# Patient Record
Sex: Female | Born: 1945
Health system: Southern US, Community
[De-identification: ages and names within clinical notes are randomized; demographics above are authoritative.]

## PROBLEM LIST (undated history)

## (undated) DIAGNOSIS — E785 Hyperlipidemia, unspecified: Secondary | ICD-10-CM

## (undated) DIAGNOSIS — K259 Gastric ulcer, unspecified as acute or chronic, without hemorrhage or perforation: Secondary | ICD-10-CM

## (undated) DIAGNOSIS — I10 Essential (primary) hypertension: Secondary | ICD-10-CM

## (undated) DIAGNOSIS — J329 Chronic sinusitis, unspecified: Secondary | ICD-10-CM

## (undated) DIAGNOSIS — B7300 Onchocerciasis with eye involvement, unspecified: Secondary | ICD-10-CM

## (undated) HISTORY — PX: BREAST LUMPECTOMY: SHX2

## (undated) HISTORY — PX: APPENDECTOMY: SHX54

## (undated) HISTORY — DX: Hyperlipidemia, unspecified: E78.5

## (undated) HISTORY — DX: Gastric ulcer, unspecified as acute or chronic, without hemorrhage or perforation: K25.9

---

## 2015-03-09 ENCOUNTER — Ambulatory Visit: Payer: Self-pay | Attending: Internal Medicine

## 2015-07-11 ENCOUNTER — Ambulatory Visit: Payer: Self-pay | Attending: Family Medicine

## 2015-08-30 ENCOUNTER — Encounter (HOSPITAL_COMMUNITY): Payer: Self-pay | Admitting: *Deleted

## 2015-08-30 ENCOUNTER — Emergency Department (INDEPENDENT_AMBULATORY_CARE_PROVIDER_SITE_OTHER)
Admission: EM | Admit: 2015-08-30 | Discharge: 2015-08-30 | Disposition: A | Discharge status: Home / Self Care | Payer: No Typology Code available for payment source | Source: Home / Self Care | Attending: Emergency Medicine | Admitting: Emergency Medicine

## 2015-08-30 DIAGNOSIS — I10 Essential (primary) hypertension: Secondary | ICD-10-CM

## 2015-08-30 HISTORY — DX: Onchocerciasis with eye involvement, unspecified: B73.00

## 2015-08-30 HISTORY — DX: Essential (primary) hypertension: I10

## 2015-08-30 HISTORY — DX: Chronic sinusitis, unspecified: J32.9

## 2015-08-30 LAB — POCT I-STAT, CHEM 8
BUN: 12 mg/dL (ref 6–20)
CHLORIDE: 105 mmol/L (ref 101–111)
CREATININE: 0.9 mg/dL (ref 0.44–1.00)
Calcium, Ion: 1.15 mmol/L (ref 1.13–1.30)
Glucose, Bld: 110 mg/dL — ABNORMAL HIGH (ref 65–99)
HEMATOCRIT: 47 % — AB (ref 36.0–46.0)
HEMOGLOBIN: 16 g/dL — AB (ref 12.0–15.0)
POTASSIUM: 3.5 mmol/L (ref 3.5–5.1)
Sodium: 143 mmol/L (ref 135–145)
TCO2: 27 mmol/L (ref 0–100)

## 2015-08-30 MED ORDER — CLONIDINE HCL 0.1 MG PO TABS
ORAL_TABLET | ORAL | Status: AC
  Filled 2015-08-30: qty 1

## 2015-08-30 MED ORDER — CLONIDINE HCL 0.1 MG PO TABS
0.1000 mg | ORAL_TABLET | Freq: Once | ORAL | Status: AC
  Administered 2015-08-30: 0.1 mg via ORAL

## 2015-08-30 MED ORDER — AMLODIPINE BESYLATE 10 MG PO TABS: 10.0000 mg | ORAL_TABLET | Freq: Every day | ORAL | Status: DC

## 2015-08-30 NOTE — Discharge Instructions (Signed)
I am adjusting your blood pressure medicines. Please continue to take the enalapril daily.  I think the clonidine as causing your palpitations, the jogging of your heart. Take your regular dose tonight. Starting tomorrow, take half a tablet twice a day for 1 week, then half a tablet once a day for a week, then stop.  Please start amlodipine 1 pill daily.  Continue to monitor your blood pressure at home. If it is greater than 180/110 most of the time, please come back. If your heart rate drops below 50, please come back.  If at any time you develop chest pain, the shaking of your heart gets worse, you develop a really bad headache, or you have trouble with your vision, please go to the emergency room.

## 2015-08-30 NOTE — ED Notes (Signed)
Pt reports she recently started taking clonidine about 4 months in addition to enalipril.  Since taking the clonidine she has felt "shaking" in her chest especially when lying on her left side,    She has a headache the last 2 days

## 2015-08-30 NOTE — ED Provider Notes (Signed)
CSN: [id number]     Arrival date & time 08/30/15  1317 History   First MD Initiated Contact with Patient 08/30/15 1436     Chief Complaint  Patient presents with  . Medication Refill   (Consider location/radiation/quality/duration/timing/severity/associated sxs/prior Treatment) HPI  She is a 70 year old woman here for medication issue. She states she has been on blood pressure medication for at least 30 years. Her current medications include enalapril 20 mg daily and clonidine. The clonidine was started several months ago and has gradually been increased in dosage. Currently she is taking it twice a day, the dose is unclear but I suspect she is on 0.1 mg twice a day. She states that when she started the clonidine she developed brief episodes of her heart "jogging." These lasted a few seconds to minutes and would resolve spontaneously. When the clonidine dosage increased to twice a day, the palpitations got worse. Today, she has not taken her clonidine as she has noticed her pulse rate has been dropping which makes her uncomfortable. She does record her blood pressure and pulse at home. Blood pressure readings are all greater than 160/80. Pulse rate ranges from 55-69. She states the jogging of her heart gets worse when she lies down, particularly on the left side.  She does report some headaches. No shortness of breath or chest pain.  Past Medical History  Diagnosis Date  . Hypertension   . River blindness    Past Surgical History  Procedure Laterality Date  . Breast lumpectomy    . Appendectomy     No family history on file. Social History  Substance Use Topics  . Smoking status: Never Smoker   . Smokeless tobacco: None  . Alcohol Use: No   OB History    No data available     Review of Systems As in history of present illness Allergies  Augmentin  Home Medications   Prior to Admission medications   Medication Sig Start Date End Date Taking? Authorizing Provider  aspirin 81 MG  tablet Take 81 mg by mouth daily.   Yes Historical Provider, MD  cloNIDine (CATAPRES) 0.1 MG tablet Take 0.1 mg by mouth 2 (two) times daily.   Yes Historical Provider, MD  enalapril (VASOTEC) 20 MG tablet Take 20 mg by mouth daily.   Yes Historical Provider, MD  amLODipine (NORVASC) 10 MG tablet Take 1 tablet (10 mg total) by mouth daily. 08/30/15   Melony Overly, MD   Meds Ordered and Administered this Visit   Medications  cloNIDine (CATAPRES) tablet 0.1 mg (not administered)    BP 245/107 mmHg  Pulse 110  Temp(Src) 99.4 F (37.4 C) (Oral)  Resp 24  SpO2 100% No data found.   Physical Exam  Constitutional: She is oriented to person, place, and time. She appears well-developed and well-nourished. No distress.  Cardiovascular: Regular rhythm.   Murmur (1/6 systolic murmur at left upper sternal border) heard. Tachycardic  Pulmonary/Chest: Effort normal and breath sounds normal. No respiratory distress. She has no wheezes. She has no rales.  Neurological: She is alert and oriented to person, place, and time.    ED Course  Procedures (including critical care time) ED ECG REPORT   Date: 08/30/2015  Rate: 100  Rhythm: normal sinus rhythm  QRS Axis: normal  Intervals: normal  ST/T Wave abnormalities: normal  Conduction Disutrbances:none  Narrative Interpretation: NSR, normal EKG  Old EKG Reviewed: none available  I have personally reviewed the EKG tracing and agree with the  computerized printout as noted.  Labs Review Labs Reviewed  POCT I-STAT, CHEM 8 - Abnormal; Notable for the following:    Glucose, Bld 110 (*)    Hemoglobin 16.0 (*)    HCT 47.0 (*)    All other components within normal limits    Imaging Review No results found.    MDM   1. Essential hypertension    Her blood pressure is quite elevated. I suspect this is rebound hypertension as she did not take her clonidine this morning. We will give her clonidine 0.1 mg here prior to discharge. I suspect  her palpitations are secondary to clonidine given that they started after starting the medication and worsened when the dose increased. I have provided her instructions on slowly tapering the clonidine. She will continue her enalapril. I have added amlodipine 10 mg daily. I have given her parameters that warrant return to the urgent care as well as reasons to go to the emergency room.  Melony Overly, MD 08/30/15 [phone]

## 2015-08-31 ENCOUNTER — Telehealth (HOSPITAL_COMMUNITY): Payer: Self-pay | Admitting: Emergency Medicine

## 2015-08-31 NOTE — ED Notes (Signed)
Returned pt's call... Pt reports she forgot to mention a problem to the doctor yest and wanted to know if we can call in a Rx to help her sleep.  Notified pt that Dr. Bridgett Larsson is not here... Adv pt to f/u w/her PCP for this as well and to return if not getting any better A&O x4... No acute distress.

## 2015-09-07 ENCOUNTER — Telehealth (HOSPITAL_COMMUNITY): Payer: Self-pay | Admitting: Emergency Medicine

## 2015-09-07 NOTE — ED Notes (Signed)
Pt reports she's still feeling dizzy and having palpitations.... Per Dr. Bridgett Larsson, pt is to come in or f/u w/PCP Adv pt to come... Reports she can't come in today due to lack of transportation and will be here tomorrow Adv pt to call EMS and go to ER if sx are getting worse Pt verb understanding.

## 2015-09-08 ENCOUNTER — Emergency Department (HOSPITAL_COMMUNITY)
Admission: EM | Admit: 2015-09-08 | Discharge: 2015-09-08 | Disposition: A | Discharge status: Home / Self Care | Payer: No Typology Code available for payment source | Source: Home / Self Care

## 2015-09-24 ENCOUNTER — Emergency Department (INDEPENDENT_AMBULATORY_CARE_PROVIDER_SITE_OTHER)
Admission: EM | Admit: 2015-09-24 | Discharge: 2015-09-24 | Disposition: A | Discharge status: Home / Self Care | Payer: No Typology Code available for payment source | Source: Home / Self Care | Attending: Family Medicine | Admitting: Family Medicine

## 2015-09-24 ENCOUNTER — Encounter (HOSPITAL_COMMUNITY): Payer: Self-pay | Admitting: Emergency Medicine

## 2015-09-24 DIAGNOSIS — Z8679 Personal history of other diseases of the circulatory system: Secondary | ICD-10-CM

## 2015-09-24 DIAGNOSIS — Z87898 Personal history of other specified conditions: Secondary | ICD-10-CM

## 2015-09-24 MED ORDER — TRAZODONE HCL 50 MG PO TABS: 50.0000 mg | ORAL_TABLET | Freq: Every day | ORAL | Status: DC

## 2015-09-24 NOTE — ED Provider Notes (Signed)
CSN: [id number]     Arrival date & time 09/24/15  1601 History   First MD Initiated Contact with Patient 09/24/15 1743     Chief Complaint  Patient presents with  . Palpitations   (Consider location/radiation/quality/duration/timing/severity/associated sxs/prior Treatment) HPI patient presents with complaint of palpitations due to her medications. Patient states that she saw one of the urgent care doctors here who adjusted her medication and she is very appreciative of that but she still has symptoms and would like to have something to help her sleep at night. Patient states that she is also having some psychological issues because she has lost her green card and she is afraid that she will be deported. States that she has had multiple losses and her family due to war in her country. And does not wish to return. She states that she is not happy with her primary care provider because she does not feel that she listens to her. She is requesting a sleeping pill at this time. She is also asking to see the urgent care physician Cared for her previously. Patient describes her palpitations as "her heart is jogging" does not give her chest pain or shortness of breath. She has had her medication adjusted and has been weaned off her clonidine which she finished February 16th by patient's report.  Past Medical History  Diagnosis Date  . Hypertension   . River blindness   . Sinusitis    Past Surgical History  Procedure Laterality Date  . Breast lumpectomy    . Appendectomy     History reviewed. No pertinent family history. Social History  Substance Use Topics  . Smoking status: Never Smoker   . Smokeless tobacco: None  . Alcohol Use: No   OB History    No data available     Review of Systems palpitations Allergies  Augmentin  Home Medications   Prior to Admission medications   Medication Sig Start Date End Date Taking? Authorizing Provider  amLODipine (NORVASC) 10 MG tablet Take 1 tablet  (10 mg total) by mouth daily. 08/30/15  Yes Melony Overly, MD  aspirin 81 MG tablet Take 81 mg by mouth daily.   Yes Historical Provider, MD  enalapril (VASOTEC) 20 MG tablet Take 20 mg by mouth daily.   Yes Historical Provider, MD  cloNIDine (CATAPRES) 0.1 MG tablet Take 0.1 mg by mouth 2 (two) times daily.    Historical Provider, MD  traZODone (DESYREL) 50 MG tablet Take 1 tablet (50 mg total) by mouth at bedtime. 09/24/15   Konrad Felix, PA   Meds Ordered and Administered this Visit  Medications - No data to display  BP 187/82 mmHg  Pulse 109  Temp(Src) 98.4 F (36.9 C) (Oral)  Resp 18  SpO2 98% No data found.   Physical Exam NURSES NOTES AND VITAL SIGNS REVIEWED. CONSTITUTIONAL: Well developed, well nourished, no acute distress HEENT: normocephalic, atraumatic, right and left TM's are normal EYES: Conjunctiva normal NECK:normal ROM, supple, no adenopathy PULMONARY:No respiratory distress, normal effort, Lungs: CTAb/l, no wheezes, or increased work of breathing CARDIOVASCULAR: RRR, no murmur ABDOMEN: soft, ND, NT, +'ve BS MUSCULOSKELETAL: Normal ROM of all extremities,  SKIN: warm and dry without rash PSYCHIATRIC: Mood and affect, behavior are normal  ED Course  Procedures (including critical care time)  Labs Review Labs Reviewed - No data to display  Imaging Review No results found.   Visual Acuity Review  Right Eye Distance:   Left Eye Distance:   Bilateral  Distance:    Right Eye Near:   Left Eye Near:    Bilateral Near:         MDM   1. History of palpitations    Prescription for trazodone is provided. Patient is advised she needs to discuss her blood pressure medicines with Dr. Bridgett Larsson. Messages left for Dr. Bridgett Larsson to maker her aware of this patient.    Konrad Felix, PA 09/24/15 2102

## 2015-09-24 NOTE — Discharge Instructions (Signed)
Dr. Bridgett Larsson will be on duty tomorrow. Since she is managing your blood pressure, I think it is best for her to change your blood pressure medicine.  I am happy to add a medication that may help you sleep, so please speak to the Dr. About this also.  Although your blood pressures are elevated they are not dangerously high at this time.   It is a good thing that you keep up with your blood pressure so well.   I  will leave Dr. Bridgett Larsson a message that you will call tomorrow.

## 2015-09-24 NOTE — ED Notes (Signed)
Pt is still experiencing palpitations along with loss of appetite, insomnia and weight loss.

## 2015-09-25 MED ORDER — LISINOPRIL-HYDROCHLOROTHIAZIDE 10-12.5 MG PO TABS: 1.0000 | ORAL_TABLET | Freq: Every day | ORAL | Status: DC

## 2015-09-25 NOTE — ED Provider Notes (Signed)
I have called and spoken with the patient regarding her blood pressure and palpitations.  I discussed her that the Urgent Care is not able to provide primary care services.  She has agreed to speak with Maggy for assistance in finding a new primary care doctor.  In the meantime, I will help with her blood pressure management. She does state that she took the trazodone that she was prescribed yesterday and had her first good night sleep in quite some time. She states since being off the clonidine some of her symptoms are better, but she continues to have palpitations, typically after taking her blood pressure medicines. She states her pulse has been in the 70s and 80s at home her blood pressure has been mostly in the 0000000 systolic.  I discussed with her that she will likely end up needing to see a cardiologist for the palpitations as her EKG here was normal. As an urgent care center we are not able to arrange that appointment, but we will provide assistance in finding a PCP. In the meantime, I have adjusted her medications. She will remain on amlodipine 10 mg daily. I have changed her enalapril to lisinopril-HCTZ 10-12.5 mg once a day.  I have requested that she follow up here in one week so we can recheck blood work and her blood pressure.  Melony Overly, MD 09/25/15 [phone]

## 2015-10-01 ENCOUNTER — Encounter (HOSPITAL_COMMUNITY): Payer: Self-pay | Admitting: *Deleted

## 2015-10-01 ENCOUNTER — Emergency Department (INDEPENDENT_AMBULATORY_CARE_PROVIDER_SITE_OTHER)
Admission: EM | Admit: 2015-10-01 | Discharge: 2015-10-01 | Disposition: A | Discharge status: Home / Self Care | Payer: No Typology Code available for payment source | Source: Home / Self Care | Attending: Family Medicine | Admitting: Family Medicine

## 2015-10-01 DIAGNOSIS — E119 Type 2 diabetes mellitus without complications: Secondary | ICD-10-CM

## 2015-10-01 DIAGNOSIS — E669 Obesity, unspecified: Secondary | ICD-10-CM

## 2015-10-01 DIAGNOSIS — I10 Essential (primary) hypertension: Secondary | ICD-10-CM

## 2015-10-01 DIAGNOSIS — E1169 Type 2 diabetes mellitus with other specified complication: Secondary | ICD-10-CM

## 2015-10-01 LAB — POCT I-STAT, CHEM 8
BUN: 13 mg/dL (ref 6–20)
CREATININE: 1 mg/dL (ref 0.44–1.00)
Calcium, Ion: 1.13 mmol/L (ref 1.13–1.30)
Chloride: 101 mmol/L (ref 101–111)
Glucose, Bld: 170 mg/dL — ABNORMAL HIGH (ref 65–99)
HEMATOCRIT: 42 % (ref 36.0–46.0)
Hemoglobin: 14.3 g/dL (ref 12.0–15.0)
POTASSIUM: 3.6 mmol/L (ref 3.5–5.1)
SODIUM: 140 mmol/L (ref 135–145)
TCO2: 29 mmol/L (ref 0–100)

## 2015-10-01 NOTE — Discharge Instructions (Signed)
Continue your medicine, your must get a doctor to manage your bp and your sugar problems,

## 2015-10-01 NOTE — ED Provider Notes (Signed)
CSN: [id number]     Arrival date & time 10/01/15  1304 History   First MD Initiated Contact with Patient 10/01/15 1334     Chief Complaint  Patient presents with  . Follow-up   (Consider location/radiation/quality/duration/timing/severity/associated sxs/prior Treatment) Patient is a 70 y.o. female presenting with hypertension.  Hypertension This is a recurrent problem. The current episode started more than 1 week ago. The problem has not changed since onset.Pertinent negatives include no chest pain. Associated symptoms comments: C/o wt loss, palpitations, side effects to meds. The symptoms are aggravated by stress.    Past Medical History  Diagnosis Date  . Hypertension   . River blindness   . Sinusitis   . Gastric ulcer    Past Surgical History  Procedure Laterality Date  . Breast lumpectomy    . Appendectomy     Family History  Problem Relation Age of Onset  . Heart disease Mother    Social History  Substance Use Topics  . Smoking status: Never Smoker   . Smokeless tobacco: None  . Alcohol Use: No   OB History    No data available     Review of Systems  Respiratory: Negative.   Cardiovascular: Positive for palpitations. Negative for chest pain and leg swelling.  Gastrointestinal: Negative.   Genitourinary: Negative.   All other systems reviewed and are negative.   Allergies  Shellfish allergy; Augmentin; and Lisinopril-hydrochlorothiazide  Home Medications   Prior to Admission medications   Medication Sig Start Date End Date Taking? Authorizing Provider  amLODipine (NORVASC) 5 MG tablet Take 1 tablet (5 mg total) by mouth daily. 10/02/15   Maren Reamer, MD  aspirin 81 MG tablet Take 81 mg by mouth daily. Reported on 10/02/2015    Historical Provider, MD  enalapril (VASOTEC) 5 MG tablet Take 1 tablet (5 mg total) by mouth daily. 10/10/15   Maren Reamer, MD  ivermectin (STROMECTOL) 3 MG TABS tablet Take 150 mcg/kg by mouth once. Reported on 10/10/2015     Historical Provider, MD  metoprolol tartrate (LOPRESSOR) 25 MG tablet Take 1 tablet (25 mg total) by mouth 2 (two) times daily. 10/02/15   Maren Reamer, MD  pantoprazole (PROTONIX) 40 MG tablet Take 1 tablet (40 mg total) by mouth daily. 10/02/15   Maren Reamer, MD  traZODone (DESYREL) 50 MG tablet Take 1 tablet (50 mg total) by mouth at bedtime. 10/10/15   Maren Reamer, MD   Meds Ordered and Administered this Visit  Medications - No data to display  BP 170/86 mmHg  Pulse 117  Temp(Src) 98.3 F (36.8 C) (Oral)  Resp 18  SpO2 99% No data found.   Physical Exam  Constitutional: She is oriented to person, place, and time. She appears well-developed and well-nourished. No distress.  HENT:  Mouth/Throat: Oropharynx is clear and moist.  Neck: Normal range of motion. Neck supple.  Cardiovascular: Normal rate, regular rhythm, normal heart sounds and intact distal pulses.   Pulmonary/Chest: Effort normal and breath sounds normal.  Lymphadenopathy:    She has no cervical adenopathy.  Neurological: She is alert and oriented to person, place, and time.  Skin: Skin is warm and dry.  Nursing note and vitals reviewed.   ED Course  Procedures (including critical care time)  Labs Review Labs Reviewed  POCT I-STAT, CHEM 8 - Abnormal; Notable for the following:    Glucose, Bld 170 (*)    All other components within normal limits   i-stat bs  170. Imaging Review No results found.   Visual Acuity Review  Right Eye Distance:   Left Eye Distance:   Bilateral Distance:    Right Eye Near:   Left Eye Near:    Bilateral Near:         MDM   1. Diabetes mellitus type 2 in obese ([place])   2. Essential hypertension        Billy Fischer, MD 10/12/15 2112

## 2015-10-01 NOTE — ED Notes (Addendum)
Pt  Reports    Was  Told  To       followup   For     Medication  Review  And  Possible  Blood  Work       Seen  ucc     1  Week  Ago      pt  Reports  She  Is     Only  Taking  Half  Of   Her    enalopril       Pt  Also  Reports  Reports    Sensations  Of  palpatations

## 2015-10-02 ENCOUNTER — Ambulatory Visit: Payer: No Typology Code available for payment source | Attending: Internal Medicine | Admitting: Internal Medicine

## 2015-10-02 ENCOUNTER — Encounter: Payer: Self-pay | Admitting: Internal Medicine

## 2015-10-02 ENCOUNTER — Other Ambulatory Visit: Payer: Self-pay

## 2015-10-02 VITALS — BP 182/91 | HR 110 | Temp 98.1°F | Resp 16 | Ht 64.0 in | Wt 171.0 lb

## 2015-10-02 DIAGNOSIS — Z888 Allergy status to other drugs, medicaments and biological substances status: Secondary | ICD-10-CM | POA: Insufficient documentation

## 2015-10-02 DIAGNOSIS — Z0000 Encounter for general adult medical examination without abnormal findings: Secondary | ICD-10-CM

## 2015-10-02 DIAGNOSIS — Z79899 Other long term (current) drug therapy: Secondary | ICD-10-CM | POA: Insufficient documentation

## 2015-10-02 DIAGNOSIS — R002 Palpitations: Secondary | ICD-10-CM

## 2015-10-02 DIAGNOSIS — K2100 Gastro-esophageal reflux disease with esophagitis, without bleeding: Secondary | ICD-10-CM

## 2015-10-02 DIAGNOSIS — Z131 Encounter for screening for diabetes mellitus: Secondary | ICD-10-CM

## 2015-10-02 DIAGNOSIS — H269 Unspecified cataract: Secondary | ICD-10-CM | POA: Insufficient documentation

## 2015-10-02 DIAGNOSIS — Z8719 Personal history of other diseases of the digestive system: Secondary | ICD-10-CM | POA: Insufficient documentation

## 2015-10-02 DIAGNOSIS — I10 Essential (primary) hypertension: Secondary | ICD-10-CM

## 2015-10-02 DIAGNOSIS — Z880 Allergy status to penicillin: Secondary | ICD-10-CM | POA: Insufficient documentation

## 2015-10-02 DIAGNOSIS — Z7982 Long term (current) use of aspirin: Secondary | ICD-10-CM | POA: Insufficient documentation

## 2015-10-02 DIAGNOSIS — R739 Hyperglycemia, unspecified: Secondary | ICD-10-CM

## 2015-10-02 DIAGNOSIS — G4700 Insomnia, unspecified: Secondary | ICD-10-CM | POA: Insufficient documentation

## 2015-10-02 LAB — POCT GLYCOSYLATED HEMOGLOBIN (HGB A1C): Hemoglobin A1C: 5.4

## 2015-10-02 LAB — BASIC METABOLIC PANEL
BUN: 12 mg/dL (ref 7–25)
CHLORIDE: 100 mmol/L (ref 98–110)
CO2: 23 mmol/L (ref 20–31)
Calcium: 9.2 mg/dL (ref 8.6–10.4)
Creat: 0.9 mg/dL (ref 0.60–0.93)
GLUCOSE: 92 mg/dL (ref 65–99)
POTASSIUM: 3.7 mmol/L (ref 3.5–5.3)
Sodium: 137 mmol/L (ref 135–146)

## 2015-10-02 LAB — LIPID PANEL
CHOLESTEROL: 279 mg/dL — AB (ref 125–200)
HDL: 83 mg/dL (ref >=46)
LDL Cholesterol: 184 mg/dL — ABNORMAL HIGH (ref <130)
TRIGLYCERIDES: 58 mg/dL (ref <150)
Total CHOL/HDL Ratio: 3.4 Ratio (ref <=5.0)
VLDL: 12 mg/dL (ref <30)

## 2015-10-02 LAB — TSH: TSH: 1.46 m[IU]/L

## 2015-10-02 MED ORDER — AMLODIPINE BESYLATE 5 MG PO TABS: 5.0000 mg | ORAL_TABLET | Freq: Every day | ORAL | Status: DC

## 2015-10-02 MED ORDER — METOPROLOL TARTRATE 25 MG PO TABS: 25.0000 mg | ORAL_TABLET | Freq: Two times a day (BID) | ORAL | Status: DC

## 2015-10-02 MED ORDER — PANTOPRAZOLE SODIUM 40 MG PO TBEC: 40.0000 mg | DELAYED_RELEASE_TABLET | Freq: Every day | ORAL | Status: DC

## 2015-10-02 MED FILL — ?AMLODIPINE BESYLATE 5 MG T: 5 | 30 days supply | Qty: 30 | Fill #0

## 2015-10-02 MED FILL — ?METOPROLOL 25 MG TABLET: 25 | 30 days supply | Qty: 60 | Fill #0

## 2015-10-02 MED FILL — ?PANTOPRAZOLE SOD DR 40MG: 40 MG | 30 days supply | Qty: 30 | Fill #0

## 2015-10-02 NOTE — Progress Notes (Signed)
Theresa [name], is a 70 y.o. female  [id number]  [id number]  DOB - [birth year]  CC:  Chief Complaint  Patient presents with  . Hypertension  . Diabetes  . Follow-up       HPI: Theresa [name] is a 70 y.o. female here today to establish medical care.  Saw urgent care yesterday for bp check as well, found to have elavated glucose 170 at time.  Pt has had long standing hx of htn x 40years.  Use to be on 5mg  enalapril in [place], came to [place] August.  Since than has had numerous medication adjustments for her bp.   Recently started on enalapril 10/12.5 but currently not taking due to ?side effects ?flushing/insomnia.  Also was started on clonidine in past, but recently titrated off on 2/16 due to concerns for s/e as well.  She was on norvasc 10 but weaned down to 5mg  recently 2nd due to concerns of possible s/e's as well.   She also stopped taking the asa 81 bc not sure what it was for.  Interested in coming to our clinic for pcp care.  granddgt in room w/ her.  Diet has been limiting salt for last 1 month as well, but they do use spices at home that also has unquantifiable amt of salt in it.  Patient has No headache, No chest pain, No abdominal pain - No Nausea, No new weakness tingling or numbness, No Cough - SOB.  Co of intermmittant palpitations, but no cp.  Was on bb (labetolol for abt 12 years years ago), but states was taking off b/c of low hr (50s).  Co of constant throat burning, non-radiating, stays in her throat, for last 4-5days, and sometimes ruq abd pain after taking her meds.     Allergies  Allergen Reactions  . Augmentin [Amoxicillin-Pot Clavulanate]   . Lisinopril-Hydrochlorothiazide    Past Medical History  Diagnosis Date  . Hypertension   . River blindness   . Sinusitis    Current Outpatient Prescriptions on File Prior to Visit  Medication Sig Dispense Refill  . aspirin 81 MG tablet Take 81 mg by mouth daily. Reported on 10/02/2015    . traZODone  (DESYREL) 50 MG tablet Take 1 tablet (50 mg total) by mouth at bedtime. (Patient not taking: Reported on 10/02/2015) 5 tablet 0  . [DISCONTINUED] enalapril (VASOTEC) 20 MG tablet Take 20 mg by mouth daily.     No current facility-administered medications on file prior to visit.   No family history on file. Social History   Social History  . Marital Status: Widowed    Spouse Name: N/A  . Number of Children: N/A  . Years of Education: N/A   Occupational History  . Not on file.   Social History Main Topics  . Smoking status: Never Smoker   . Smokeless tobacco: Not on file  . Alcohol Use: No  . Drug Use: No  . Sexual Activity: Yes    Birth Control/ Protection: Post-menopausal   Other Topics Concern  . Not on file   Social History Narrative    Review of Systems: Constitutional: Negative for fever, chills, diaphoresis, activity change, appetite change and fatigue. HENT: Negative for ear pain, nosebleeds, congestion, facial swelling, rhinorrhea, neck pain, neck stiffness and ear discharge.  + left ear pain at times, concern if may be causing neck pains. Eyes: Negative for pain, discharge, redness, itching and visual disturbance. Respiratory: Negative for cough, choking, chest tightness, shortness of breath, wheezing and  stridor.  Cardiovascular: Negative for chest pain and leg swelling.  +INTERMITTANT PALPITATIONS. Gastrointestinal: Negative for abdominal distention. Genitourinary: Negative for dysuria, urgency, frequency, hematuria, flank pain, decreased urine volume, difficulty urinating and dyspareunia.    Musculoskeletal: Negative for back pain, joint swelling, arthralgia and gait problem.  Sometimes gets left arm pain when sleeps on it wrong. Neurological: Negative for dizziness, tremors, seizures, syncope, facial asymmetry, speech difficulty, weakness, light-headedness, numbness and headaches.   +INSOMNIA BETTER SINCE DECREASE NORVASC. Hematological: Negative for adenopathy.  Does not bruise/bleed easily. Psychiatric/Behavioral: Negative for hallucinations, behavioral problems, confusion, dysphoric mood, decreased concentration and agitation.    Objective:   Filed Vitals:   10/02/15 1054  BP: 182/91  Pulse: 110  Temp: 98.1 F (36.7 C)  Resp: 16    Physical Exam: Constitutional: Patient appears well-developed and well-nourished. No distress. HENT: Normocephalic, atraumatic, External /internal right and left ear normal. cerumin noted in left ear.  tm clear bilaterally. Oropharynx is clear and moist.  No thrush noted. Eyes: Conjunctivae and EOM are normal. PERRL, no scleral icterus. Neck: Normal ROM. Neck supple. No JVD. No tracheal deviation. No thyromegaly. CVS: RRR, S1/S2 +, no murmurs, no gallops, no carotid bruit.  Pulmonary: Effort and breath sounds normal, no stridor, rhonchi, wheezes, rales.  Abdominal: Soft. BS +, no distension, tenderness, rebound or guarding.  Musculoskeletal: Normal range of motion. No edema and no tenderness.  Lymphadenopathy: No lymphadenopathy noted, cervical, inguinal or axillary Neuro: Alert. Normal reflexes, muscle tone coordination. No cranial nerve deficit. Skin: Skin is warm and dry. No rash noted. Not diaphoretic. No erythema. No pallor. Psychiatric: Normal mood and affect. Behavior, judgment, thought content normal.  Lab Results  Component Value Date   HGB 14.3 10/01/2015   HCT 42.0 10/01/2015   Lab Results  Component Value Date   CREATININE 1.00 10/01/2015   BUN 13 10/01/2015   NA 140 10/01/2015   K 3.6 10/01/2015   CL 101 10/01/2015    Lab Results  Component Value Date   HGBA1C 5.4 10/02/2015   Lipid Panel  No results found for: CHOL, TRIG, HDL, CHOLHDL, VLDL, LDLCALC    ekg 08/30/15 personally reviewed, nsr, 100bpm, possible lae.  No acute st changes.  Assessment and plan:   1. HTN (hypertension), accellerated -given her vague c/o of s/es, not certain which meds to attribute which s/e, also  doubt clonidine rebound since has been weaned down since 2/16... Also meds enalapril/hctz recently started she really never took b/c of s/e concerns. Will adjust meds as follows. - metoprolol tartrate (LOPRESSOR) 25 MG tablet; Take 1 tablet (25 mg total) by mouth 2 (two) times daily.  Dispense: 180 tablet; Refill: 3 - amLODipine (NORVASC) 5 MG tablet; Take 1 tablet (5 mg total) by mouth daily.  Dispense: 90 tablet; Refill: 3 - encouraged to resume asa81, risk/benefits disccussed. - fw w/ [facility] in 1 wk for bp titration.  2. Insomnia Resolved, not taking trazodone anymore.  Attributes it to maybe high dose norvasc 10, which she is now taking only 5mg .  3. Palpitations with tachycardia - ?anxieity vs associated w/ stressors of htn, etc. - ekg personally reviewed by me (done on 08/30/15), no co of cp/doe/le edema currently. Will start bb and follow.  4. Hyperglycemia 3/6 - resolved today -a1c 5.4  5. Gerd/esophagitis.  - trial ppi/protonix 40 qd.  - per pt, hx of ?stomach ulcers and takes meclizine?  6. 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  7. Screening for diabetes mellitus - POCT A1C 5.4, no dm.  - encouraged healthy diet/exercise, ppwk given for education.  8. Healthcare maintenance -will chk fasting lipid panel, cbc, tsh and bmp today - decline flu shot at this time  9. Social - appt w financial services for ppwk.  Fu w/ me in 1 month for annual physical.  The patient was given clear instructions to go to ER or return to medical center if symptoms don't improve, worsen or new problems develop. The patient verbalized understanding. The patient was told to call to get lab results if they haven't heard anything in the next week.    dw pt/granddaughter poc and amendable to above plan.   Maren Reamer, MD, [place], [place]   10/02/2015, 11:26 AM

## 2015-10-02 NOTE — Patient Instructions (Addendum)
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 Your arteries are the blood vessels that carry blood from your heart throughout your body. A blood pressure reading consists of a higher number over a lower number, such as 110/72. The higher number (systolic) is the pressure inside your arteries when your heart pumps. The lower number (diastolic) is the pressure inside your arteries when your heart relaxes. Ideally you want your blood pressure below 120/80. Hypertension forces your heart to work harder to pump blood. Your arteries may become narrow or stiff. Having untreated or uncontrolled hypertension can cause heart attack, stroke, kidney disease, and other problems. RISK FACTORS Some risk factors for high blood pressure are controllable. Others are not.  Risk factors you cannot control include:   Race. You may be at higher risk if you are African American.  Age. Risk increases with age.  Gender. Men are at higher risk than women before age 80 years. After age 19, women are at higher risk than men. Risk factors you can control include:  Not getting enough exercise or physical activity.  Being overweight.  Getting too much fat, sugar, calories, or salt in your diet.  Drinking too much alcohol. SIGNS AND SYMPTOMS Hypertension does not usually cause signs or symptoms. Extremely high blood pressure (hypertensive crisis) may cause headache, anxiety, shortness of breath, and nosebleed. DIAGNOSIS To check if you have hypertension, your health care provider will measure your blood pressure while you are seated, with your arm held at the level of your heart. It should be measured at least twice using the same arm. Certain conditions can cause a difference in blood pressure between your right and left arms. A blood  pressure reading that is higher than normal on one occasion does not mean that you need treatment. If it is not clear whether you have high blood pressure, you may be asked to return on a different day to have your blood pressure checked again. Or, you may be asked to monitor your blood pressure at home for 1 or more weeks. TREATMENT Treating high blood pressure includes making lifestyle changes and possibly taking medicine. Living a healthy lifestyle can help lower high blood pressure. You may need to change some of your habits. Lifestyle changes may include:  Following the DASH diet. This diet is high in fruits, vegetables, and whole grains. It is low in salt, red meat, and added sugars.  Keep your sodium intake below 2,300 mg per day.  Getting at least 30-45 minutes of aerobic exercise at least 4 times per week.  Losing weight if necessary.  Not smoking.  Limiting alcoholic beverages.  Learning ways to reduce stress. Your health care provider may prescribe medicine if lifestyle changes are not enough to get your blood pressure under control, and if one of the following is true:  You are 12-58 years of age and your systolic blood pressure is above 140.  You are 51 years of age or older, and your systolic blood pressure is above 150.  Your diastolic blood pressure is above 90.  You have diabetes, and your systolic blood pressure is over [id number] or your diastolic blood pressure is over 90.  You have kidney disease and your blood pressure is above 140/90.  You have heart disease and your blood pressure is  above 140/90. Your personal target blood pressure may vary depending on your medical conditions, your age, and other factors. HOME CARE INSTRUCTIONS  Have your blood pressure rechecked as directed by your health care provider.   Take medicines only as directed by your health care provider. Follow the directions carefully. Blood pressure medicines must be taken as prescribed. The  medicine does not work as well when you skip doses. Skipping doses also puts you at risk for problems.  Do not smoke.   Monitor your blood pressure at home as directed by your health care provider. SEEK MEDICAL CARE IF:   You think you are having a reaction to medicines taken.  You have recurrent headaches or feel dizzy.  You have swelling in your ankles.  You have trouble with your vision. SEEK IMMEDIATE MEDICAL CARE IF:  You develop a severe headache or confusion.  You have unusual weakness, numbness, or feel faint.  You have severe chest or abdominal pain.  You vomit repeatedly.  You have trouble breathing. MAKE SURE YOU:   Understand these instructions.  Will watch your condition.  Will get help right away if you are not doing well or get worse.   This information is not intended to replace advice given to you by your health care provider. Make sure you discuss any questions you have with your health care provider.   Document Released: 07/14/2005 Document Revised: 11/28/2014 Document Reviewed: 05/06/2013 Elsevier Interactive Patient Education 2016 [place] Many factors influence your heart health, including eating and exercise habits. Heart (coronary) risk increases with abnormal blood fat (lipid) levels. Heart-healthy meal planning includes limiting unhealthy fats, increasing healthy fats, and making other small dietary changes. This includes maintaining a healthy body weight to help keep lipid levels within a normal range. WHAT IS MY PLAN?  Your health care provider recommends that you:  Limit the amount of sodium in your diet to less than ______2000___ mg per day. WHAT TYPES OF FAT SHOULD I CHOOSE?  Choose healthy fats more often. Choose monounsaturated and polyunsaturated fats, such as olive oil and canola oil, flaxseeds, walnuts, almonds, and seeds.  Eat more omega-3 fats. Good choices include salmon, mackerel, sardines,  tuna, flaxseed oil, and ground flaxseeds. Aim to eat fish at least two times each week.  Limit saturated fats. Saturated fats are primarily found in animal products, such as meats, butter, and cream. Plant sources of saturated fats include palm oil, palm kernel oil, and coconut oil.  Avoid foods with partially hydrogenated oils in them. These contain trans fats. Examples of foods that contain trans fats are stick margarine, some tub margarines, cookies, crackers, and other baked goods. WHAT GENERAL GUIDELINES DO I NEED TO FOLLOW?  Check food labels carefully to identify foods with trans fats or high amounts of saturated fat.  Fill one half of your plate with vegetables and green salads. Eat 4-5 servings of vegetables per day. A serving of vegetables equals 1 cup of raw leafy vegetables,  cup of raw or cooked cut-up vegetables, or  cup of vegetable juice.  Fill one fourth of your plate with whole grains. Look for the word "whole" as the first word in the ingredient list.  Fill one fourth of your plate with lean protein foods.  Eat 4-5 servings of fruit per day. A serving of fruit equals one medium whole fruit,  cup of dried fruit,  cup of fresh, frozen, or canned fruit, or  cup of 100%  fruit juice.  Eat more foods that contain soluble fiber. Examples of foods that contain this type of fiber are apples, broccoli, carrots, beans, peas, and barley. Aim to get 20-30 g of fiber per day.  Eat more home-cooked food and less restaurant, buffet, and fast food.  Limit or avoid alcohol.  Limit foods that are high in starch and sugar.  Avoid fried foods.  Cook foods by using methods other than frying. Baking, boiling, grilling, and broiling are all great options. Other fat-reducing suggestions include:  Removing the skin from poultry.  Removing all visible fats from meats.  Skimming the fat off of stews, soups, and gravies before serving them.  Steaming vegetables in water or  broth.  Lose weight if you are overweight. Losing just 5-10% of your initial body weight can help your overall health and prevent diseases such as diabetes and heart disease.  Increase your consumption of nuts, legumes, and seeds to 4-5 servings per week. One serving of dried beans or legumes equals  cup after being cooked, one serving of nuts equals 1 ounces, and one serving of seeds equals  ounce or 1 tablespoon.  You may need to monitor your salt (sodium) intake, especially if you have high blood pressure. Talk with your health care provider or dietitian to get more information about reducing sodium. WHAT FOODS CAN I EAT? Grains Breads, including [place], white, pita, wheat, raisin, rye, oatmeal, and [place]. Tortillas that are neither fried nor made with lard or trans fat. Low-fat rolls, including hotdog and hamburger buns and English muffins. Biscuits. Muffins. Waffles. Pancakes. Light popcorn. Whole-grain cereals. Flatbread. Melba toast. Pretzels. Breadsticks. Rusks. Low-fat snacks and crackers, including oyster, saltine, matzo, graham, animal, and rye. Rice and pasta, including brown rice and those that are made with whole wheat. Vegetables All vegetables. Fruits All fruits, but limit coconut. Meats and Other Protein Sources Lean, well-trimmed beef, veal, pork, and lamb. Chicken and [place] without skin. All fish and shellfish. Wild duck, rabbit, pheasant, and venison. Egg whites or low-cholesterol egg substitutes. Dried beans, peas, lentils, and tofu.Seeds and most nuts. Dairy Low-fat or nonfat cheeses, including ricotta, string, and mozzarella. Skim or 1% milk that is liquid, powdered, or evaporated. Buttermilk that is made with low-fat milk. Nonfat or low-fat yogurt. Beverages Mineral water. Diet carbonated beverages. Sweets and Desserts Sherbets and fruit ices. Honey, jam, marmalade, jelly, and syrups. Meringues and gelatins. Pure sugar candy, such as hard candy, jelly beans,  gumdrops, mints, marshmallows, and small amounts of dark chocolate. [employer] Eat all sweets and desserts in moderation. Fats and Oils Nonhydrogenated (trans-free) margarines. Vegetable oils, including soybean, sesame, sunflower, olive, peanut, safflower, corn, canola, and cottonseed. Salad dressings or mayonnaise that are made with a vegetable oil. Limit added fats and oils that you use for cooking, baking, salads, and as spreads. Other Cocoa powder. Coffee and tea. All seasonings and condiments. The items listed above may not be a complete list of recommended foods or beverages. Contact your dietitian for more options. WHAT FOODS ARE NOT RECOMMENDED? Grains Breads that are made with saturated or trans fats, oils, or whole milk. Croissants. Butter rolls. Cheese breads. Sweet rolls. Donuts. Buttered popcorn. Chow mein noodles. High-fat crackers, such as cheese or butter crackers. Meats and Other Protein Sources Fatty meats, such as hotdogs, short ribs, sausage, spareribs, bacon, ribeye roast or steak, and mutton. High-fat deli meats, such as salami and bologna. Caviar. Domestic duck and goose. Organ meats, such as kidney, liver, sweetbreads, brains, gizzard,  chitterlings, and heart. Dairy Cream, sour cream, cream cheese, and creamed cottage cheese. Whole milk cheeses, including blue (bleu), Monterey Jack, [place], [place], American, [place], [place], [place], [place], and [place]. Whole or 2% milk that is liquid, evaporated, or condensed. Whole buttermilk. Cream sauce or high-fat cheese sauce. Yogurt that is made from whole milk. Beverages Regular sodas and drinks with added sugar. Sweets and Desserts Frosting. Pudding. Cookies. Cakes other than angel food cake. Candy that has milk chocolate or white chocolate, hydrogenated fat, butter, coconut, or unknown ingredients. Buttered syrups. Full-fat ice cream or ice cream drinks. Fats and Oils Gravy that has suet, meat fat, or shortening. Cocoa  butter, hydrogenated oils, palm oil, coconut oil, palm kernel oil. These can often be found in baked products, candy, fried foods, nondairy creamers, and whipped toppings. Solid fats and shortenings, including bacon fat, salt pork, lard, and butter. Nondairy cream substitutes, such as coffee creamers and sour cream substitutes. Salad dressings that are made of unknown oils, cheese, or sour cream. The items listed above may not be a complete list of foods and beverages to avoid. Contact your dietitian for more information.   This information is not intended to replace advice given to you by your health care provider. Make sure you discuss any questions you have with your health care provider.   Document Released: 04/22/2008 Document Revised: 08/04/2014 Document Reviewed: 01/05/2014 Elsevier Interactive Patient Education 2016 Elsevier Inc.  -Insomnia Insomnia is a sleep disorder that makes it difficult to fall asleep or to stay asleep. Insomnia can cause tiredness (fatigue), low energy, difficulty concentrating, mood swings, and poor performance at work or school.  There are three different ways to classify insomnia:  Difficulty falling asleep.  Difficulty staying asleep.  Waking up too early in the morning. Any type of insomnia can be long-term (chronic) or short-term (acute). Both are common. Short-term insomnia usually lasts for three months or less. Chronic insomnia occurs at least three times a week for longer than three months. CAUSES  Insomnia may be caused by another condition, situation, or substance, such as:  Anxiety.  Certain medicines.  Gastroesophageal reflux disease (GERD) or other gastrointestinal conditions.  Asthma or other breathing conditions.  Restless legs syndrome, sleep apnea, or other sleep disorders.  Chronic pain.  Menopause. This may include hot flashes.  Stroke.  Abuse of alcohol, tobacco, or illegal drugs.  Depression.  Caffeine.    Neurological disorders, such as Alzheimer disease.  An overactive thyroid (hyperthyroidism). The cause of insomnia may not be known. RISK FACTORS Risk factors for insomnia include:  Gender. Women are more commonly affected than men.  Age. Insomnia is more common as you get older.  Stress. This may involve your professional or personal life.  Income. Insomnia is more common in people with lower income.  Lack of exercise.   Irregular work schedule or night shifts.  Traveling between different time zones. SIGNS AND SYMPTOMS If you have insomnia, trouble falling asleep or trouble staying asleep is the main symptom. This may lead to other symptoms, such as:  Feeling fatigued.  Feeling nervous about going to sleep.  Not feeling rested in the morning.  Having trouble concentrating.  Feeling irritable, anxious, or depressed. TREATMENT  Treatment for insomnia depends on the cause. If your insomnia is caused by an underlying condition, treatment will focus on addressing the condition. Treatment may also include:   Medicines to help you sleep.  Counseling or therapy.  Lifestyle adjustments. HOME CARE INSTRUCTIONS   Take medicines  only as directed by your health care provider.  Keep regular sleeping and waking hours. Avoid naps.  Keep a sleep diary to help you and your health care provider figure out what could be causing your insomnia. Include:   When you sleep.  When you wake up during the night.  How well you sleep.   How rested you feel the next day.  Any side effects of medicines you are taking.  What you eat and drink.   Make your bedroom a comfortable place where it is easy to fall asleep:  Put up shades or special blackout curtains to block light from outside.  Use a white noise machine to block noise.  Keep the temperature cool.   Exercise regularly as directed by your health care provider. Avoid exercising right before bedtime.  Use  relaxation techniques to manage stress. Ask your health care provider to suggest some techniques that may work well for you. These may include:  Breathing exercises.  Routines to release muscle tension.  Visualizing peaceful scenes.  Cut back on alcohol, caffeinated beverages, and cigarettes, especially close to bedtime. These can disrupt your sleep.  Do not overeat or eat spicy foods right before bedtime. This can lead to digestive discomfort that can make it hard for you to sleep.  Limit screen use before bedtime. This includes:  Watching TV.  Using your smartphone, tablet, and computer.  Stick to a routine. This can help you fall asleep faster. Try to do a quiet activity, brush your teeth, and go to bed at the same time each night.  Get out of bed if you are still awake after 15 minutes of trying to sleep. Keep the lights down, but try reading or doing a quiet activity. When you feel sleepy, go back to bed.  Make sure that you drive carefully. Avoid driving if you feel very sleepy.  Keep all follow-up appointments as directed by your health care provider. This is important. SEEK MEDICAL CARE IF:   You are tired throughout the day or have trouble in your daily routine due to sleepiness.  You continue to have sleep problems or your sleep problems get worse. SEEK IMMEDIATE MEDICAL CARE IF:   You have serious thoughts about hurting yourself or someone else.   This information is not intended to replace advice given to you by your health care provider. Make sure you discuss any questions you have with your health care provider.   Document Released: 07/11/2000 Document Revised: 04/04/2015 Document Reviewed: 04/14/2014 Elsevier Interactive Patient Education [employer].

## 2015-10-02 NOTE — Progress Notes (Signed)
Patient's here for HTN and Diabetes screening.   Patient states she having pain in her neck which she rates pain at 3/10. Described as painful to the touch. She states pain in neck has been going on x4-5 days.  Patient states she's having heart palpatation, rated at 3-4/10 and she thinks it's due to BP meds. Patient has monitored her blood pressure and has adjusted her meds on her own from 10mg  of Amlodinpine, to 5mg .  Patients agress to A1c screening and declines flu shot.  Per Dr. Janne Napoleon patient EKG was perform at hospital  with normal findings, no concerns there. Dr. Janne Napoleon decline EKG for patient.  Patient wants to have her eyes checked as part of her Physical on next OV. Patient been Dx with cataracts in both eyes.

## 2015-10-03 ENCOUNTER — Telehealth: Payer: Self-pay

## 2015-10-03 LAB — CBC WITH DIFFERENTIAL/PLATELET
BASOS ABS: 0 10*3/uL (ref 0.0–0.1)
BASOS PCT: 1 % (ref 0–1)
EOS ABS: 0 10*3/uL (ref 0.0–0.7)
EOS PCT: 1 % (ref 0–5)
HCT: 39.9 % (ref 36.0–46.0)
Hemoglobin: 13.9 g/dL (ref 12.0–15.0)
Lymphocytes Relative: 29 % (ref 12–46)
Lymphs Abs: 1.2 10*3/uL (ref 0.7–4.0)
MCH: 28.8 pg (ref 26.0–34.0)
MCHC: 34.8 g/dL (ref 30.0–36.0)
MCV: 82.6 fL (ref 78.0–100.0)
MONO ABS: 0.3 10*3/uL (ref 0.1–1.0)
Monocytes Relative: 6 % (ref 3–12)
Neutro Abs: 2.7 10*3/uL (ref 1.7–7.7)
Neutrophils Relative %: 63 % (ref 43–77)
PLATELETS: 129 10*3/uL — AB (ref 150–400)
RBC: 4.83 MIL/uL (ref 3.87–5.11)
RDW: 13.7 % (ref 11.5–15.5)
WBC: 4.3 10*3/uL (ref 4.0–10.5)

## 2015-10-03 NOTE — Telephone Encounter (Signed)
CMA called patient, patient verified name and DOB. Patient was given lab results and verbalized that she understood with no further questions. Patient did state that she would start the new cholesterol meds on her next visit.   Message will be sent to Provider for concerning patient agreeing to wait to start her new cholestrol med.

## 2015-10-03 NOTE — Telephone Encounter (Signed)
-----   Message from Maren Reamer, MD sent at 10/03/2015  9:05 AM EST ----- Please call patient and tell her that all her labs were good, except for her her cholesterol which was bad. Her Cholesterol is 279 (should be <200), and LDL (bad cholesterol) is 184 (should be <130). Would recommend starting a cholesterol lowering medicine, example simvastatin.  Can talk more at next appt or I can prescribe for her now if she wants. Thyroid normal.  Kidneys good. No anemia.

## 2015-10-04 ENCOUNTER — Other Ambulatory Visit: Payer: Self-pay

## 2015-10-08 MED ORDER — TRAZODONE HCL 50 MG PO TABS: 50.0000 mg | ORAL_TABLET | Freq: Every day | ORAL | Status: DC

## 2015-10-09 ENCOUNTER — Telehealth: Payer: Self-pay

## 2015-10-09 ENCOUNTER — Other Ambulatory Visit: Payer: Self-pay

## 2015-10-09 NOTE — Telephone Encounter (Signed)
CMA called patient, patient verified name and DOB. [facility] sent over a Prior authorization for patient Trazodone. Patient states she wanted her Rx filled at [facility]. Patient has an appt tomorrow, 03/15, so she will pick up her meds then. Patient verbalized she understood with no further questions.

## 2015-10-10 ENCOUNTER — Other Ambulatory Visit: Payer: Self-pay

## 2015-10-10 ENCOUNTER — Telehealth: Payer: Self-pay | Admitting: Internal Medicine

## 2015-10-10 ENCOUNTER — Ambulatory Visit: Payer: No Typology Code available for payment source | Attending: Internal Medicine | Admitting: Pharmacist

## 2015-10-10 VITALS — BP 166/96 | HR 88

## 2015-10-10 DIAGNOSIS — I10 Essential (primary) hypertension: Secondary | ICD-10-CM | POA: Insufficient documentation

## 2015-10-10 DIAGNOSIS — Z79899 Other long term (current) drug therapy: Secondary | ICD-10-CM | POA: Insufficient documentation

## 2015-10-10 MED ORDER — ENALAPRIL MALEATE 5 MG PO TABS: 5.0000 mg | ORAL_TABLET | Freq: Every day | ORAL | Status: DC

## 2015-10-10 MED ORDER — TRAZODONE HCL 50 MG PO TABS: 50.0000 mg | ORAL_TABLET | Freq: Every day | ORAL | Status: DC

## 2015-10-10 NOTE — Telephone Encounter (Signed)
error 

## 2015-10-10 NOTE — Patient Instructions (Addendum)
Thanks for coming to see [place]!  Start enalapril 5 mg daily.   Come back and see me in 1-2 weeks for a BP check

## 2015-10-10 NOTE — Progress Notes (Signed)
S:    Patient arrives in good spirits.    Presents to the clinic for hypertension evaluation. Patient was referred on 10/12/15 by Dr. Janne Napoleon.  Patient was last seen by Primary Care Provider on 10/02/15.   Patient reports adherence with medications.  Current BP Medications include:  Amlodipine 5 mg daily and metoprolol 25 mg BID. She was on amlodipine 10 mg daily and she self titrated down on the dose due to side effects.  Antihypertensives tried in the past include: enalapril (?flushing/?insomnia), HCTZ (?flushing/?insomnia), clonidine ("heart came out of chest"), labetalol (bradycardia).  Dietary habits include: has tried to limit salt intake. Brought a list of foods with her that she wanted to know if it was ok for her to eat.   Patient monitors blood pressure at home and her blood pressure runs from 150s-170s/80s-90s, pulse 55-70s.  She would like to slowly make any changes to her medications because she feels that abrupt changes are what causes the side effects for her.    O:   Last 3 Office BP readings: BP Readings from Last 3 Encounters:  10/02/15 182/91  10/01/15 170/86  09/24/15 187/82    BMET    Component Value Date/Time   NA 137 10/02/2015 1204   K 3.7 10/02/2015 1204   CL 100 10/02/2015 1204   CO2 23 10/02/2015 1204   GLUCOSE 92 10/02/2015 1204   BUN 12 10/02/2015 1204   CREATININE 0.90 10/02/2015 1204   CREATININE 1.00 10/01/2015 1405   CALCIUM 9.2 10/02/2015 1204    A/P: Hypertension longstanding currently uncontrolled on current medications. Blood pressure is better controlled at home so I think there is a component of white coat hypertension. Continued amlodipine 5 mg daily and metoprolol 25 mg BID. Initiated enalapril 5 mg daily. Will start at a low dose and titrate up. Patient verbalized understanding.   Results reviewed and written information provided.   Total time in face-to-face counseling 20 minutes.  F/U Clinic Visit with me in 1-2 weeks.  Patient seen  with Jamie Brookes, PharmD Candidate

## 2015-10-11 MED FILL — ENALAPRIL MALEATE 5 MG TAB: 5 | 30 days supply | Qty: 30 | Fill #0

## 2015-10-17 NOTE — Telephone Encounter (Signed)
CMA called patient, patient verified name and DOB. Patient reports having swelling and flush feeling with low pulse rate. Pulse rate ranges from 53-60. Patient concern about the variations in her pulse rate. Patient is currently on Amlodipine 5mg . I told patient i would send a message to Dr. Janne Napoleon and once I hear from her, I would call her back.  Patient states her BP range is within normal limits.

## 2015-10-17 NOTE — Telephone Encounter (Signed)
Theresa [name] from [facility] called stating that pt. Called them and requested her to call [facility] because she is having side effects on amLODipine (NORVASC) 5 MG tablet. Pt. Is having swelling, flushing, and her heart rate is low. Please f/u with pt.

## 2015-10-19 ENCOUNTER — Telehealth: Payer: Self-pay

## 2015-10-19 NOTE — Telephone Encounter (Signed)
-----   Message from Maren Reamer, MD sent at 10/18/2015  5:13 PM EDT ----- Regarding: RE: Patient's medication  .Marland KitchenMarland Kitchenplease call patient ASAP Please call patient and tell her to stop the norvasc. Thank you.d ----- Message -----    From: [employer], CMA    Sent: 10/17/2015   4:32 PM      To: Maren Reamer, MD Subject: Patient's medication                           Hey, This is the message that was left for me. I spoke with the patient and told her I would send you a message and once I heard back from you, I would call her back.  Thanks!  Patient message: Theresa [name] from [facility] called stating that pt. Called them and requested her to call [facility] because she is having side effects on amLODipine (NORVASC) 5 MG tablet. Pt. Is having swelling, flushing, and her heart rate is low.

## 2015-10-19 NOTE — Telephone Encounter (Signed)
CMA called patient, patient verified name and DOB. Patient was given the instruction to stop taking Norvasc, per Dr. Janne Napoleon. Patient verbalized she understood with no further questions.

## 2015-10-24 ENCOUNTER — Ambulatory Visit: Payer: No Typology Code available for payment source | Attending: Internal Medicine | Admitting: Pharmacist

## 2015-10-24 ENCOUNTER — Other Ambulatory Visit: Payer: Self-pay

## 2015-10-24 VITALS — BP 190/110

## 2015-10-24 DIAGNOSIS — T50995A Adverse effect of other drugs, medicaments and biological substances, initial encounter: Secondary | ICD-10-CM

## 2015-10-24 DIAGNOSIS — I10 Essential (primary) hypertension: Secondary | ICD-10-CM

## 2015-10-24 DIAGNOSIS — T7840XA Allergy, unspecified, initial encounter: Secondary | ICD-10-CM

## 2015-10-24 DIAGNOSIS — H5789 Other specified disorders of eye and adnexa: Secondary | ICD-10-CM

## 2015-10-24 DIAGNOSIS — H578 Other specified disorders of eye and adnexa: Secondary | ICD-10-CM

## 2015-10-24 MED ORDER — METOPROLOL TARTRATE 25 MG PO TABS: ORAL_TABLET | ORAL | Status: DC

## 2015-10-24 MED ORDER — POLYETHYL GLYCOL-PROPYL GLYCOL 0.4-0.3 % OP SOLN: 2.0000 [drp] | OPHTHALMIC | Status: DC | PRN

## 2015-10-24 MED FILL — ?METOPROLOL 25 MG TABLET: 25 | 28 days supply | Qty: 56 | Fill #1

## 2015-10-24 NOTE — Progress Notes (Signed)
Theresa [name], is a 70 y.o. female  [id number]  [id number]  DOB - 1945-09-26  No chief complaint on file.       Subjective:   Theresa [name] is a 70 y.o. female here today for a follow up visit for BP check w/ Erline Levine, Clinical pharmacist.  Pt noted to be tachycardic, hypertensive, and "doesn't feel well".  Recently stopped norvasc because of swelling concerns, she also noted burning eyes and some swelling around her eyes for the last 5 days as well.  She attributed this to possible reaction from trazodone, which she has since stopped.  She still has some c/o of burning eyes though, but denies discharge.  Denies Chest pain, but states feels palpitations.   Patient has No headache, No chest pain, No abdominal pain - No Nausea, No new weakness tingling or numbness, No Cough - SOB.  No problems updated.  ALLERGIES: Allergies  Allergen Reactions  . Shellfish Allergy Swelling  . Augmentin [Amoxicillin-Pot Clavulanate]   . Lisinopril-Hydrochlorothiazide     PAST MEDICAL HISTORY: Past Medical History  Diagnosis Date  . Hypertension   . River blindness   . Sinusitis   . Gastric ulcer     MEDICATIONS AT HOME: Prior to Admission medications   Medication Sig Start Date End Date Taking? Authorizing Provider  aspirin 81 MG tablet Take 81 mg by mouth daily. Reported on 10/02/2015   Yes Historical Provider, MD  enalapril (VASOTEC) 5 MG tablet Take 1 tablet (5 mg total) by mouth daily. 10/10/15  Yes Maren Reamer, MD  ivermectin (STROMECTOL) 3 MG TABS tablet Take 150 mcg/kg by mouth once. Reported on 10/10/2015   Yes Historical Provider, MD  metoprolol tartrate (LOPRESSOR) 25 MG tablet Take 1 tablet (25 mg total) by mouth 2 (two) times daily. 10/02/15  Yes Maren Reamer, MD  pantoprazole (PROTONIX) 40 MG tablet Take 1 tablet (40 mg total) by mouth daily. 10/02/15  Yes Maren Reamer, MD  traZODone (DESYREL) 50 MG tablet Take 1 tablet (50 mg total) by mouth at bedtime. 10/10/15   Yes Maren Reamer, MD  amLODipine (NORVASC) 5 MG tablet Take 1 tablet (5 mg total) by mouth daily. Patient not taking: Reported on 10/24/2015 10/02/15   Maren Reamer, MD     Objective:  190/110  Exam General appearance : Awake, alert, not in any distress. Speech Clear. Not toxic looking, pleasant.  Not diaphortic. HEENT: Atraumatic and Normocephalic, pupils equally reactive to light.  No conjuctival erythema/periorbital edema.   No oral angioedema noted. Neck: supple, no JVD. Chest:Good air entry bilaterally, no added sounds. CVS: S1 S2 tachycardic, no murmurs/gallups or rubs. Abdomen: Bowel sounds active, soft Extremities: B/L Lower Ext shows no edema, both legs are warm to touch Neurology: Awake alert, and oriented X 3, CN II-XII grossly intact, Non focal Skin:No Rash  Data Review Lab Results  Component Value Date   HGBA1C 5.4 10/02/2015    Ekg/ 2/29 - nsr 77 bpm, qtc nml, mod criteria for lvh, no stemi.  nad.  Assessment & Plan   1. HTN (hypertension), accelerated/urgency - lots of mediations allergies. Suspect not allergic to acei (combo lisinopril/hctz, we think she had reaction to HCTZ  At the time), now w/ allergic reaction to possibly norvasc and trazodone as well. 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 - currently on metoprolol tartrate 25bid, and enalapril 5 qday. - lengthy discussion w/ patient and pharmacist, will increase am dose of metoprolol to 50  mg  And keep pm dose of 25mg  for now and follow, keep enalapril 5 - come back next w/ week Stacey for bp chk.  2. Irritating/burining eyes, - no signs of conjuctivitis. - trial saline eye lubricant, if related to medication adverse events, than hopefully will resolve within week.  3. Medication sensitivities/ -caution when adding meds.   Patient have been counseled extensively about nutrition and exercise  Fu next wk w/ Stacey/pharm clinic - follow up w/ me in 1-2 months.  The patient was given clear  instructions to go to ER or return to medical center if symptoms don't improve, worsen or new problems develop. The patient verbalized understanding. The patient was told to call to get lab results if they haven't heard anything in the next week.    Maren Reamer, MD, [place] [facility] [place], [place]   10/24/2015, 10:59 AM

## 2015-10-24 NOTE — Addendum Note (Signed)
Addended by: Nicoletta Ba A on: 10/24/2015 12:20 PM   Modules accepted: Orders

## 2015-10-24 NOTE — Progress Notes (Addendum)
S:    Patient arrives in good spirits. Presents to the clinic for hypertension evaluation. Patient was referred on 10/12/15 by Dr. Janne Napoleon.  Patient was last seen by Primary Care Provider on 10/02/15.   Patient reports adherence with medications. However, she stopped the amlodipine due to swelling, palpitations, and decreased heart rate.  Current BP Medications include:  Enalapril 5 mg daily and metoprolol 25 mg BID.   Antihypertensives tried in the past include: enalapril (?flushing/?insomnia), HCTZ (?flushing/?insomnia), clonidine ("heart came out of chest"), labetalol (bradycardia), amlodipine (swelling, decreased heart rate, palpitations).  Dietary habits include: continues to limit salt intake  Patient monitors blood pressure at home and her blood pressure runs from 120s-170s/80s-90s, pulse 50s-70s.  She would like to slowly make any changes to her medications because she feels that abrupt changes are what causes the side effects for her.   Patient reports chest pain, eye pain/blurriness, and palpitations.   O:   Last 3 Office BP readings: BP Readings from Last 3 Encounters:  10/10/15 166/96  10/02/15 182/91  10/01/15 170/86    BMET    Component Value Date/Time   NA 137 10/02/2015 1204   K 3.7 10/02/2015 1204   CL 100 10/02/2015 1204   CO2 23 10/02/2015 1204   GLUCOSE 92 10/02/2015 1204   BUN 12 10/02/2015 1204   CREATININE 0.90 10/02/2015 1204   CREATININE 1.00 10/01/2015 1405   CALCIUM 9.2 10/02/2015 1204    A/P: Hypertension longstanding currently uncontrolled on current medications. Blood pressure is better controlled at home so I think there is a component of white coat hypertension. However, she still needs additional therapy. Due to chest pain and vision issues, consulted Dr. Janne Napoleon. See her note for changes.  Updated medication list and her allergy list.  Results reviewed and written information provided.   Total time in face-to-face counseling 20 minutes.   F/U Clinic Visit with me in 1 week,

## 2015-10-24 NOTE — Patient Instructions (Addendum)
Thank you for coming to see me  Increase your metoprolol to 2 tablets in the morning and continue taking 1 tablet at night  Come back next week for a blood pressure check  All of the foods look good - you can eat those  GET HELP RIGHT AWAY (CALL 911) IF:   You get a very bad headache and are confused.  You feel weak, numb, or faint.  You get chest or belly (abdominal) pain.  You throw up (vomit).  You cannot breathe very well.  Hypertension Hypertension is another name for high blood pressure. High blood pressure forces your heart to work harder to pump blood. A blood pressure reading has two numbers, which includes a higher number over a lower number (example: 110/72). HOME CARE   Have your blood pressure rechecked by your doctor.  Only take medicine as told by your doctor. Follow the directions carefully. The medicine does not work as well if you skip doses. Skipping doses also puts you at risk for problems.  Do not smoke.  Monitor your blood pressure at home as told by your doctor. GET HELP IF:  You think you are having a reaction to the medicine you are taking.  You have repeat headaches or feel dizzy.  You have puffiness (swelling) in your ankles.  You have trouble with your vision. MAKE SURE YOU:   Understand these instructions.  Will watch your condition.  Will get help right away if you are not doing well or get worse.   This information is not intended to replace advice given to you by your health care provider. Make sure you discuss any questions you have with your health care provider.   Document Released: 12/31/2007 Document Revised: 07/19/2013 Document Reviewed: 05/06/2013 Elsevier Interactive Patient Education [employer].

## 2015-10-29 ENCOUNTER — Telehealth: Payer: Self-pay

## 2015-10-29 NOTE — Telephone Encounter (Signed)
Opened in error

## 2015-10-31 ENCOUNTER — Encounter: Payer: Self-pay | Admitting: Internal Medicine

## 2015-10-31 ENCOUNTER — Ambulatory Visit: Payer: No Typology Code available for payment source | Attending: Internal Medicine | Admitting: Internal Medicine

## 2015-10-31 VITALS — BP 221/103 | HR 82 | Temp 98.0°F | Resp 16 | Ht 64.0 in | Wt 169.0 lb

## 2015-10-31 DIAGNOSIS — I10 Essential (primary) hypertension: Secondary | ICD-10-CM

## 2015-10-31 MED ORDER — METOPROLOL TARTRATE 25 MG PO TABS: ORAL_TABLET | ORAL | Status: DC

## 2015-10-31 MED ORDER — ENALAPRIL MALEATE 5 MG PO TABS: 5.0000 mg | ORAL_TABLET | Freq: Two times a day (BID) | ORAL | Status: DC

## 2015-10-31 NOTE — Progress Notes (Signed)
Theresa [name], is a 70 y.o. female  [id number]  [id number]  DOB - Jan 15, 1946  Chief Complaint  Patient presents with  . Blood Pressure Check        Subjective:   Theresa [name] is a 70 y.o. female here today for a follow up visit.  Patient is known to my service for hypertension with difficulty controlling her blood pressure secondary to a lot of sensitivities and hypersensitivities to medications.  She showed me her blood pressure monitoring at home and her blood pressures are actually ranging in the 150/78 to the 180/90 at home. Denies she denies any complaints today. Denies any chest pain, palpitations or shortness of breath. He has difficulty coming here due to transportation issues. She also mentions that she received a bill for prescription medications, but she thought she had a pharmacy prescription plan:   Of note, she also has a [name] here today who is in her car seat. She denies having any problems since we increased her metoprolol in the morning to 50 mg daily and kept metoprolol 25 qpm.   ALLERGIES: Allergies  Allergen Reactions  . Amlodipine Swelling  . Shellfish Allergy Swelling  . Trazodone And Nefazodone Swelling    Possible eye pain and swelling  . Augmentin [Amoxicillin-Pot Clavulanate]   . Clonidine Derivatives Other (See Comments)    "heart came out of chest"  . Lisinopril-Hydrochlorothiazide Other (See Comments)    Possible flushing, insomnia    PAST MEDICAL HISTORY: Past Medical History  Diagnosis Date  . Hypertension   . River blindness   . Sinusitis   . Gastric ulcer     MEDICATIONS AT HOME: Prior to Admission medications   Medication Sig Start Date End Date Taking? Authorizing Provider  acetaminophen (TYLENOL) 500 MG tablet Take 500 mg by mouth every 6 (six) hours as needed for mild pain or moderate pain.    Historical Provider, MD  aspirin 81 MG tablet Take 81 mg by mouth daily. Reported on 10/02/2015    Historical Provider, MD    enalapril (VASOTEC) 5 MG tablet Take 1 tablet (5 mg total) by mouth 2 (two) times daily. 10/31/15   Theresa Reamer, MD  folic acid (FOLVITE) A999333 MCG tablet Take 400 mcg by mouth daily.    Historical Provider, MD  ivermectin (STROMECTOL) 3 MG TABS tablet Take 150 mcg/kg by mouth once. Reported on 10/10/2015    Historical Provider, MD  metoprolol tartrate (LOPRESSOR) 25 MG tablet Take 2 tablets in the morning and 1 tablet in the evening 10/31/15   Theresa Reamer, MD  Multiple Vitamin (MULTIVITAMIN) tablet Take 1 tablet by mouth daily.    Historical Provider, MD  pantoprazole (PROTONIX) 40 MG tablet Take 1 tablet (40 mg total) by mouth daily. 10/02/15   Theresa Reamer, MD  Polyethyl Glycol-Propyl Glycol (EQ LUBRICANT EYE DROPS) 0.4-0.3 % SOLN Apply 2 drops to eye every 2 (two) hours as needed (for burning eyes). 10/24/15   Theresa Reamer, MD     Objective:   Filed Vitals:   10/31/15 1050 10/31/15 1052  BP: 226/122 221/103  Pulse: 90 82  Temp: 98 F (36.7 C)   Resp: 16   Height: 5\' 4"  (1.626 m)   Weight: 169 lb (76.658 kg)   SpO2: 100%     Manual bp by me:  Right arm 182/100  Left arm 180/100  Exam General appearance : Awake, alert, not in any distress. Speech Clear. Not toxic looking. Pleasant. HEENT: Atraumatic and  Normocephalic, pupils equally reactive to light. Neck: supple, no JVD.  Chest:Good air entry bilaterally, no added sounds. CVS: S1 S2 regular, no murmurs/gallups or rubs. Extremities: B/L Lower Ext shows no edema, both legs are warm to touch Neurology: Awake alert, and oriented X 3, CN II-XII grossly intact, Non focal Skin:No Rash  Data Review Lab Results  Component Value Date   HGBA1C 5.4 10/02/2015     Assessment & Plan   1. HTN (hypertension), malignant Given her multiple hypersensitivity issues, we have been taking very small baby steps to adjust her blood pressure meds. - Will keep metoprolol 50 in the morning and 25mg  at night. I want to increase her  enalapril to 5 mg by mouth twice a day.  - - Given her significant malignant hypertension and difficulty with blood pressures. We will check renal ultrasound to rule out renal artery stenosis: - VAS US RENAL ARTERY DUPLEX; Future - f/u Theresa [name] clinic next week for bp check  3. hypersensitivity to medication , numerous side effects  2. Financial services/transportation assistance - to help patient, dw rn/and financial services..  Although Pt has prescription card, it does not mean the meds are free, she will need to discuss w/ pharmacy to wk out a payment schedule.    Patient have been counseled extensively about nutrition and exercise  Return in about 4 weeks (around 11/28/2015).  The patient was given clear instructions to go to ER or return to medical center if symptoms don't improve, worsen or new problems develop. The patient verbalized understanding. The patient was told to call to get lab results if they haven't heard anything in the next week.    Theresa Reamer, MD, [place] [facility] [place], [place]   10/31/2015, 1:49 PM

## 2015-10-31 NOTE — Patient Instructions (Signed)
-   fiancial aid - does she have the prescription discount? She said they charged her for meds last time, please chk - fu Theresa [name] pharm next wk - bp chk  - please make appt for pt for Renal US (ro Renal artery stenosis) -  Pt needs transportation assistance for appts here and [place].

## 2015-10-31 NOTE — Progress Notes (Signed)
Patient here for blood pressure check Patient presents in office with elevated blood pressure Unable to give clonidine due to her allergy

## 2015-11-01 ENCOUNTER — Ambulatory Visit (HOSPITAL_COMMUNITY)
Admission: RE | Admit: 2015-11-01 | Discharge: 2015-11-01 | Disposition: A | Discharge status: Home / Self Care | Payer: No Typology Code available for payment source | Source: Ambulatory Visit | Attending: Internal Medicine | Admitting: Internal Medicine

## 2015-11-01 DIAGNOSIS — I701 Atherosclerosis of renal artery: Secondary | ICD-10-CM | POA: Insufficient documentation

## 2015-11-01 DIAGNOSIS — N281 Cyst of kidney, acquired: Secondary | ICD-10-CM | POA: Insufficient documentation

## 2015-11-01 DIAGNOSIS — I10 Essential (primary) hypertension: Secondary | ICD-10-CM

## 2015-11-01 NOTE — Progress Notes (Signed)
*  PRELIMINARY RESULTS* Vascular Ultrasound Renal Artery Duplex has been completed.   Findings suggest 1-59% renal artery stenosis. There is evidence of elevated resistive indices in bilateral intrarenal arteries. Incidental finding: there is evidence of multiple simple cysts in the left kidney, largest measuring 1.14cm.  11/01/2015 9:59 AM Maudry Mayhew, RVT, RDCS, RDMS

## 2015-11-07 ENCOUNTER — Ambulatory Visit: Payer: No Typology Code available for payment source | Admitting: Internal Medicine

## 2015-11-07 ENCOUNTER — Telehealth: Payer: Self-pay | Admitting: Internal Medicine

## 2015-11-07 MED FILL — ?METOPROLOL 25 MG TABLET: 25 | 30 days supply | Qty: 90 | Fill #0

## 2015-11-07 MED FILL — ENALAPRIL MALEATE 5 MG TAB: 5 | 25 days supply | Qty: 50 | Fill #0

## 2015-11-07 MED FILL — ENALAPRIL MALEATE 5 MG TAB: 5 | 30 days supply | Qty: 30 | Fill #1

## 2015-11-07 NOTE — Telephone Encounter (Signed)
Pt. Came into facility requesting her ultrasound results. Pt. Also stated that her pulse has been dropping and she would like to speak to the nurse. Please f/u with pt.

## 2015-11-14 ENCOUNTER — Ambulatory Visit: Payer: No Typology Code available for payment source | Admitting: Pharmacist

## 2015-11-20 ENCOUNTER — Other Ambulatory Visit: Payer: Self-pay | Admitting: Internal Medicine

## 2015-11-20 DIAGNOSIS — I150 Renovascular hypertension: Secondary | ICD-10-CM

## 2015-11-22 ENCOUNTER — Telehealth: Payer: Self-pay

## 2015-11-22 NOTE — Telephone Encounter (Signed)
Theresa Reamer, MD  [employer], CMA         Please call pt w/ results. I actually reviewed this on 4/10 and ask rn to call then w/ results...but didn't happen due to our RN issues?    Please call patient with results of her [place]; borderline renal artery stenosis noted. Her Kidney function is currently good, we will continue to work on titrating her bp meds. When she has time, to come to our lab early in am (9am) for further labs (already ordered for furture) on her kidneys to determine if need further evaluation on kidneys w/ other studies (ie Mri). Thanks.      Patient was contacted, verified name and DOB. Patient stated that she received a bill from [place]. Patient is concern about being billed again if she comes for lab visit. Patient has an appt set for 05/10 and she will pick up the form then for financial assistance through [facility].

## 2015-11-26 ENCOUNTER — Ambulatory Visit: Payer: No Typology Code available for payment source | Attending: Internal Medicine

## 2015-11-26 DIAGNOSIS — I10 Essential (primary) hypertension: Secondary | ICD-10-CM

## 2015-11-26 DIAGNOSIS — I150 Renovascular hypertension: Secondary | ICD-10-CM | POA: Insufficient documentation

## 2015-11-26 NOTE — Progress Notes (Signed)
Patient visit for lab only.

## 2015-11-26 NOTE — Patient Instructions (Signed)
Patient will receive a call with her lab results once there in.

## 2015-11-30 LAB — ALDOSTERONE + RENIN ACTIVITY W/ RATIO
ALDO / PRA Ratio: 20 Ratio (ref 0.9–28.9)
ALDOSTERONE: 3 ng/dL
PRA LC/MS/MS: 0.15 ng/mL/h — AB (ref 0.25–5.82)

## 2015-12-03 ENCOUNTER — Telehealth: Payer: Self-pay | Admitting: Internal Medicine

## 2015-12-03 NOTE — Telephone Encounter (Signed)
Patient would like to speak to nurse regarding lab results...please follow up with patient

## 2015-12-05 ENCOUNTER — Ambulatory Visit: Payer: No Typology Code available for payment source | Attending: Internal Medicine | Admitting: Internal Medicine

## 2015-12-05 ENCOUNTER — Encounter: Payer: Self-pay | Admitting: Internal Medicine

## 2015-12-05 VITALS — BP 217/124 | HR 80 | Temp 97.4°F | Wt 169.8 lb

## 2015-12-05 DIAGNOSIS — Z13820 Encounter for screening for osteoporosis: Secondary | ICD-10-CM

## 2015-12-05 DIAGNOSIS — I10 Essential (primary) hypertension: Secondary | ICD-10-CM

## 2015-12-05 DIAGNOSIS — Z1239 Encounter for other screening for malignant neoplasm of breast: Secondary | ICD-10-CM

## 2015-12-05 DIAGNOSIS — K029 Dental caries, unspecified: Secondary | ICD-10-CM

## 2015-12-05 DIAGNOSIS — E785 Hyperlipidemia, unspecified: Secondary | ICD-10-CM

## 2015-12-05 DIAGNOSIS — F411 Generalized anxiety disorder: Secondary | ICD-10-CM

## 2015-12-05 DIAGNOSIS — Z1211 Encounter for screening for malignant neoplasm of colon: Secondary | ICD-10-CM

## 2015-12-05 MED FILL — ENALAPRIL MALEATE 5 MG TAB: 5 | 25 days supply | Qty: 50 | Fill #1

## 2015-12-05 MED FILL — METOPROLOL TARTRATE 25 MG T: 25 | 30 days supply | Qty: 90 | Fill #1

## 2015-12-05 NOTE — Telephone Encounter (Signed)
Patient verified DOB Patient is aware of US showing no significant evidence of renal artery stenosis. Patient is also aware of kidney function being good. Patient advised of BP medication being monitored. Patient expressed her understanding and had no further questions at this time.

## 2015-12-05 NOTE — Progress Notes (Signed)
Theresa [name], is a 70 y.o. female  [id number]  [id number]  DOB - 1945/12/20  Chief Complaint  Patient presents with  . Follow-up    4 wk        Subjective:   Theresa [name] is a 70 y.o. female here today for a follow up visit for HTN.  She is very anxious and distressed today about her medical bills, which has caused her to be very anxious and tearful during exam. She is very frustrated about her financial problems and bills.  She showed me her bp readings at home, typically ranging sbp 140-170s , "depending on stress". She denies CP/sob, just anxiety.  D/w at length Re: my medical concerns including her malignant htn and hyplipidemia. She is not interested in starting any new antihypertensives (ie offered Hydralazine) or stating for lipid lowering (ie simvastatin ).  She is going to try relaxation exercises and dietary modifications for her cholesterol. She said someone is going to send her some medications from her country to help her cholesterol as well, she could not tell me what.  She also declined mammogram and colonoscopy at this time. She feels she is otherwise healthy, except for the stress, and "GOD" will take care of her.  Pt c/o of dental carries/tooth pains, asked referral for dentist.  Patient has No headache, No chest pain, No abdominal pain - No Nausea, No new weakness tingling or numbness, No Cough - SOB.  No problems updated.  ALLERGIES: Allergies  Allergen Reactions  . Amlodipine Swelling  . Shellfish Allergy Swelling  . Trazodone And Nefazodone Swelling    Possible eye pain and swelling  . Augmentin [Amoxicillin-Pot Clavulanate]   . Clonidine Derivatives Other (See Comments)    "heart came out of chest"  . Lisinopril-Hydrochlorothiazide Other (See Comments)    Possible flushing, insomnia    PAST MEDICAL HISTORY: Past Medical History  Diagnosis Date  . Hypertension   . River blindness   . Sinusitis   . Gastric ulcer     MEDICATIONS AT  HOME: Prior to Admission medications   Medication Sig Start Date End Date Taking? Authorizing Provider  acetaminophen (TYLENOL) 500 MG tablet Take 500 mg by mouth every 6 (six) hours as needed for mild pain or moderate pain.    Historical Provider, MD  aspirin 81 MG tablet Take 81 mg by mouth daily. Reported on 10/02/2015    Historical Provider, MD  enalapril (VASOTEC) 5 MG tablet Take 1 tablet (5 mg total) by mouth 2 (two) times daily. 10/31/15   Maren Reamer, MD  folic acid (FOLVITE) A999333 MCG tablet Take 400 mcg by mouth daily.    Historical Provider, MD  ivermectin (STROMECTOL) 3 MG TABS tablet Take 150 mcg/kg by mouth once. Reported on 10/10/2015    Historical Provider, MD  metoprolol tartrate (LOPRESSOR) 25 MG tablet Take 2 tablets in the morning and 1 tablet in the evening 10/31/15   Maren Reamer, MD  Multiple Vitamin (MULTIVITAMIN) tablet Take 1 tablet by mouth daily.    Historical Provider, MD  pantoprazole (PROTONIX) 40 MG tablet Take 1 tablet (40 mg total) by mouth daily. 10/02/15   Maren Reamer, MD  Polyethyl Glycol-Propyl Glycol (EQ LUBRICANT EYE DROPS) 0.4-0.3 % SOLN Apply 2 drops to eye every 2 (two) hours as needed (for burning eyes). 10/24/15   Maren Reamer, MD     Objective:   Filed Vitals:   12/05/15 0913  BP: 217/124  Pulse: 80  Temp: 97.4  F (36.3 C)  TempSrc: Oral  Weight: 169 lb 12.8 oz (77.021 kg)   Repeat bp after deep breathing exercises w/ me: 170/90 manual  Exam General appearance : Awake, alert, not in any distress. Speech Clear. Not toxic looking, very anxious and tearful about her bills HEENT: Atraumatic and Normocephalic, pupils equally reactive to light. Neck: supple, no JVD. No cervical lymphadenopathy.  No carotid bruits. Chest:Good air entry bilaterally, no added sounds. CVS: S1 S2 regular, no murmurs/gallups or rubs. Abdomen: Bowel sounds active, Non tender and not distended with no gaurding, rigidity or rebound. Extremities: B/L Lower Ext  shows no edema, both legs are warm to touch Neurology: Awake alert, and oriented X 3, CN II-XII grossly intact, Non focal, very anxious Skin:No Rash  Data Review Lab Results  Component Value Date   HGBA1C 5.4 10/02/2015    Depression screen [facility] 2/9 12/05/2015 10/02/2015  Decreased Interest 0 0  Down, Depressed, Hopeless 1 0  PHQ - 2 Score 1 0  Altered sleeping 0 -  Tired, decreased energy 0 -  Change in appetite 0 -  Feeling bad or failure about yourself  0 -  Trouble concentrating 0 -  Moving slowly or fidgety/restless 0 -  Suicidal thoughts 0 -  PHQ-9 Score 1 -  Difficult doing work/chores Somewhat difficult -      Assessment & Plan   1. HTN (hypertension), malignant, likely worsened w/ stress -  typically decently controlled per review of her bp readings, sbp 150s for her is "normal" per pt. Gets sx when normalizing, - after talking w/ pt and doing some breathing exercises for anxiety, bp when down to 170/90 - recd hydralazine 25tid, but she wants to hold off on starting new medications currently b/c she knows it's related to her stress.  2. Hyperlipidemia - recd simvastatin 40, but pt wants to do lifestyle/diet modifications, and try this medication she is getting form her country first.  I cautioned her on using foreign medications.   3. Colon cancer screening Recd cscope, but patient wants to hold off due to concerns of cost  4. Breast cancer screening Recd MM but patient wants to hold off due to concerns of cost  5. Osteoporosis screening Recd Dexa patient wants to hold off due to concerns of cost  6. Anxiety state Recd breathing techniques, she does  Not want to take anything for it. She is planning to visit family around the area to relieve some stress. I asked her to f/u w/ me prior to travels so we can chk her bp.  7. Financial services - today if able  8. Dental carries -  Dental referral placed   Patient have been counseled extensively about  nutrition and exercise  Return in about 2 weeks (around 12/19/2015) for htn malignant. w/ me or STacy.  The patient was given clear instructions to go to ER or return to medical center if symptoms don't improve, worsen or new problems develop. The patient verbalized understanding. The patient was told to call to get lab results if they haven't heard anything in the next week.    Maren Reamer, MD, [place] and [facility] [place], [place]   12/05/2015, 10:10 AM

## 2015-12-05 NOTE — Telephone Encounter (Signed)
-----   Message from Maren Reamer, MD sent at 11/05/2015  7:10 PM EDT ----- Please call patient with results of her [place]; no significant renal artery stenosis noted.  Her  Kidney function is currently good, we will continue to work on titrating her bp meds. thanks

## 2015-12-12 ENCOUNTER — Ambulatory Visit: Payer: No Typology Code available for payment source | Admitting: Pharmacist

## 2015-12-12 ENCOUNTER — Ambulatory Visit: Payer: No Typology Code available for payment source

## 2015-12-19 ENCOUNTER — Ambulatory Visit: Payer: No Typology Code available for payment source | Admitting: Pharmacist

## 2016-01-04 MED FILL — ?METOPROLOL 25 MG TABLET: 25 | 30 days supply | Qty: 90 | Fill #2

## 2016-01-04 MED FILL — ENALAPRIL MALEATE 5 MG TAB: 5 | 25 days supply | Qty: 50 | Fill #2

## 2016-01-09 ENCOUNTER — Ambulatory Visit: Payer: No Typology Code available for payment source | Attending: Internal Medicine

## 2016-01-09 ENCOUNTER — Telehealth: Payer: Self-pay | Admitting: Internal Medicine

## 2016-01-09 NOTE — Telephone Encounter (Signed)
Pt. Came into facility stating that she is having tingling in her hands and feet.  She would like to speak with her PCP for advise. Please f/u

## 2016-02-01 MED FILL — METOPROLOL TARTRATE 25 MG T: 25 | 30 days supply | Qty: 90 | Fill #0

## 2016-02-01 MED FILL — ENALAPRIL MALEATE 5 MG TAB: 5 | 15 days supply | Qty: 30 | Fill #3

## 2016-02-04 ENCOUNTER — Other Ambulatory Visit: Payer: Self-pay | Admitting: Internal Medicine

## 2016-02-11 MED FILL — METOPROLOL TARTRATE 25 MG T: 25 | 90 days supply | Qty: 180 | Fill #2

## 2016-02-11 MED FILL — ENALAPRIL MALEATE 5 MG TAB: 5 | 90 days supply | Qty: 180 | Fill #0

## 2016-02-11 MED FILL — ACETAMINOPHEN/COD #3 TABLET: 300-30 | 8 days supply | Qty: 20 | Fill #0

## 2016-02-12 ENCOUNTER — Telehealth: Payer: Self-pay | Admitting: Internal Medicine

## 2016-02-12 MED FILL — METOPROLOL TARTRATE 25 MG T: 25 | 30 days supply | Qty: 90 | Fill #1

## 2016-02-12 NOTE — Telephone Encounter (Signed)
Pt. Came into facility to let her PCP know that she will be leaving out of state for a convention. Pt. Also stated that she has tingling in her hand. Please f/u with pt.

## 2016-03-31 ENCOUNTER — Encounter (HOSPITAL_COMMUNITY): Payer: Self-pay | Admitting: Student-PharmD

## 2016-05-09 ENCOUNTER — Other Ambulatory Visit: Payer: Self-pay | Admitting: Internal Medicine

## 2016-05-09 MED FILL — ENALAPRIL MALEATE 5 MG TAB: 5 | 30 days supply | Qty: 60 | Fill #0

## 2016-05-09 MED FILL — METOPROLOL TARTRATE 25 MG T: 25 | 90 days supply | Qty: 270 | Fill #2

## 2016-06-09 ENCOUNTER — Ambulatory Visit: Payer: Self-pay | Attending: Internal Medicine | Admitting: Internal Medicine

## 2016-06-09 ENCOUNTER — Encounter: Payer: Self-pay | Admitting: Internal Medicine

## 2016-06-09 VITALS — BP 180/98 | HR 64 | Temp 99.0°F | Resp 16 | Wt 175.6 lb

## 2016-06-09 DIAGNOSIS — I10 Essential (primary) hypertension: Secondary | ICD-10-CM | POA: Insufficient documentation

## 2016-06-09 DIAGNOSIS — F411 Generalized anxiety disorder: Secondary | ICD-10-CM | POA: Insufficient documentation

## 2016-06-09 DIAGNOSIS — H269 Unspecified cataract: Secondary | ICD-10-CM | POA: Insufficient documentation

## 2016-06-09 DIAGNOSIS — E785 Hyperlipidemia, unspecified: Secondary | ICD-10-CM | POA: Insufficient documentation

## 2016-06-09 DIAGNOSIS — Z7982 Long term (current) use of aspirin: Secondary | ICD-10-CM | POA: Insufficient documentation

## 2016-06-09 DIAGNOSIS — Z79899 Other long term (current) drug therapy: Secondary | ICD-10-CM | POA: Insufficient documentation

## 2016-06-09 MED ORDER — ASPIRIN 81 MG PO TABS: 81.0000 mg | ORAL_TABLET | Freq: Every day | ORAL | 3 refills | Status: DC

## 2016-06-09 MED ORDER — PRAVASTATIN SODIUM 20 MG PO TABS: 20.0000 mg | ORAL_TABLET | Freq: Every day | ORAL | 3 refills | Status: DC

## 2016-06-09 MED ORDER — ENALAPRIL MALEATE 10 MG PO TABS: ORAL_TABLET | ORAL | 3 refills | Status: DC

## 2016-06-09 MED ORDER — METOPROLOL TARTRATE 25 MG PO TABS: ORAL_TABLET | ORAL | 3 refills | Status: DC

## 2016-06-09 MED FILL — ENALAPRIL MALEATE 10 MG TAB: 10 | 90 days supply | Qty: 180 | Fill #0

## 2016-06-09 MED FILL — METOPROLOL TARTRATE 25 MG T: 25 | 30 days supply | Qty: 90 | Fill #0

## 2016-06-09 NOTE — Patient Instructions (Addendum)
Transportation assistance - forms? Scat? Please assist.   High Cholesterol High cholesterol refers to having a high level of cholesterol in your blood. Cholesterol is a white, waxy, fat-like protein that your body needs in small amounts. Your liver makes all the cholesterol you need. Excess cholesterol comes from the food you eat. Cholesterol travels in your bloodstream through your blood vessels. If you have high cholesterol, deposits (plaque) may build up on the walls of your blood vessels. This makes the arteries narrower and stiffer. Plaque increases your risk of heart attack and stroke. Work with your health care provider to keep your cholesterol levels in a healthy range. RISK FACTORS Several things can make you more likely to have high cholesterol. These include:   Eating foods high in animal fat (saturated fat) or cholesterol.  Being overweight.  Not getting enough exercise.  Having a family history of high cholesterol. SIGNS AND SYMPTOMS High cholesterol does not cause symptoms. DIAGNOSIS  Your health care provider can do a blood test to check whether you have high cholesterol. If you are older than 20, your health care provider may check your cholesterol every 4-6 years. You may be checked more often if you already have high cholesterol or other risk factors for heart disease. The blood test for cholesterol measures the following:  Bad cholesterol (LDL cholesterol). This is the type of cholesterol that causes heart disease. This number should be less than 100.  Good cholesterol (HDL cholesterol). This type helps protect against heart disease. A healthy level of HDL cholesterol is 60 or higher.  Total cholesterol. This is the combined number of LDL cholesterol and HDL cholesterol. A healthy number is less than 200. TREATMENT  High cholesterol can be treated with diet changes, lifestyle changes, and medicine.   Diet changes may include eating more whole grains, fruits,  vegetables, nuts, and fish. You may also have to cut back on red meat and foods with a lot of added sugar.  Lifestyle changes may include getting at least 40 minutes of aerobic exercise three times a week. Aerobic exercises include walking, biking, and swimming. Aerobic exercise along with a healthy diet can help you maintain a healthy weight. Lifestyle changes may also include quitting smoking.  If diet and lifestyle changes are not enough to lower your cholesterol, your health care provider may prescribe a statin medicine. This medicine has been shown to lower cholesterol and also lower the risk of heart disease. HOME CARE INSTRUCTIONS  Only take over-the-counter or prescription medicines as directed by your health care provider.   Follow a healthy diet as directed by your health care provider. For instance:   Eat chicken (without skin), fish, veal, shellfish, ground [place] breast, and round or loin cuts of red meat.  Do not eat fried foods and fatty meats, such as hot dogs and salami.   Eat plenty of fruits, such as apples.   Eat plenty of vegetables, such as broccoli, potatoes, and carrots.   Eat beans, peas, and lentils.   Eat grains, such as barley, rice, couscous, and bulgur wheat.   Eat pasta without cream sauces.   Use skim or nonfat milk and low-fat or nonfat yogurt and cheeses. Do not eat or drink whole milk, cream, ice cream, egg yolks, and hard cheeses.   Do not eat stick margarine or tub margarines that contain trans fats (also called partially hydrogenated oils).   Do not eat cakes, cookies, crackers, or other baked goods that contain trans fats.  Do not eat saturated tropical oils, such as coconut and palm oil.   Exercise as directed by your health care provider. Increase your activity level with activities such as gardening or walking.   Keep all follow-up appointments.  SEEK MEDICAL CARE IF:  You are struggling to maintain a healthy diet or  weight.  You need help starting an exercise program.  You need help to stop smoking. SEEK IMMEDIATE MEDICAL CARE IF:  You have chest pain.  You have trouble breathing.   This information is not intended to replace advice given to you by your health care provider. Make sure you discuss any questions you have with your health care provider.   Document Released: 07/14/2005 Document Revised: 08/04/2014 Document Reviewed: 05/06/2013 Elsevier Interactive Patient Education 2016 Elsevier Inc.   -  Low-Sodium Eating Plan Sodium raises blood pressure and causes water to be held in the body. Getting less sodium from food will help lower your blood pressure, reduce any swelling, and protect your heart, liver, and kidneys. We get sodium by adding salt (sodium chloride) to food. Most of our sodium comes from canned, boxed, and frozen foods. Restaurant foods, fast foods, and pizza are also very high in sodium. Even if you take medicine to lower your blood pressure or to reduce fluid in your body, getting less sodium from your food is important. WHAT IS MY PLAN? Most people should limit their sodium intake to 2,300 mg a day. Your health care provider recommends that you limit your sodium intake to 2,000 MG a day.  WHAT DO I NEED TO KNOW ABOUT THIS EATING PLAN? For the low-sodium eating plan, you will follow these general guidelines:  Choose foods with a % Daily Value for sodium of less than 5% (as listed on the food label).   Use salt-free seasonings or herbs instead of table salt or sea salt.   Check with your health care provider or pharmacist before using salt substitutes.   Eat fresh foods.  Eat more vegetables and fruits.  Limit canned vegetables. If you do use them, rinse them well to decrease the sodium.   Limit cheese to 1 oz (28 g) per day.   Eat lower-sodium products, often labeled as "lower sodium" or "no salt added."  Avoid foods that contain monosodium glutamate (MSG).  MSG is sometimes added to [place] food and some canned foods.  Check food labels (Nutrition Facts labels) on foods to learn how much sodium is in one serving.  Eat more home-cooked food and less restaurant, buffet, and fast food.  When eating at a restaurant, ask that your food be prepared with less salt, or no salt if possible.  HOW DO I READ FOOD LABELS FOR SODIUM INFORMATION? The Nutrition Facts label lists the amount of sodium in one serving of the food. If you eat more than one serving, you must multiply the listed amount of sodium by the number of servings. Food labels may also identify foods as:  Sodium free--Less than 5 mg in a serving.  Very low sodium--35 mg or less in a serving.  Low sodium--140 mg or less in a serving.  Light in sodium--50% less sodium in a serving. For example, if a food that usually has 300 mg of sodium is changed to become light in sodium, it will have 150 mg of sodium.  Reduced sodium--25% less sodium in a serving. For example, if a food that usually has 400 mg of sodium is changed to reduced sodium, it will  have 300 mg of sodium. WHAT FOODS CAN I EAT? Grains Low-sodium cereals, including oats, puffed wheat and rice, and shredded wheat cereals. Low-sodium crackers. Unsalted rice and pasta. Lower-sodium bread.  Vegetables Frozen or fresh vegetables. Low-sodium or reduced-sodium canned vegetables. Low-sodium or reduced-sodium tomato sauce and paste. Low-sodium or reduced-sodium tomato and vegetable juices.  Fruits Fresh, frozen, and canned fruit. Fruit juice.  Meat and Other Protein Products Low-sodium canned tuna and salmon. Fresh or frozen meat, poultry, seafood, and fish. Lamb. Unsalted nuts. Dried beans, peas, and lentils without added salt. Unsalted canned beans. Homemade soups without salt. Eggs.  Dairy Milk. Soy milk. Ricotta cheese. Low-sodium or reduced-sodium cheeses. Yogurt.  Condiments Fresh and dried herbs and spices. Salt-free  seasonings. Onion and garlic powders. Low-sodium varieties of mustard and ketchup. Fresh or refrigerated horseradish. Lemon juice.  Fats and Oils Reduced-sodium salad dressings. Unsalted butter.  Other Unsalted popcorn and pretzels.  The items listed above may not be a complete list of recommended foods or beverages. Contact your dietitian for more options. WHAT FOODS ARE NOT RECOMMENDED? Grains Instant hot cereals. Bread stuffing, pancake, and biscuit mixes. Croutons. Seasoned rice or pasta mixes. Noodle soup cups. Boxed or frozen macaroni and cheese. Self-rising flour. Regular salted crackers. Vegetables Regular canned vegetables. Regular canned tomato sauce and paste. Regular tomato and vegetable juices. Frozen vegetables in sauces. Salted [place] fries. Olives. Angie Fava. Relishes. Sauerkraut. Salsa. Meat and Other Protein Products Salted, canned, smoked, spiced, or pickled meats, seafood, or fish. Bacon, ham, sausage, hot dogs, corned beef, chipped beef, and packaged luncheon meats. Salt pork. Jerky. Pickled herring. Anchovies, regular canned tuna, and sardines. Salted nuts. Dairy Processed cheese and cheese spreads. Cheese curds. Blue cheese and cottage cheese. Buttermilk.  Condiments Onion and garlic salt, seasoned salt, table salt, and sea salt. Canned and packaged gravies. Worcestershire sauce. Tartar sauce. Barbecue sauce. Teriyaki sauce. Soy sauce, including reduced sodium. Steak sauce. Fish sauce. Oyster sauce. Cocktail sauce. Horseradish that you find on the shelf. Regular ketchup and mustard. Meat flavorings and tenderizers. Bouillon cubes. Hot sauce. Tabasco sauce. Marinades. Taco seasonings. Relishes. Fats and Oils Regular salad dressings. Salted butter. Margarine. Ghee. Bacon fat.  Other Potato and tortilla chips. Corn chips and puffs. Salted popcorn and pretzels. Canned or dried soups. Pizza. Frozen entrees and pot pies.  The items listed above may not be a complete  list of foods and beverages to avoid. Contact your dietitian for more information.   This information is not intended to replace advice given to you by your health care provider. Make sure you discuss any questions you have with your health care provider.   Document Released: 01/03/2002 Document Revised: 08/04/2014 Document Reviewed: 05/18/2013 Elsevier Interactive Patient Education 2016 Elsevier Inc.   -  Hypertension Hypertension is another name for high blood pressure. High blood pressure forces your heart to work harder to pump blood. A blood pressure reading has two numbers, which includes a higher number over a lower number (example: 110/72). HOME CARE   Have your blood pressure rechecked by your doctor.  Only take medicine as told by your doctor. Follow the directions carefully. The medicine does not work as well if you skip doses. Skipping doses also puts you at risk for problems.  Do not smoke.  Monitor your blood pressure at home as told by your doctor. GET HELP IF:  You think you are having a reaction to the medicine you are taking.  You have repeat headaches or feel dizzy.  You have  puffiness (swelling) in your ankles.  You have trouble with your vision. GET HELP RIGHT AWAY IF:   You get a very bad headache and are confused.  You feel weak, numb, or faint.  You get chest or belly (abdominal) pain.  You throw up (vomit).  You cannot breathe very well. MAKE SURE YOU:   Understand these instructions.  Will watch your condition.  Will get help right away if you are not doing well or get worse.   This information is not intended to replace advice given to you by your health care provider. Make sure you discuss any questions you have with your health care provider.   Document Released: 12/31/2007 Document Revised: 07/19/2013 Document Reviewed: 05/06/2013 Elsevier Interactive Patient Education [employer].

## 2016-06-09 NOTE — Progress Notes (Signed)
Pt is in the office today for medication refill Pt states she is not in any pain Pt states she is taking medication without difficulty

## 2016-06-09 NOTE — Progress Notes (Addendum)
Theresa [name], is a 70 y.o. female  [id number]  [id number]  DOB - 1946/02/24  Chief Complaint  Patient presents with  . Medication Refill        Subjective:   Theresa [name] is a 70 y.o. female here today for a follow up visit for htn.  Last seen in clinic 5/17.  Pt is stressed out about transportation currently.  Also states she is taking all her meds appropriately.  She notes that sometiimes her heart rate goes down as low as 55 at home.  Her bp is high today, but attributes it to anxiety.  Repeat bp did go down a bit, but still extremely elevated.  Eating well, states she has loss some abd fat.  Wants to rechk her cholesterol first b4 considering statin therapy.  Co of cataracts, asked to see eye doctor, but concerned about cost as well.  Patient has No headache, No chest pain, No abdominal pain - No Nausea, No new weakness tingling or numbness, No Cough - SOB.  Problem  Hyperlipidemia    ALLERGIES: Allergies  Allergen Reactions  . Amlodipine Swelling  . Shellfish Allergy Swelling  . Trazodone And Nefazodone Swelling    Possible eye pain and swelling  . Augmentin [Amoxicillin-Pot Clavulanate]   . Clonidine Derivatives Other (See Comments)    "heart came out of chest"  . Lisinopril-Hydrochlorothiazide Other (See Comments)    Possible flushing, insomnia    PAST MEDICAL HISTORY: Past Medical History:  Diagnosis Date  . Gastric ulcer   . Hypertension   . River blindness   . Sinusitis     MEDICATIONS AT HOME: Prior to Admission medications   Medication Sig Start Date End Date Taking? Authorizing Provider  acetaminophen (TYLENOL) 500 MG tablet Take 500 mg by mouth every 6 (six) hours as needed for mild pain or moderate pain.    Historical Provider, MD  aspirin 81 MG tablet Take 1 tablet (81 mg total) by mouth daily. Reported on 10/02/2015 06/09/16   Maren Reamer, MD  enalapril (VASOTEC) 10 MG tablet 10mg  po bid 06/09/16   Maren Reamer, MD  folic  acid (FOLVITE) A999333 MCG tablet Take 400 mcg by mouth daily.    Historical Provider, MD  ivermectin (STROMECTOL) 3 MG TABS tablet Take 150 mcg/kg by mouth once. Reported on 10/10/2015    Historical Provider, MD  metoprolol tartrate (LOPRESSOR) 25 MG tablet Take 2 tablets in the morning and 1 tablet in the evening 06/09/16   Maren Reamer, MD  Multiple Vitamin (MULTIVITAMIN) tablet Take 1 tablet by mouth daily.    Historical Provider, MD  pantoprazole (PROTONIX) 40 MG tablet Take 1 tablet (40 mg total) by mouth daily. 10/02/15   Maren Reamer, MD  Polyethyl Glycol-Propyl Glycol (EQ LUBRICANT EYE DROPS) 0.4-0.3 % SOLN Apply 2 drops to eye every 2 (two) hours as needed (for burning eyes). Patient not taking: Reported on 06/09/2016 10/24/15   Maren Reamer, MD  pravastatin (PRAVACHOL) 20 MG tablet Take 1 tablet (20 mg total) by mouth daily. 06/09/16   Maren Reamer, MD     Objective:   Vitals:   06/09/16 0917 06/09/16 0950  BP: (!) 200/106 (!) 180/98  Pulse: 64   Resp: 16   Temp: 99 F (37.2 C)   TempSrc: Oral   SpO2: 100%   Weight: 175 lb 9.6 oz (79.7 kg)     Exam General appearance : Awake, alert, not in any distress. Speech Clear. Not toxic  looking, pleasant. HEENT: Atraumatic and Normocephalic, pupils equally reactive to light. Neck: supple, no JVD. No cervical lymphadenopathy.  Chest:Good air entry bilaterally, no added sounds. CVS: S1 S2 regular, no murmurs/gallups or rubs. Abdomen: Bowel sounds active, obese, Non tender and not distended with no gaurding, rigidity or rebound. Extremities: B/L Lower Ext shows no edema, both legs are warm to touch Neurology: Awake alert, and oriented X 3, CN II-XII grossly intact, Non focal Skin:No Rash  Data Review Lab Results  Component Value Date   HGBA1C 5.4 10/02/2015    Depression screen [facility] 2/9 06/09/2016 12/05/2015 10/02/2015  Decreased Interest 0 0 0  Down, Depressed, Hopeless 0 1 0  PHQ - 2 Score 0 1 0  Altered sleeping - 0  -  Tired, decreased energy - 0 -  Change in appetite - 0 -  Feeling bad or failure about yourself  - 0 -  Trouble concentrating - 0 -  Moving slowly or fidgety/restless - 0 -  Suicidal thoughts - 0 -  PHQ-9 Score - 1 -  Difficult doing work/chores - Somewhat difficult -      Assessment & Plan   1. HTN (hypertension), malignant - lengthy discussion w/ pt today., white coat syndrome too? - metoprolol tartrate (LOPRESSOR) 25 MG tablet; Take 2 tablets in the morning and 1 tablet in the evening  Dispense: 180 tablet; Refill: 3 - increase lisinopril to 10bid - low salt diet discussed - asa 81 continued  2. Hyperlipidemia, unspecified hyperlipidemia type Lengthy discussion re benefits - pt's ASCVD risk score >20%, continue asa 81 - would recd statin therapy again, pt wants to chk her lipids first b/4 considering starting - pravastatin 20 ordered - fasting lipids this week. - not currently fasting. - Lipid Panel; Future  3. Anxiety state May worsen her bp as well  4. Cataract, unspecified cataract type, unspecified laterality - Ambulatory referral to Ophthalmology  5. Transportation assistance. - scat? Forms, referred to front desk  6. Health maintenance  recd pneumovac, pt stated had last year prior to coming to [place] - red flu vac, MM, colonoscopy - pt declined all.  Patient have been counseled extensively about nutrition and exercise  Return in about 2 months (around 08/09/2016) for htn.  The patient was given clear instructions to go to ER or return to medical center if symptoms don't improve, worsen or new problems develop. The patient verbalized understanding. The patient was told to call to get lab results if they haven't heard anything in the next week.   This note has been created with Surveyor, quantity. Any transcriptional errors are unintentional.   Maren Reamer, MD, [place] and  [facility] [place], [place]   06/09/2016, 10:57 AM

## 2016-06-11 ENCOUNTER — Ambulatory Visit: Payer: Self-pay | Attending: Internal Medicine

## 2016-06-11 DIAGNOSIS — E785 Hyperlipidemia, unspecified: Secondary | ICD-10-CM | POA: Insufficient documentation

## 2016-06-11 LAB — LIPID PANEL
CHOL/HDL RATIO: 3.4 ratio (ref <5.0)
Cholesterol: 279 mg/dL — ABNORMAL HIGH (ref <200)
HDL: 83 mg/dL (ref >50)
LDL CALC: 183 mg/dL — AB (ref <100)
TRIGLYCERIDES: 64 mg/dL (ref <150)
VLDL: 13 mg/dL (ref <30)

## 2016-06-11 NOTE — Progress Notes (Signed)
Patient here for lab visit only 

## 2016-06-18 ENCOUNTER — Telehealth: Payer: Self-pay | Admitting: Internal Medicine

## 2016-06-18 NOTE — Telephone Encounter (Signed)
Spoke with Chryl Heck the granddaughter about lab results because pt was not understanding the results. Pt granddaughter will come by on Monday to get medications

## 2016-06-18 NOTE — Telephone Encounter (Signed)
Patient asked to speak with nurse to get her lab results. Please follow up.  Thank you.

## 2016-06-25 ENCOUNTER — Telehealth: Payer: Self-pay | Admitting: Internal Medicine

## 2016-06-25 MED FILL — PRAVASTATIN NA 20 MG TAB: 20 | 30 days supply | Qty: 30 | Fill #0

## 2016-06-25 NOTE — Telephone Encounter (Signed)
Patient would like to speak to MA regarding lab results and would like to discuss Rx Pravastatin. Patient stated has concerns regarding medication Please follow up with patient.

## 2016-07-10 ENCOUNTER — Encounter: Payer: Self-pay | Admitting: Internal Medicine

## 2016-07-24 MED FILL — PRAVASTATIN NA 20 MG TAB: 20 | 60 days supply | Qty: 60 | Fill #1

## 2016-09-08 MED FILL — METOPROLOL TARTRATE 25 MG T: 25 | 90 days supply | Qty: 270 | Fill #3

## 2016-09-08 MED FILL — ENALAPRIL MALEATE 10 MG TAB: 10 | 90 days supply | Qty: 180 | Fill #1

## 2016-09-22 MED FILL — PRAVASTATIN NA 20 MG TAB: 20 | 60 days supply | Qty: 60 | Fill #2

## 2016-09-25 MED FILL — LUMIGAN 0.01% EYE DROPS: 0.01 | 15 days supply | Qty: 3 | Fill #0

## 2016-10-01 ENCOUNTER — Ambulatory Visit: Payer: Self-pay | Attending: Internal Medicine | Admitting: Internal Medicine

## 2016-10-01 VITALS — BP 187/100 | HR 69 | Temp 98.2°F | Resp 16 | Wt 172.2 lb

## 2016-10-01 DIAGNOSIS — Z880 Allergy status to penicillin: Secondary | ICD-10-CM | POA: Insufficient documentation

## 2016-10-01 DIAGNOSIS — I10 Essential (primary) hypertension: Secondary | ICD-10-CM

## 2016-10-01 DIAGNOSIS — Z7982 Long term (current) use of aspirin: Secondary | ICD-10-CM | POA: Insufficient documentation

## 2016-10-01 DIAGNOSIS — Z1159 Encounter for screening for other viral diseases: Secondary | ICD-10-CM

## 2016-10-01 DIAGNOSIS — E785 Hyperlipidemia, unspecified: Secondary | ICD-10-CM

## 2016-10-01 DIAGNOSIS — Z1389 Encounter for screening for other disorder: Secondary | ICD-10-CM | POA: Insufficient documentation

## 2016-10-01 DIAGNOSIS — Z1321 Encounter for screening for nutritional disorder: Secondary | ICD-10-CM

## 2016-10-01 DIAGNOSIS — Z79899 Other long term (current) drug therapy: Secondary | ICD-10-CM | POA: Insufficient documentation

## 2016-10-01 DIAGNOSIS — F439 Reaction to severe stress, unspecified: Secondary | ICD-10-CM

## 2016-10-01 MED ORDER — ENALAPRIL MALEATE 10 MG PO TABS: ORAL_TABLET | ORAL | 3 refills | Status: DC

## 2016-10-01 MED ORDER — HYDRALAZINE HCL 10 MG PO TABS: 10.0000 mg | ORAL_TABLET | Freq: Three times a day (TID) | ORAL | 3 refills | Status: DC

## 2016-10-01 MED ORDER — METOPROLOL TARTRATE 25 MG PO TABS: ORAL_TABLET | ORAL | 3 refills | Status: DC

## 2016-10-01 MED ORDER — PRAVASTATIN SODIUM 20 MG PO TABS
20.0000 mg | ORAL_TABLET | Freq: Every day | ORAL | 3 refills | Status: DC
Start: 2016-10-01 — End: 2017-01-15

## 2016-10-01 MED FILL — hydrALAZINE HCL 10 MG TABS: 10 | 90 days supply | Qty: 270 | Fill #0

## 2016-10-01 MED FILL — PRAVASTATIN NA 20 MG TAB: 20 | 30 days supply | Qty: 30 | Fill #0

## 2016-10-01 NOTE — Progress Notes (Signed)
Patient refused to have lab work drawn after doctor's visit.

## 2016-10-01 NOTE — Progress Notes (Signed)
Theresa [name], is a 71 y.o. female  [id number]  [id number]  DOB - 19-May-1946  Chief Complaint  Patient presents with  . Hypertension        Subjective:   Theresa [name] is a 71 y.o. female here today for a follow up visit for htn. She remains very stressed about her financial burden, her green card is expiring and going through process to renew as well.  Has difficulty coming for f/u appt due to transportation issues.  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  Holding off on surg due to cost.   Patient has No headache, No chest pain, No abdominal pain - No Nausea, No new weakness tingling or numbness, No Cough - SOB.  No problems updated.  ALLERGIES: Allergies  Allergen Reactions  . Amlodipine Swelling  . Shellfish Allergy Swelling  . Trazodone And Nefazodone Swelling    Possible eye pain and swelling  . Augmentin [Amoxicillin-Pot Clavulanate]   . Clonidine Derivatives Other (See Comments)    "heart came out of chest"  . Lisinopril-Hydrochlorothiazide Other (See Comments)    Possible flushing, insomnia    PAST MEDICAL HISTORY: Past Medical History:  Diagnosis Date  . Gastric ulcer   . Hypertension   . River blindness   . Sinusitis     MEDICATIONS AT HOME: Prior to Admission medications   Medication Sig Start Date End Date Taking? Authorizing Provider  acetaminophen (TYLENOL) 500 MG tablet Take 500 mg by mouth every 6 (six) hours as needed for mild pain or moderate pain.    Historical Provider, MD  aspirin 81 MG tablet Take 1 tablet (81 mg total) by mouth daily. Reported on 10/02/2015 06/09/16   Maren Reamer, MD  enalapril (VASOTEC) 10 MG tablet 10mg  po bid 10/01/16   Maren Reamer, MD  folic acid (FOLVITE) 426 MCG tablet Take 400 mcg by mouth daily.    Historical Provider, MD  hydrALAZINE (APRESOLINE) 10 MG tablet Take 1 tablet (10 mg total) by mouth 3 (three) times daily. 10/01/16   Maren Reamer, MD  ivermectin (STROMECTOL) 3  MG TABS tablet Take 150 mcg/kg by mouth once. Reported on 10/10/2015    Historical Provider, MD  metoprolol tartrate (LOPRESSOR) 25 MG tablet Take 2 tablets in the morning and 1 tablet in the evening 10/01/16   Maren Reamer, MD  Multiple Vitamin (MULTIVITAMIN) tablet Take 1 tablet by mouth daily.    Historical Provider, MD  pantoprazole (PROTONIX) 40 MG tablet Take 1 tablet (40 mg total) by mouth daily. 10/02/15   Maren Reamer, MD  Polyethyl Glycol-Propyl Glycol (EQ LUBRICANT EYE DROPS) 0.4-0.3 % SOLN Apply 2 drops to eye every 2 (two) hours as needed (for burning eyes). Patient not taking: Reported on 06/09/2016 10/24/15   Maren Reamer, MD  pravastatin (PRAVACHOL) 20 MG tablet Take 1 tablet (20 mg total) by mouth daily. 10/01/16   Maren Reamer, MD     Objective:   Vitals:   10/01/16 0907  BP: (!) 187/100  Pulse: 69  Resp: 16  Temp: 98.2 F (36.8 C)  TempSrc: Oral  SpO2: 100%  Weight: 172 lb 3.2 oz (78.1 kg)    Exam General appearance : Awake, alert, not in any distress. Speech Clear. Not toxic looking, pleasant but remains stressed over situation. HEENT: Atraumatic and Normocephalic, pupils equally reactive to light. Neck: supple, no JVD.  Chest:Good air entry bilaterally, no added sounds. CVS: S1 S2 regular, no murmurs/gallups  or rubs. Abdomen: Bowel sounds active, Non tender and not distended with no gaurding, rigidity or rebound. Extremities: B/L Lower Ext shows no edema, both legs are warm to touch Neurology: Awake alert, and oriented X 3, CN II-XII grossly intact, Non focal Skin:No Rash  Data Review Lab Results  Component Value Date   HGBA1C 5.4 10/02/2015    Depression screen [facility] 2/9 10/01/2016 06/09/2016 12/05/2015 10/02/2015  Decreased Interest 0 0 0 0  Down, Depressed, Hopeless 0 0 1 0  PHQ - 2 Score 0 0 1 0  Altered sleeping - - 0 -  Tired, decreased energy - - 0 -  Change in appetite - - 0 -  Feeling bad or failure about yourself  - - 0 -  Trouble  concentrating - - 0 -  Moving slowly or fidgety/restless - - 0 -  Suicidal thoughts - - 0 -  PHQ-9 Score - - 1 -  Difficult doing work/chores - - Somewhat difficult -      Assessment & Plan   1. HTN (hypertension), accelerated - per pt, just took her bp meds prior to arrival - remains very stressed over financial burden and green card status, which is worsening his bp as well. - BASIC METABOLIC PANEL WITH GFR - CBC with Differential - metoprolol tartrate (LOPRESSOR) 25 MG tablet; Take 2 tablets in the morning and 1 tablet in the evening  Dispense: 180 tablet; Refill: 3 - continue  Enalapril 10bid - add hydralazine 10tid, - fu RN [place] 2 wks bp check, if sbp >130, than increase hydralazine 25tid, and make appt to f/u w/ me in 1 month.   2. Hyperlipidemia, unspecified hyperlipidemia type - to pravastatin, renewed, continue asa81 qd  3. Stress, situational - worsening bp, recd trial lexapro, but pt is not interested. - denies si/hi/avh  4. Encounter for vitamin deficiency screening - VITAMIN D 25 Hydroxy (Vit-D Deficiency, Fractures)  5. Need for hepatitis C screening test - Hepatitis C antibody  6/ pt declined labs after visit due to cost.  cma discussed scholarship, but pt declined as well.   Patient have been counseled extensively about nutrition and exercise  Return in about 4 weeks (around 10/29/2016) for accell htn.  The patient was given clear instructions to go to ER or return to medical center if symptoms don't improve, worsen or new problems develop. The patient verbalized understanding. The patient was told to call to get lab results if they haven't heard anything in the next week.   This note has been created with Surveyor, quantity. Any transcriptional errors are unintentional.   Maren Reamer, MD, [place] and [facility] [place], [place]   10/01/2016, 9:46 AM

## 2016-10-01 NOTE — Patient Instructions (Signed)
Theresa Munson RN 2 wks for bp check  -   Hypertension Hypertension is another name for high blood pressure. High blood pressure forces your heart to work harder to pump blood. This can cause problems over time. There are two numbers in a blood pressure reading. There is a top number (systolic) over a bottom number (diastolic). It is best to have a blood pressure below 120/80. Healthy choices can help lower your blood pressure. You may need medicine to help lower your blood pressure if:  Your blood pressure cannot be lowered with healthy choices.  Your blood pressure is higher than 130/80. Follow these instructions at home: Eating and drinking   If directed, follow the DASH eating plan. This diet includes:  Filling half of your plate at each meal with fruits and vegetables.  Filling one quarter of your plate at each meal with whole grains. Whole grains include whole wheat pasta, brown rice, and whole grain bread.  Eating or drinking low-fat dairy products, such as skim milk or low-fat yogurt.  Filling one quarter of your plate at each meal with low-fat (lean) proteins. Low-fat proteins include fish, skinless chicken, eggs, beans, and tofu.  Avoiding fatty meat, cured and processed meat, or chicken with skin.  Avoiding premade or processed food.  Eat less than 1,500 mg of salt (sodium) a day.  Limit alcohol use to no more than 1 drink a day for nonpregnant women and 2 drinks a day for men. One drink equals 12 oz of beer, 5 oz of wine, or 1 oz of hard liquor. Lifestyle   Work with your doctor to stay at a healthy weight or to lose weight. Ask your doctor what the best weight is for you.  Get at least 30 minutes of exercise that causes your heart to beat faster (aerobic exercise) most days of the week. This may include walking, swimming, or biking.  Get at least 30 minutes of exercise that strengthens your muscles (resistance exercise) at least 3 days a week. This may include lifting  weights or pilates.  Do not use any products that contain nicotine or tobacco. This includes cigarettes and e-cigarettes. If you need help quitting, ask your doctor.  Check your blood pressure at home as told by your doctor.  Keep all follow-up visits as told by your doctor. This is important. Medicines   Take over-the-counter and prescription medicines only as told by your doctor. Follow directions carefully.  Do not skip doses of blood pressure medicine. The medicine does not work as well if you skip doses. Skipping doses also puts you at risk for problems.  Ask your doctor about side effects or reactions to medicines that you should watch for. Contact a doctor if:  You think you are having a reaction to the medicine you are taking.  You have headaches that keep coming back (recurring).  You feel dizzy.  You have swelling in your ankles.  You have trouble with your vision. Get help right away if:  You get a very bad headache.  You start to feel confused.  You feel weak or numb.  You feel faint.  You get very bad pain in your:  Chest.  Belly (abdomen).  You throw up (vomit) more than once.  You have trouble breathing. Summary  Hypertension is another name for high blood pressure.  Making healthy choices can help lower blood pressure. If your blood pressure cannot be controlled with healthy choices, you may need to take medicine.  This information is not intended to replace advice given to you by your health care provider. Make sure you discuss any questions you have with your health care provider. Document Released: 12/31/2007 Document Revised: 06/11/2016 Document Reviewed: 06/11/2016 Elsevier Interactive Patient Education  2017 [employer]  -   Low-Sodium Eating Plan Sodium, which is an element that makes up salt, helps you maintain a healthy balance of fluids in your body. Too much sodium can increase your blood pressure and cause fluid and waste to be  held in your body. Your health care provider or dietitian may recommend following this plan if you have high blood pressure (hypertension), kidney disease, liver disease, or heart failure. Eating less sodium can help lower your blood pressure, reduce swelling, and protect your heart, liver, and kidneys. What are tips for following this plan? General guidelines   Most people on this plan should limit their sodium intake to 1,500-2,000 mg (milligrams) of sodium each day. Reading food labels   The Nutrition Facts label lists the amount of sodium in one serving of the food. If you eat more than one serving, you must multiply the listed amount of sodium by the number of servings.  Choose foods with less than 140 mg of sodium per serving.  Avoid foods with 300 mg of sodium or more per serving. Shopping   Look for lower-sodium products, often labeled as "low-sodium" or "no salt added."  Always check the sodium content even if foods are labeled as "unsalted" or "no salt added".  Buy fresh foods.  Avoid canned foods and premade or frozen meals.  Avoid canned, cured, or processed meats  Buy breads that have less than 80 mg of sodium per slice. Cooking   Eat more home-cooked food and less restaurant, buffet, and fast food.  Avoid adding salt when cooking. Use salt-free seasonings or herbs instead of table salt or sea salt. Check with your health care provider or pharmacist before using salt substitutes.  Cook with plant-based oils, such as canola, sunflower, or olive oil. Meal planning   When eating at a restaurant, ask that your food be prepared with less salt or no salt, if possible.  Avoid foods that contain MSG (monosodium glutamate). MSG is sometimes added to [place] food, bouillon, and some canned foods. What foods are recommended? The items listed may not be a complete list. Talk with your dietitian about what dietary choices are best for you. Grains  Low-sodium cereals,  including oats, puffed wheat and rice, and shredded wheat. Low-sodium crackers. Unsalted rice. Unsalted pasta. Low-sodium bread. Whole-grain breads and whole-grain pasta. Vegetables  Fresh or frozen vegetables. "No salt added" canned vegetables. "No salt added" tomato sauce and paste. Low-sodium or reduced-sodium tomato and vegetable juice. Fruits  Fresh, frozen, or canned fruit. Fruit juice. Meats and other protein foods  Fresh or frozen (no salt added) meat, poultry, seafood, and fish. Low-sodium canned tuna and salmon. Unsalted nuts. Dried peas, beans, and lentils without added salt. Unsalted canned beans. Eggs. Unsalted nut butters. Dairy  Milk. Soy milk. Cheese that is naturally low in sodium, such as ricotta cheese, fresh mozzarella, or Swiss cheese Low-sodium or reduced-sodium cheese. Cream cheese. Yogurt. Fats and oils  Unsalted butter. Unsalted margarine with no trans fat. Vegetable oils such as canola or olive oils. Seasonings and other foods  Fresh and dried herbs and spices. Salt-free seasonings. Low-sodium mustard and ketchup. Sodium-free salad dressing. Sodium-free light mayonnaise. Fresh or refrigerated horseradish. Lemon juice. Vinegar. Homemade, reduced-sodium, or low-sodium  soups. Unsalted popcorn and pretzels. Low-salt or salt-free chips. What foods are not recommended? The items listed may not be a complete list. Talk with your dietitian about what dietary choices are best for you. Grains  Instant hot cereals. Bread stuffing, pancake, and biscuit mixes. Croutons. Seasoned rice or pasta mixes. Noodle soup cups. Boxed or frozen macaroni and cheese. Regular salted crackers. Self-rising flour. Vegetables  Sauerkraut, pickled vegetables, and relishes. Olives. [place] fries. Onion rings. Regular canned vegetables (not low-sodium or reduced-sodium). Regular canned tomato sauce and paste (not low-sodium or reduced-sodium). Regular tomato and vegetable juice (not low-sodium or  reduced-sodium). Frozen vegetables in sauces. Meats and other protein foods  Meat or fish that is salted, canned, smoked, spiced, or pickled. Bacon, ham, sausage, hotdogs, corned beef, chipped beef, packaged lunch meats, salt pork, jerky, pickled herring, anchovies, regular canned tuna, sardines, salted nuts. Dairy  Processed cheese and cheese spreads. Cheese curds. Blue cheese. Feta cheese. String cheese. Regular cottage cheese. Buttermilk. Canned milk. Fats and oils  Salted butter. Regular margarine. Ghee. Bacon fat. Seasonings and other foods  Onion salt, garlic salt, seasoned salt, table salt, and sea salt. Canned and packaged gravies. Worcestershire sauce. Tartar sauce. Barbecue sauce. Teriyaki sauce. Soy sauce, including reduced-sodium. Steak sauce. Fish sauce. Oyster sauce. Cocktail sauce. Horseradish that you find on the shelf. Regular ketchup and mustard. Meat flavorings and tenderizers. Bouillon cubes. Hot sauce and Tabasco sauce. Premade or packaged marinades. Premade or packaged taco seasonings. Relishes. Regular salad dressings. Salsa. Potato and tortilla chips. Corn chips and puffs. Salted popcorn and pretzels. Canned or dried soups. Pizza. Frozen entrees and pot pies. Summary  Eating less sodium can help lower your blood pressure, reduce swelling, and protect your heart, liver, and kidneys.  Most people on this plan should limit their sodium intake to 1,500-2,000 mg (milligrams) of sodium each day.  Canned, boxed, and frozen foods are high in sodium. Restaurant foods, fast foods, and pizza are also very high in sodium. You also get sodium by adding salt to food.  Try to cook at home, eat more fresh fruits and vegetables, and eat less fast food, canned, processed, or prepared foods. This information is not intended to replace advice given to you by your health care provider. Make sure you discuss any questions you have with your health care provider. Document Released: 01/03/2002  Document Revised: 07/07/2016 Document Reviewed: 07/07/2016 Elsevier Interactive Patient Education  2017 [employer].

## 2016-11-03 ENCOUNTER — Ambulatory Visit: Payer: Self-pay | Attending: Internal Medicine | Admitting: Internal Medicine

## 2016-11-03 VITALS — BP 194/100 | HR 86 | Temp 98.1°F | Resp 16 | Wt 174.8 lb

## 2016-11-03 DIAGNOSIS — Z91013 Allergy to seafood: Secondary | ICD-10-CM | POA: Insufficient documentation

## 2016-11-03 DIAGNOSIS — F439 Reaction to severe stress, unspecified: Secondary | ICD-10-CM | POA: Insufficient documentation

## 2016-11-03 DIAGNOSIS — E785 Hyperlipidemia, unspecified: Secondary | ICD-10-CM | POA: Insufficient documentation

## 2016-11-03 DIAGNOSIS — Z7982 Long term (current) use of aspirin: Secondary | ICD-10-CM | POA: Insufficient documentation

## 2016-11-03 DIAGNOSIS — I10 Essential (primary) hypertension: Secondary | ICD-10-CM | POA: Insufficient documentation

## 2016-11-03 DIAGNOSIS — Z79899 Other long term (current) drug therapy: Secondary | ICD-10-CM | POA: Insufficient documentation

## 2016-11-03 DIAGNOSIS — Z880 Allergy status to penicillin: Secondary | ICD-10-CM | POA: Insufficient documentation

## 2016-11-03 MED ORDER — POLYETHYL GLYCOL-PROPYL GLYCOL 0.4-0.3 % OP SOLN: 11.0000 [drp] | OPHTHALMIC | 11 refills | Status: DC | PRN

## 2016-11-03 NOTE — Progress Notes (Signed)
Theresa [name], is a 71 y.o. female  [id number]  [id number]  DOB - November 23, 1945  Chief Complaint  Patient presents with  . Hypertension  . Headache        Subjective:   Theresa [name] is a 71 y.o. female here today for a follow up visit of htn. Of note, she started taking hydralazine few days after taking it due to tachycardia. Tachycardia resolved after stopping. She again notes high bp today, notes lots of worrying and stress is what causes her bp to be high. She worries a lot about finances.  She just took her bp meds this am.  Pt notes sleeps on her right side more, and notes more varicose veins in that leg.  Patient has No headache, No chest pain, No abdominal pain - No Nausea, No new weakness tingling or numbness, No Cough - SOB.  No problems updated.  ALLERGIES: Allergies  Allergen Reactions  . Amlodipine Swelling  . Shellfish Allergy Swelling  . Trazodone And Nefazodone Swelling    Possible eye pain and swelling  . Augmentin [Amoxicillin-Pot Clavulanate]   . Clonidine Derivatives Other (See Comments)    "heart came out of chest"  . Lisinopril-Hydrochlorothiazide Other (See Comments)    Possible flushing, insomnia    PAST MEDICAL HISTORY: Past Medical History:  Diagnosis Date  . Gastric ulcer   . Hypertension   . River blindness   . Sinusitis     MEDICATIONS AT HOME: Prior to Admission medications   Medication Sig Start Date End Date Taking? Authorizing Provider  acetaminophen (TYLENOL) 500 MG tablet Take 500 mg by mouth every 6 (six) hours as needed for mild pain or moderate pain.    Historical Provider, MD  aspirin 81 MG tablet Take 1 tablet (81 mg total) by mouth daily. Reported on 10/02/2015 06/09/16   Maren Reamer, MD  enalapril (VASOTEC) 10 MG tablet 10mg  po bid 10/01/16   Maren Reamer, MD  folic acid (FOLVITE) 213 MCG tablet Take 400 mcg by mouth daily.    Historical Provider, MD  hydrALAZINE (APRESOLINE) 10 MG tablet Take 1 tablet (10 mg  total) by mouth 3 (three) times daily. 10/01/16   Maren Reamer, MD  ivermectin (STROMECTOL) 3 MG TABS tablet Take 150 mcg/kg by mouth once. Reported on 10/10/2015    Historical Provider, MD  metoprolol tartrate (LOPRESSOR) 25 MG tablet Take 2 tablets in the morning and 1 tablet in the evening 10/01/16   Maren Reamer, MD  Multiple Vitamin (MULTIVITAMIN) tablet Take 1 tablet by mouth daily.    Historical Provider, MD  pantoprazole (PROTONIX) 40 MG tablet Take 1 tablet (40 mg total) by mouth daily. 10/02/15   Maren Reamer, MD  Polyethyl Glycol-Propyl Glycol (EQ LUBRICANT EYE DROPS) 0.4-0.3 % SOLN Apply 11 drops to eye every 2 (two) hours as needed (for burning eyes). 11/03/16   Maren Reamer, MD  pravastatin (PRAVACHOL) 20 MG tablet Take 1 tablet (20 mg total) by mouth daily. 10/01/16   Maren Reamer, MD     Objective:   Vitals:   11/03/16 0825  BP: (!) 198/118  Pulse: 86  Resp: 16  Temp: 98.1 F (36.7 C)  TempSrc: Oral  SpO2: 98%  Weight: 174 lb 12.8 oz (79.3 kg)    Exam General appearance : Awake, alert, not in any distress. Speech Clear. Not toxic looking, pleasant. No distress. HEENT: Atraumatic and Normocephalic, pupils equally reactive to light. Neck: supple, no JVD. No cervical lymphadenopathy.  Chest:Good air entry bilaterally, no added sounds. CVS: S1 S2 regular, no murmurs/gallups or rubs. Abdomen: Bowel sounds active, Non tender and not distended with no gaurding, rigidity or rebound. Extremities: B/L Lower Ext shows no edema, both legs are warm to touch, more varicose veins noted in right thigh compared to left. Neurology: Awake alert, and oriented X 3, CN II-XII grossly intact, Non focal Skin:No Rash  Data Review Lab Results  Component Value Date   HGBA1C 5.4 10/02/2015    Depression screen [facility] 2/9 11/03/2016 10/01/2016 06/09/2016 12/05/2015 10/02/2015  Decreased Interest 0 0 0 0 0  Down, Depressed, Hopeless 0 0 0 1 0  PHQ - 2 Score 0 0 0 1 0  Altered sleeping -  - - 0 -  Tired, decreased energy - - - 0 -  Change in appetite - - - 0 -  Feeling bad or failure about yourself  - - - 0 -  Trouble concentrating - - - 0 -  Moving slowly or fidgety/restless - - - 0 -  Suicidal thoughts - - - 0 -  PHQ-9 Score - - - 1 -  Difficult doing work/chores - - - Somewhat difficult -      Assessment & Plan   1. HTN (hypertension), malignant Repeat remains elevated.  Intolerant of clonidine. - will not change rx for now - instructed to try taking hydralazine again with her metoprolol and enalapril - Basic metabolic panel - rn [place] bp check next week - if sbp >130, and cr normal, increase enalapril to 15mg  po bid.  If intolerant of hydralazine, discontinue it.  2. Hyperlipidemia, unspecified hyperlipidemia type Cont asa 81 and pravastatin 20 qhs.  3. Stress contributing to her bp - relaxation tecniques discussed in clinic today, walking helps greatly.     Patient have been counseled extensively about nutrition and exercise  Return in about 2 weeks (around 11/17/2016) for htn.  The patient was given clear instructions to go to ER or return to medical center if symptoms don't improve, worsen or new problems develop. The patient verbalized understanding. The patient was told to call to get lab results if they haven't heard anything in the next week.   This note has been created with Surveyor, quantity. Any transcriptional errors are unintentional.   Maren Reamer, MD, [place] [facility] [place], [place]   11/03/2016, 8:56 AM

## 2016-11-03 NOTE — Patient Instructions (Addendum)
Travia BP check 1 wk.   Try hydralazine again, if fast heart rate does not go away after taking it for 1 week, please stop and make appointment.   Hydralazine tablets What is this medicine? HYDRALAZINE (hye DRAL a zeen) is a type of vasodilator. It relaxes blood vessels, increasing the blood and oxygen supply to your heart. This medicine is used to treat high blood pressure. This medicine may be used for other purposes; ask your health care provider or pharmacist if you have questions. COMMON BRAND NAME(S): Apresoline What should I tell my health care provider before I take this medicine? They need to know if you have any of these conditions: -blood vessel disease -heart disease including angina or history of heart attack -kidney or liver disease -systemic lupus erythematosus (SLE) -an unusual or allergic reaction to hydralazine, tartrazine dye, other medicines, foods, dyes, or preservatives -pregnant or trying to get pregnant -breast-feeding How should I use this medicine? Take this medicine by mouth with a glass of water. Follow the directions on the prescription label. Take your doses at regular intervals. Do not take your medicine more often than directed. Do not stop taking except on the advice of your doctor or health care professional. Talk to your pediatrician regarding the use of this medicine in children. Special care may be needed. While this drug may be prescribed for children for selected conditions, precautions do apply. Overdosage: If you think you have taken too much of this medicine contact a poison control center or emergency room at once. NOTE: This medicine is only for you. Do not share this medicine with others. What if I miss a dose? If you miss a dose, take it as soon as you can. If it is almost time for your next dose, take only that dose. Do not take double or extra doses. What may interact with this medicine? -medicines for high blood pressure -medicines for  mental depression This list may not describe all possible interactions. Give your health care provider a list of all the medicines, herbs, non-prescription drugs, or dietary supplements you use. Also tell them if you smoke, drink alcohol, or use illegal drugs. Some items may interact with your medicine. What should I watch for while using this medicine? Visit your doctor or health care professional for regular checks on your progress. Check your blood pressure and pulse rate regularly. Ask your doctor or health care professional what your blood pressure and pulse rate should be and when you should contact him or her. You may get drowsy or dizzy. Do not drive, use machinery, or do anything that needs mental alertness until you know how this medicine affects you. Do not stand or sit up quickly, especially if you are an older patient. This reduces the risk of dizzy or fainting spells. Alcohol may interfere with the effect of this medicine. Avoid alcoholic drinks. Do not treat yourself for coughs, colds, or pain while you are taking this medicine without asking your doctor or health care professional for advice. Some ingredients may increase your blood pressure. What side effects may I notice from receiving this medicine? Side effects that you should report to your doctor or health care professional as soon as possible: -chest pain, or fast or irregular heartbeat -fever, chills, or sore throat -numbness or tingling in the hands or feet -shortness of breath -skin rash, redness, blisters or itching -stiff or swollen joints -sudden weight gain -swelling of the feet or legs -swollen lymph glands -unusual weakness Side  effects that usually do not require medical attention (report to your doctor or health care professional if they continue or are bothersome): -diarrhea, or constipation -headache -loss of appetite -nausea, vomiting This list may not describe all possible side effects. Call your doctor  for medical advice about side effects. You may report side effects to FDA at 1-800-FDA-1088. Where should I keep my medicine? Keep out of the reach of children. Store at room temperature between 15 and 30 degrees C (59 and 86 degrees F). Throw away any unused medicine after the expiration date. NOTE: This sheet is a summary. It may not cover all possible information. If you have questions about this medicine, talk to your doctor, pharmacist, or health care provider.  2018 Elsevier/Gold Standard (2007-11-26 15:44:58)   -   Low-Sodium Eating Plan Sodium, which is an element that makes up salt, helps you maintain a healthy balance of fluids in your body. Too much sodium can increase your blood pressure and cause fluid and waste to be held in your body. Your health care provider or dietitian may recommend following this plan if you have high blood pressure (hypertension), kidney disease, liver disease, or heart failure. Eating less sodium can help lower your blood pressure, reduce swelling, and protect your heart, liver, and kidneys. What are tips for following this plan? General guidelines   Most people on this plan should limit their sodium intake to 1,500-2,000 mg (milligrams) of sodium each day. Reading food labels   The Nutrition Facts label lists the amount of sodium in one serving of the food. If you eat more than one serving, you must multiply the listed amount of sodium by the number of servings.  Choose foods with less than 140 mg of sodium per serving.  Avoid foods with 300 mg of sodium or more per serving. Shopping   Look for lower-sodium products, often labeled as "low-sodium" or "no salt added."  Always check the sodium content even if foods are labeled as "unsalted" or "no salt added".  Buy fresh foods.  Avoid canned foods and premade or frozen meals.  Avoid canned, cured, or processed meats  Buy breads that have less than 80 mg of sodium per slice. Cooking   Eat  more home-cooked food and less restaurant, buffet, and fast food.  Avoid adding salt when cooking. Use salt-free seasonings or herbs instead of table salt or sea salt. Check with your health care provider or pharmacist before using salt substitutes.  Cook with plant-based oils, such as canola, sunflower, or olive oil. Meal planning   When eating at a restaurant, ask that your food be prepared with less salt or no salt, if possible.  Avoid foods that contain MSG (monosodium glutamate). MSG is sometimes added to [place] food, bouillon, and some canned foods. What foods are recommended? The items listed may not be a complete list. Talk with your dietitian about what dietary choices are best for you. Grains  Low-sodium cereals, including oats, puffed wheat and rice, and shredded wheat. Low-sodium crackers. Unsalted rice. Unsalted pasta. Low-sodium bread. Whole-grain breads and whole-grain pasta. Vegetables  Fresh or frozen vegetables. "No salt added" canned vegetables. "No salt added" tomato sauce and paste. Low-sodium or reduced-sodium tomato and vegetable juice. Fruits  Fresh, frozen, or canned fruit. Fruit juice. Meats and other protein foods  Fresh or frozen (no salt added) meat, poultry, seafood, and fish. Low-sodium canned tuna and salmon. Unsalted nuts. Dried peas, beans, and lentils without added salt. Unsalted canned beans. Eggs.  Unsalted nut butters. Dairy  Milk. Soy milk. Cheese that is naturally low in sodium, such as ricotta cheese, fresh mozzarella, or Swiss cheese Low-sodium or reduced-sodium cheese. Cream cheese. Yogurt. Fats and oils  Unsalted butter. Unsalted margarine with no trans fat. Vegetable oils such as canola or olive oils. Seasonings and other foods  Fresh and dried herbs and spices. Salt-free seasonings. Low-sodium mustard and ketchup. Sodium-free salad dressing. Sodium-free light mayonnaise. Fresh or refrigerated horseradish. Lemon juice. Vinegar. Homemade,  reduced-sodium, or low-sodium soups. Unsalted popcorn and pretzels. Low-salt or salt-free chips. What foods are not recommended? The items listed may not be a complete list. Talk with your dietitian about what dietary choices are best for you. Grains  Instant hot cereals. Bread stuffing, pancake, and biscuit mixes. Croutons. Seasoned rice or pasta mixes. Noodle soup cups. Boxed or frozen macaroni and cheese. Regular salted crackers. Self-rising flour. Vegetables  Sauerkraut, pickled vegetables, and relishes. Olives. [place] fries. Onion rings. Regular canned vegetables (not low-sodium or reduced-sodium). Regular canned tomato sauce and paste (not low-sodium or reduced-sodium). Regular tomato and vegetable juice (not low-sodium or reduced-sodium). Frozen vegetables in sauces. Meats and other protein foods  Meat or fish that is salted, canned, smoked, spiced, or pickled. Bacon, ham, sausage, hotdogs, corned beef, chipped beef, packaged lunch meats, salt pork, jerky, pickled herring, anchovies, regular canned tuna, sardines, salted nuts. Dairy  Processed cheese and cheese spreads. Cheese curds. Blue cheese. Feta cheese. String cheese. Regular cottage cheese. Buttermilk. Canned milk. Fats and oils  Salted butter. Regular margarine. Ghee. Bacon fat. Seasonings and other foods  Onion salt, garlic salt, seasoned salt, table salt, and sea salt. Canned and packaged gravies. Worcestershire sauce. Tartar sauce. Barbecue sauce. Teriyaki sauce. Soy sauce, including reduced-sodium. Steak sauce. Fish sauce. Oyster sauce. Cocktail sauce. Horseradish that you find on the shelf. Regular ketchup and mustard. Meat flavorings and tenderizers. Bouillon cubes. Hot sauce and Tabasco sauce. Premade or packaged marinades. Premade or packaged taco seasonings. Relishes. Regular salad dressings. Salsa. Potato and tortilla chips. Corn chips and puffs. Salted popcorn and pretzels. Canned or dried soups. Pizza. Frozen entrees and pot  pies. Summary  Eating less sodium can help lower your blood pressure, reduce swelling, and protect your heart, liver, and kidneys.  Most people on this plan should limit their sodium intake to 1,500-2,000 mg (milligrams) of sodium each day.  Canned, boxed, and frozen foods are high in sodium. Restaurant foods, fast foods, and pizza are also very high in sodium. You also get sodium by adding salt to food.  Try to cook at home, eat more fresh fruits and vegetables, and eat less fast food, canned, processed, or prepared foods. This information is not intended to replace advice given to you by your health care provider. Make sure you discuss any questions you have with your health care provider. Document Released: 01/03/2002 Document Revised: 07/07/2016 Document Reviewed: 07/07/2016 Elsevier Interactive Patient Education  2017 [employer].   -

## 2016-11-04 LAB — BASIC METABOLIC PANEL
BUN / CREAT RATIO: 13 (ref 12–28)
BUN: 11 mg/dL (ref 8–27)
CALCIUM: 9.4 mg/dL (ref 8.7–10.3)
CHLORIDE: 103 mmol/L (ref 96–106)
CO2: 25 mmol/L (ref 18–29)
Creatinine, Ser: 0.87 mg/dL (ref 0.57–1.00)
GFR, EST AFRICAN AMERICAN: 77 mL/min/{1.73_m2} (ref >59)
GFR, EST NON AFRICAN AMERICAN: 67 mL/min/{1.73_m2} (ref >59)
Glucose: 90 mg/dL (ref 65–99)
POTASSIUM: 4.3 mmol/L (ref 3.5–5.2)
SODIUM: 142 mmol/L (ref 134–144)

## 2016-11-05 ENCOUNTER — Telehealth: Payer: Self-pay

## 2016-11-05 NOTE — Telephone Encounter (Signed)
Contacted pt to go over lab results. Pt states she is aware of results and doesn't have any questions or concerns. Pt states she made the appointment with Dr. Janne Napoleon but when she didn't make one with the rn cause there wasn't no time available that fits her availability

## 2016-11-19 ENCOUNTER — Ambulatory Visit: Payer: Self-pay | Admitting: Internal Medicine

## 2016-11-24 ENCOUNTER — Ambulatory Visit: Payer: Self-pay | Admitting: Internal Medicine

## 2016-11-24 ENCOUNTER — Ambulatory Visit: Payer: Self-pay | Attending: Internal Medicine | Admitting: Internal Medicine

## 2016-11-24 VITALS — BP 200/120 | HR 70 | Temp 98.5°F | Resp 16 | Wt 173.6 lb

## 2016-11-24 DIAGNOSIS — E785 Hyperlipidemia, unspecified: Secondary | ICD-10-CM | POA: Insufficient documentation

## 2016-11-24 DIAGNOSIS — I10 Essential (primary) hypertension: Secondary | ICD-10-CM | POA: Insufficient documentation

## 2016-11-24 DIAGNOSIS — Z7982 Long term (current) use of aspirin: Secondary | ICD-10-CM | POA: Insufficient documentation

## 2016-11-24 DIAGNOSIS — H9209 Otalgia, unspecified ear: Secondary | ICD-10-CM | POA: Insufficient documentation

## 2016-11-24 DIAGNOSIS — Z8719 Personal history of other diseases of the digestive system: Secondary | ICD-10-CM | POA: Insufficient documentation

## 2016-11-24 MED ORDER — CARVEDILOL 3.125 MG PO TABS: 3.1250 mg | ORAL_TABLET | Freq: Two times a day (BID) | ORAL | 3 refills | Status: DC

## 2016-11-24 MED ORDER — METOPROLOL TARTRATE 25 MG PO TABS: ORAL_TABLET | ORAL | 3 refills | Status: DC

## 2016-11-24 MED FILL — CARVEDILOL 3.125 MG TABLET: 3.125 | 30 days supply | Qty: 60 | Fill #0

## 2016-11-24 NOTE — Progress Notes (Signed)
Theresa [name], is a 71 y.o. female  LKG:[id number]  GUY:[id number]  DOB - [birth year]  Chief Complaint  Patient presents with  . Hypertension  . Ear Pain        Subjective:   Theresa [name] is a 71 y.o. female here today for a follow up visit for htn, last seen 11/03/16. She was unable to make RN appt for bp check due to transportation problems. Of note, she did not take her bp meds this am. She notes when she took the hydralazine she started having palpitations, she stopped it again after 4 days due to intolerance. She also notes occasional palpitations w/ her statins, bb and ace, but more tolerable.  She eats low salt for most part, but does eat canned sardines about 1x /wk.  Tries to avoid salt as much as able.   Patient has No headache, No chest pain, No abdominal pain - No Nausea, No new weakness tingling or numbness, No Cough - SOB.  No problems updated.  ALLERGIES: Allergies  Allergen Reactions  . Amlodipine Swelling  . Shellfish Allergy Swelling  . Trazodone And Nefazodone Swelling    Possible eye pain and swelling  . Hydralazine Hcl Palpitations  . Augmentin [Amoxicillin-Pot Clavulanate]   . Clonidine Derivatives Other (See Comments)    "heart came out of chest"  . Lisinopril-Hydrochlorothiazide Other (See Comments)    Possible flushing, insomnia    PAST MEDICAL HISTORY: Past Medical History:  Diagnosis Date  . Gastric ulcer   . Hypertension   . River blindness   . Sinusitis     MEDICATIONS AT HOME: Prior to Admission medications   Medication Sig Start Date End Date Taking? Authorizing Provider  acetaminophen (TYLENOL) 500 MG tablet Take 500 mg by mouth every 6 (six) hours as needed for mild pain or moderate pain.    Historical Provider, MD  aspirin 81 MG tablet Take 1 tablet (81 mg total) by mouth daily. Reported on 10/02/2015 06/09/16   Maren Reamer, MD  carvedilol (COREG) 3.125 MG tablet Take 1 tablet (3.125 mg total) by mouth 2 (two) times daily  with a meal. 11/24/16   Maren Reamer, MD  enalapril (VASOTEC) 10 MG tablet 10mg  po bid 10/01/16   Maren Reamer, MD  folic acid (FOLVITE) 563 MCG tablet Take 400 mcg by mouth daily.    Historical Provider, MD  ivermectin (STROMECTOL) 3 MG TABS tablet Take 150 mcg/kg by mouth once. Reported on 10/10/2015    Historical Provider, MD  Multiple Vitamin (MULTIVITAMIN) tablet Take 1 tablet by mouth daily.    Historical Provider, MD  pantoprazole (PROTONIX) 40 MG tablet Take 1 tablet (40 mg total) by mouth daily. 10/02/15   Maren Reamer, MD  Polyethyl Glycol-Propyl Glycol (EQ LUBRICANT EYE DROPS) 0.4-0.3 % SOLN Apply 11 drops to eye every 2 (two) hours as needed (for burning eyes). 11/03/16   Maren Reamer, MD  pravastatin (PRAVACHOL) 20 MG tablet Take 1 tablet (20 mg total) by mouth daily. 10/01/16   Maren Reamer, MD     Objective:   Vitals:   11/24/16 0846 11/24/16 0858  BP: (!) 212/104 (!) 200/120  Pulse: 70   Resp: 16   Temp: 98.5 F (36.9 C)   TempSrc: Oral   SpO2: 99%   Weight: 173 lb 9.6 oz (78.7 kg)     Exam General appearance : Awake, alert, not in any distress. Speech Clear. Not toxic looking, pleasant. HEENT: Atraumatic and Normocephalic, pupils equally  reactive to light. Neck: supple, no JVD.  Chest:Good air entry bilaterally, no added sounds. CVS: S1 S2 regular, no murmurs/gallups or rubs. Abdomen: Bowel sounds active, Non tender and not distended with no gaurding, rigidity or rebound. Extremities: B/L Lower Ext shows no edema, both legs are warm to touch Neurology: Awake alert, and oriented X 3, CN II-XII grossly intact, Non focal Skin:No Rash  Data Review Lab Results  Component Value Date   HGBA1C 5.4 10/02/2015    Depression screen [facility] 2/9 11/24/2016 11/03/2016 10/01/2016 06/09/2016 12/05/2015  Decreased Interest 0 0 0 0 0  Down, Depressed, Hopeless 0 0 0 0 1  PHQ - 2 Score 0 0 0 0 1  Altered sleeping - - - - 0  Tired, decreased energy - - - - 0  Change in  appetite - - - - 0  Feeling bad or failure about yourself  - - - - 0  Trouble concentrating - - - - 0  Moving slowly or fidgety/restless - - - - 0  Suicidal thoughts - - - - 0  PHQ-9 Score - - - - 1  Difficult doing work/chores - - - - Somewhat difficult      Assessment & Plan   1. HTN (hypertension), malignant - suspect  Multifactorial. Did not take her bp meds this am, also component of stress and white coat syndrome I suspect. - she took her lisinopril 20 and metoprolol 50 x 1 after we rechecked her bp. - dc hydralazine and added to allergy list (palpitations) - dw pt long term consequences of uncontrolled htn/hypertrophy, etc. - low salt diet encouraged - continue lisinopril 10 bid. - lengthy discussion re: bb, pt amendable to trying to switch from metoprolol to coreg 3.125 bid for now. - running out of rx options since numerous allergies, became severely dehydrated on hctz (w/ prinzide) in past as well. - severe bradycardia w/ higher bb, so watch cautiously. - rechk bp in 2 wks w/ RN Carilyn Goodpasture - if she is able to get transportation.  2. hld Continue statin as able  Cont asa 81 qd     Patient have been counseled extensively about nutrition and exercise  Return in about 4 weeks (around 12/22/2016) for malignant htn.  The patient was given clear instructions to go to ER or return to medical center if symptoms don't improve, worsen or new problems develop. The patient verbalized understanding. The patient was told to call to get lab results if they haven't heard anything in the next week.   This note has been created with Surveyor, quantity. Any transcriptional errors are unintentional.   Maren Reamer, MD, [facility] [place], [place]   11/24/2016, 9:15 AM

## 2016-11-24 NOTE — Patient Instructions (Addendum)
F/u Theresa Goodpasture RN 2 wk bp checks. -   Low-Sodium Eating Plan Sodium, which is an element that makes up salt, helps you maintain a healthy balance of fluids in your body. Too much sodium can increase your blood pressure and cause fluid and waste to be held in your body. Your health care provider or dietitian may recommend following this plan if you have high blood pressure (hypertension), kidney disease, liver disease, or heart failure. Eating less sodium can help lower your blood pressure, reduce swelling, and protect your heart, liver, and kidneys. What are tips for following this plan? General guidelines   Most people on this plan should limit their sodium intake to 1,500-2,000 mg (milligrams) of sodium each day. Reading food labels   The Nutrition Facts label lists the amount of sodium in one serving of the food. If you eat more than one serving, you must multiply the listed amount of sodium by the number of servings.  Choose foods with less than 140 mg of sodium per serving.  Avoid foods with 300 mg of sodium or more per serving. Shopping   Look for lower-sodium products, often labeled as "low-sodium" or "no salt added."  Always check the sodium content even if foods are labeled as "unsalted" or "no salt added".  Buy fresh foods.  Avoid canned foods and premade or frozen meals.  Avoid canned, cured, or processed meats  Buy breads that have less than 80 mg of sodium per slice. Cooking   Eat more home-cooked food and less restaurant, buffet, and fast food.  Avoid adding salt when cooking. Use salt-free seasonings or herbs instead of table salt or sea salt. Check with your health care provider or pharmacist before using salt substitutes.  Cook with plant-based oils, such as canola, sunflower, or olive oil. Meal planning   When eating at a restaurant, ask that your food be prepared with less salt or no salt, if possible.  Avoid foods that contain MSG (monosodium glutamate). MSG  is sometimes added to [place] food, bouillon, and some canned foods. What foods are recommended? The items listed may not be a complete list. Talk with your dietitian about what dietary choices are best for you. Grains  Low-sodium cereals, including oats, puffed wheat and rice, and shredded wheat. Low-sodium crackers. Unsalted rice. Unsalted pasta. Low-sodium bread. Whole-grain breads and whole-grain pasta. Vegetables  Fresh or frozen vegetables. "No salt added" canned vegetables. "No salt added" tomato sauce and paste. Low-sodium or reduced-sodium tomato and vegetable juice. Fruits  Fresh, frozen, or canned fruit. Fruit juice. Meats and other protein foods  Fresh or frozen (no salt added) meat, poultry, seafood, and fish. Low-sodium canned tuna and salmon. Unsalted nuts. Dried peas, beans, and lentils without added salt. Unsalted canned beans. Eggs. Unsalted nut butters. Dairy  Milk. Soy milk. Cheese that is naturally low in sodium, such as ricotta cheese, fresh mozzarella, or Swiss cheese Low-sodium or reduced-sodium cheese. Cream cheese. Yogurt. Fats and oils  Unsalted butter. Unsalted margarine with no trans fat. Vegetable oils such as canola or olive oils. Seasonings and other foods  Fresh and dried herbs and spices. Salt-free seasonings. Low-sodium mustard and ketchup. Sodium-free salad dressing. Sodium-free light mayonnaise. Fresh or refrigerated horseradish. Lemon juice. Vinegar. Homemade, reduced-sodium, or low-sodium soups. Unsalted popcorn and pretzels. Low-salt or salt-free chips. What foods are not recommended? The items listed may not be a complete list. Talk with your dietitian about what dietary choices are best for you. Grains  Instant hot cereals. Bread  stuffing, pancake, and biscuit mixes. Croutons. Seasoned rice or pasta mixes. Noodle soup cups. Boxed or frozen macaroni and cheese. Regular salted crackers. Self-rising flour. Vegetables  Sauerkraut, pickled vegetables, and  relishes. Olives. [place] fries. Onion rings. Regular canned vegetables (not low-sodium or reduced-sodium). Regular canned tomato sauce and paste (not low-sodium or reduced-sodium). Regular tomato and vegetable juice (not low-sodium or reduced-sodium). Frozen vegetables in sauces. Meats and other protein foods  Meat or fish that is salted, canned, smoked, spiced, or pickled. Bacon, ham, sausage, hotdogs, corned beef, chipped beef, packaged lunch meats, salt pork, jerky, pickled herring, anchovies, regular canned tuna, sardines, salted nuts. Dairy  Processed cheese and cheese spreads. Cheese curds. Blue cheese. Feta cheese. String cheese. Regular cottage cheese. Buttermilk. Canned milk. Fats and oils  Salted butter. Regular margarine. Ghee. Bacon fat. Seasonings and other foods  Onion salt, garlic salt, seasoned salt, table salt, and sea salt. Canned and packaged gravies. Worcestershire sauce. Tartar sauce. Barbecue sauce. Teriyaki sauce. Soy sauce, including reduced-sodium. Steak sauce. Fish sauce. Oyster sauce. Cocktail sauce. Horseradish that you find on the shelf. Regular ketchup and mustard. Meat flavorings and tenderizers. Bouillon cubes. Hot sauce and Tabasco sauce. Premade or packaged marinades. Premade or packaged taco seasonings. Relishes. Regular salad dressings. Salsa. Potato and tortilla chips. Corn chips and puffs. Salted popcorn and pretzels. Canned or dried soups. Pizza. Frozen entrees and pot pies. Summary  Eating less sodium can help lower your blood pressure, reduce swelling, and protect your heart, liver, and kidneys.  Most people on this plan should limit their sodium intake to 1,500-2,000 mg (milligrams) of sodium each day.  Canned, boxed, and frozen foods are high in sodium. Restaurant foods, fast foods, and pizza are also very high in sodium. You also get sodium by adding salt to food.  Try to cook at home, eat more fresh fruits and vegetables, and eat less fast food, canned,  processed, or prepared foods. This information is not intended to replace advice given to you by your health care provider. Make sure you discuss any questions you have with your health care provider. Document Released: 01/03/2002 Document Revised: 07/07/2016 Document Reviewed: 07/07/2016 Elsevier Interactive Patient Education  2017 [employer].

## 2016-12-11 ENCOUNTER — Encounter: Payer: Self-pay | Admitting: Internal Medicine

## 2016-12-11 ENCOUNTER — Other Ambulatory Visit: Payer: Self-pay | Admitting: *Deleted

## 2016-12-11 ENCOUNTER — Ambulatory Visit: Payer: Self-pay | Attending: Internal Medicine | Admitting: *Deleted

## 2016-12-11 VITALS — BP 226/126 | HR 76

## 2016-12-11 DIAGNOSIS — I10 Essential (primary) hypertension: Secondary | ICD-10-CM | POA: Insufficient documentation

## 2016-12-11 MED ORDER — CARVEDILOL 3.125 MG PO TABS
3.1250 mg | ORAL_TABLET | Freq: Two times a day (BID) | ORAL | 2 refills | Status: DC
Start: 2016-12-11 — End: 2016-12-23

## 2016-12-11 NOTE — Progress Notes (Signed)
Pt arrives alert and oriented and arrives in good spirits to [facility]. Last OV  11/24/2016 with Dr. Janne Napoleon.   Pt denies chest pain, SOB, HA, dizziness, or blurred visio. However she does state that after taking medication, she has palpitations within 10 minutes that can last up to three hours. Pt expresses that she feels Enalapril 20 mg is too much to go along with what she is taking now.  Verified medication. Pt states medication was taken this morning. She states she would prefer to take medication, Enalapril 10 mg daily.  Manual blood pressure reading: 240/100   She also has with her an automatic BP machine she uses at home.  Her BP machine was unable to get reading during the nurse visit due to "error" of BP running so high. She too brought in a list of BP  readings from home. BP readings on log from home range SBP 127- 209 and DBP 75-92. Pt states she has a lot of stress she is dealing with: passing of brothers, green card, eye exam, and being here to name a few she discloses.  Dr. Adrian Blackwater assisted with nurse visit due to elevated BP, pt has intolerance to Clonidine listed in chart. Pt refuses to take Clonidine 0.1mg .  PLAN Enalapril 10mg  daily Carvedilol BID ASA evening Will space out medication  She states she will call with BP readings today when she goes home.

## 2016-12-12 ENCOUNTER — Encounter: Payer: Self-pay | Admitting: Internal Medicine

## 2016-12-12 ENCOUNTER — Telehealth: Payer: Self-pay | Admitting: Internal Medicine

## 2016-12-12 NOTE — Telephone Encounter (Signed)
PT husband came to office requesting a refill for pravastatin (PRAVACHOL) 20 MG tablet    Please sent it to the [place] [facility],  Please follow up with PT

## 2016-12-15 ENCOUNTER — Encounter: Payer: Self-pay | Admitting: Internal Medicine

## 2016-12-16 ENCOUNTER — Telehealth: Payer: Self-pay | Admitting: Internal Medicine

## 2016-12-16 NOTE — Telephone Encounter (Signed)
Patient called the office asking to speak with PCP inform her that since she has been taking carvedilol (COREG) 3.125 MG tablet she has been feeling drowsy throughout the whole day. Pt is very concerned and cannot continue taking medication. Pt would like to be switched back to metoprolol. Please follow up.  Thank you.

## 2016-12-17 NOTE — Telephone Encounter (Signed)
Will forward to pcp

## 2016-12-18 ENCOUNTER — Ambulatory Visit: Payer: Self-pay | Attending: Internal Medicine

## 2016-12-23 MED ORDER — IVERMECTIN 3 MG PO TABS: 150.0000 ug/kg | ORAL_TABLET | Freq: Once | ORAL | 1 refills | Status: AC

## 2016-12-23 MED ORDER — METOPROLOL TARTRATE 50 MG PO TABS: 50.0000 mg | ORAL_TABLET | Freq: Two times a day (BID) | ORAL | 3 refills | Status: DC

## 2016-12-23 MED FILL — ?METOPROLOL 50 MG TABLET: 50 | 30 days supply | Qty: 60 | Fill #0

## 2016-12-23 NOTE — Telephone Encounter (Signed)
Called pt, confirmed dob. She has since stopped taking coreg due to s/es: made her very dizzy, drowsy, and c/o of palpitations. For last 2 days, she has gone back to her metoprolol, but taking 2 tabs 25mg  bid instead. Her recent home bps 140-160/90-95.  Suspect she has white coat syndrome  - added to problem list.  New rx: metoprolol 50mg  po bid.   Also, renewed her yearly rx of ivermetin. 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 She will pick up the paper rx and take to her outside pharmacy.

## 2017-01-15 ENCOUNTER — Ambulatory Visit: Payer: Self-pay | Attending: Family Medicine | Admitting: Family Medicine

## 2017-01-15 VITALS — BP 197/99 | HR 55 | Temp 98.3°F | Resp 18 | Ht 64.0 in | Wt 169.8 lb

## 2017-01-15 DIAGNOSIS — Z7982 Long term (current) use of aspirin: Secondary | ICD-10-CM | POA: Insufficient documentation

## 2017-01-15 DIAGNOSIS — Z9119 Patient's noncompliance with other medical treatment and regimen: Secondary | ICD-10-CM

## 2017-01-15 DIAGNOSIS — E785 Hyperlipidemia, unspecified: Secondary | ICD-10-CM | POA: Insufficient documentation

## 2017-01-15 DIAGNOSIS — Z91199 Patient's noncompliance with other medical treatment and regimen due to unspecified reason: Secondary | ICD-10-CM | POA: Insufficient documentation

## 2017-01-15 DIAGNOSIS — Z79899 Other long term (current) drug therapy: Secondary | ICD-10-CM | POA: Insufficient documentation

## 2017-01-15 DIAGNOSIS — I10 Essential (primary) hypertension: Secondary | ICD-10-CM | POA: Insufficient documentation

## 2017-01-15 MED ORDER — ENALAPRIL MALEATE 2.5 MG PO TABS
10.0000 mg | ORAL_TABLET | Freq: Every day | ORAL | Status: DC
  Administered 2017-01-15: 10 mg via ORAL

## 2017-01-15 MED ORDER — CHLOROTHIAZIDE 250 MG PO TABS: 250.0000 mg | ORAL_TABLET | ORAL | 2 refills | Status: DC

## 2017-01-15 MED ORDER — EZETIMIBE 10 MG PO TABS: 10.0000 mg | ORAL_TABLET | Freq: Every day | ORAL | 3 refills | Status: DC

## 2017-01-15 MED ORDER — METOPROLOL TARTRATE 12.5 MG HALF TABLET
50.0000 mg | ORAL_TABLET | Freq: Once | ORAL | Status: AC
  Administered 2017-01-15: 50 mg via ORAL

## 2017-01-15 MED ORDER — CHLOROTHIAZIDE 250 MG PO TABS: 250.0000 mg | ORAL_TABLET | Freq: Every day | ORAL | Status: DC

## 2017-01-15 MED ORDER — ENALAPRIL MALEATE 10 MG PO TABS: ORAL_TABLET | ORAL | 3 refills | Status: DC

## 2017-01-15 MED FILL — ENALAPRIL MALEATE 10 MG TAB: 10 | 30 days supply | Qty: 60 | Fill #0

## 2017-01-15 NOTE — Progress Notes (Signed)
Patient is here for HTN body pain & headaches

## 2017-01-15 NOTE — Progress Notes (Signed)
Subjective:  Patient ID: Theresa [name], female    DOB: [birth year]  Age: 71 y.o. MRN: [id number]  CC: Hypertension   HPI Theresa [name] presents for history of hypertension and hyperlipidemia.  She is not exercising and is not adherent to low salt diet.  Blood pressure is not well controlled at home. SBP range from 150-170's / DBP 70-80's. Cardiac symptoms none. Patient denies chest pain, chest pressure/discomfort, claudication, dyspnea, near-syncope, orthopnea, palpitations and syncope.  Cardiovascular risk factors: advanced age (older than 21 for men, 16 for women), dyslipidemia, hypertension and sedentary lifestyle. Use of agents associated with hypertension: none. History of target organ damage: none. She has a history of non-compliance with medication regimen. BP in office elevated.    Outpatient Medications Prior to Visit  Medication Sig Dispense Refill  . acetaminophen (TYLENOL) 500 MG tablet Take 500 mg by mouth every 6 (six) hours as needed for mild pain or moderate pain.    Marland Kitchen aspirin 81 MG tablet Take 1 tablet (81 mg total) by mouth daily. Reported on 10/02/2015 998 tablet 3  . folic acid (FOLVITE) 338 MCG tablet Take 400 mcg by mouth daily.    Marland Kitchen ivermectin (STROMECTOL) 3 MG TABS tablet Take 150 mcg/kg by mouth once. Reported on 10/10/2015    . metoprolol tartrate (LOPRESSOR) 50 MG tablet Take 1 tablet (50 mg total) by mouth 2 (two) times daily. 180 tablet 3  . Multiple Vitamin (MULTIVITAMIN) tablet Take 1 tablet by mouth daily.    . pantoprazole (PROTONIX) 40 MG tablet Take 1 tablet (40 mg total) by mouth daily. 30 tablet 3  . Polyethyl Glycol-Propyl Glycol (EQ LUBRICANT EYE DROPS) 0.4-0.3 % SOLN Apply 11 drops to eye every 2 (two) hours as needed (for burning eyes). 10 mL 11  . enalapril (VASOTEC) 10 MG tablet 10mg  po bid 180 tablet 3  . pravastatin (PRAVACHOL) 20 MG tablet Take 1 tablet (20 mg total) by mouth daily. 90 tablet 3   No facility-administered medications prior to visit.       ROS Review of Systems  Constitutional: Negative.   Eyes: Negative.   Respiratory: Negative.   Cardiovascular: Negative.   Gastrointestinal: Negative.   Musculoskeletal: Negative.   Neurological: Negative.     Objective:  BP (!) 197/99   Pulse (!) 55   Temp 98.3 F (36.8 C) (Oral)   Resp 18   Ht 5\' 4"  (1.626 m)   Wt 169 lb 12.8 oz (77 kg)   SpO2 98%   BMI 29.15 kg/m   BP/Weight 01/15/2017 12/11/2016 2/50/5397  Systolic BP 673 419 379  Diastolic BP 99 024 097  Wt. (Lbs) 169.8 - 173.6  BMI 29.15 - 29.8   Physical Exam  Constitutional: She is oriented to person, place, and time. She appears well-developed and well-nourished.  Eyes: Conjunctivae are normal. Pupils are equal, round, and reactive to light.  Neck: Normal range of motion. Neck supple. No JVD present.  Cardiovascular: Normal rate, regular rhythm, normal heart sounds and intact distal pulses.   Pulmonary/Chest: Effort normal and breath sounds normal.  Abdominal: Soft. Bowel sounds are normal.  Neurological: She is alert and oriented to person, place, and time.  Skin: Skin is warm and dry.  Psychiatric: She is agitated.  Nursing note and vitals reviewed.   Assessment & Plan:   Problem List Items Addressed This Visit      Other   Hyperlipidemia   Non-adherence to pravastatin.    Relevant Medications   ezetimibe (ZETIA) 10  MG tablet   Non-adherence to medical treatment    Other Visit Diagnoses    Hypertension, malignant    -  Primary   Contraindications to clonidine and hydralazine.    Pt. given dose of metoprolol 50 mg, enalapril 10 mg in office due to uncontrolled BP.   May resume scheduled dosages at home.   Adjustments made to BP medications for better BP control.    Follow up with PCP in 2 weeks.   Relevant Medications   enalapril (VASOTEC) 10 MG tablet   chlorothiazide (DIURIL) 250 MG tablet   metoprolol tartrate (LOPRESSOR) tablet 50 mg (Completed)   enalapril (VASOTEC) tablet 10 mg    ezetimibe (ZETIA) 10 MG tablet      Meds ordered this encounter  Medications  . enalapril (VASOTEC) 10 MG tablet    Sig: 10mg  po bid    Dispense:  180 tablet    Refill:  3    Must have office visit for refills    Order Specific Question:   Supervising Provider    Answer:   Tresa Garter [id number]  . chlorothiazide (DIURIL) 250 MG tablet    Sig: Take 1 tablet (250 mg total) by mouth every morning.    Dispense:  30 tablet    Refill:  2    Order Specific Question:   Supervising Provider    Answer:   Tresa Garter [id number]  . metoprolol tartrate (LOPRESSOR) tablet 50 mg  . enalapril (VASOTEC) tablet 10 mg  . DISCONTD: chlorothiazide (DIURIL) tablet 250 mg  . ezetimibe (ZETIA) 10 MG tablet    Sig: Take 1 tablet (10 mg total) by mouth daily.    Dispense:  90 tablet    Refill:  3    Order Specific Question:   Supervising Provider    Answer:   Tresa Garter [id number]    Follow-up: Return in about 2 weeks (around 01/29/2017) for HTN.   Theresa Spruce FNP

## 2017-01-15 NOTE — Patient Instructions (Addendum)
May resume dosage of metoprolol and enalapril tonight. Take medications consistently.  DASH Eating Plan DASH stands for "Dietary Approaches to Stop Hypertension." The DASH eating plan is a healthy eating plan that has been shown to reduce high blood pressure (hypertension). It may also reduce your risk for type 2 diabetes, heart disease, and stroke. The DASH eating plan may also help with weight loss. What are tips for following this plan? General guidelines  Avoid eating more than 2,300 mg (milligrams) of salt (sodium) a day. If you have hypertension, you may need to reduce your sodium intake to 1,500 mg a day.  Limit alcohol intake to no more than 1 drink a day for nonpregnant women and 2 drinks a day for men. One drink equals 12 oz of beer, 5 oz of wine, or 1 oz of hard liquor.  Work with your health care provider to maintain a healthy body weight or to lose weight. Ask what an ideal weight is for you.  Get at least 30 minutes of exercise that causes your heart to beat faster (aerobic exercise) most days of the week. Activities may include walking, swimming, or biking.  Work with your health care provider or diet and nutrition specialist (dietitian) to adjust your eating plan to your individual calorie needs. Reading food labels  Check food labels for the amount of sodium per serving. Choose foods with less than 5 percent of the Daily Value of sodium. Generally, foods with less than 300 mg of sodium per serving fit into this eating plan.  To find whole grains, look for the word "whole" as the first word in the ingredient list. Shopping  Buy products labeled as "low-sodium" or "no salt added."  Buy fresh foods. Avoid canned foods and premade or frozen meals. Cooking  Avoid adding salt when cooking. Use salt-free seasonings or herbs instead of table salt or sea salt. Check with your health care provider or pharmacist before using salt substitutes.  Do not fry foods. Cook foods using  healthy methods such as baking, boiling, grilling, and broiling instead.  Cook with heart-healthy oils, such as olive, canola, soybean, or sunflower oil. Meal planning   Eat a balanced diet that includes: ? 5 or more servings of fruits and vegetables each day. At each meal, try to fill half of your plate with fruits and vegetables. ? Up to 6-8 servings of whole grains each day. ? Less than 6 oz of lean meat, poultry, or fish each day. A 3-oz serving of meat is about the same size as a deck of cards. One egg equals 1 oz. ? 2 servings of low-fat dairy each day. ? A serving of nuts, seeds, or beans 5 times each week. ? Heart-healthy fats. Healthy fats called Omega-3 fatty acids are found in foods such as flaxseeds and coldwater fish, like sardines, salmon, and mackerel.  Limit how much you eat of the following: ? Canned or prepackaged foods. ? Food that is high in trans fat, such as fried foods. ? Food that is high in saturated fat, such as fatty meat. ? Sweets, desserts, sugary drinks, and other foods with added sugar. ? Full-fat dairy products.  Do not salt foods before eating.  Try to eat at least 2 vegetarian meals each week.  Eat more home-cooked food and less restaurant, buffet, and fast food.  When eating at a restaurant, ask that your food be prepared with less salt or no salt, if possible. What foods are recommended? The items listed  may not be a complete list. Talk with your dietitian about what dietary choices are best for you. Grains Whole-grain or whole-wheat bread. Whole-grain or whole-wheat pasta. Brown rice. Modena Morrow. Bulgur. Whole-grain and low-sodium cereals. Pita bread. Low-fat, low-sodium crackers. Whole-wheat flour tortillas. Vegetables Fresh or frozen vegetables (raw, steamed, roasted, or grilled). Low-sodium or reduced-sodium tomato and vegetable juice. Low-sodium or reduced-sodium tomato sauce and tomato paste. Low-sodium or reduced-sodium canned  vegetables. Fruits All fresh, dried, or frozen fruit. Canned fruit in natural juice (without added sugar). Meat and other protein foods Skinless chicken or [place]. Ground chicken or [place]. Pork with fat trimmed off. Fish and seafood. Egg whites. Dried beans, peas, or lentils. Unsalted nuts, nut butters, and seeds. Unsalted canned beans. Lean cuts of beef with fat trimmed off. Low-sodium, lean deli meat. Dairy Low-fat (1%) or fat-free (skim) milk. Fat-free, low-fat, or reduced-fat cheeses. Nonfat, low-sodium ricotta or cottage cheese. Low-fat or nonfat yogurt. Low-fat, low-sodium cheese. Fats and oils Soft margarine without trans fats. Vegetable oil. Low-fat, reduced-fat, or light mayonnaise and salad dressings (reduced-sodium). Canola, safflower, olive, soybean, and sunflower oils. Avocado. Seasoning and other foods Herbs. Spices. Seasoning mixes without salt. Unsalted popcorn and pretzels. Fat-free sweets. What foods are not recommended? The items listed may not be a complete list. Talk with your dietitian about what dietary choices are best for you. Grains Baked goods made with fat, such as croissants, muffins, or some breads. Dry pasta or rice meal packs. Vegetables Creamed or fried vegetables. Vegetables in a cheese sauce. Regular canned vegetables (not low-sodium or reduced-sodium). Regular canned tomato sauce and paste (not low-sodium or reduced-sodium). Regular tomato and vegetable juice (not low-sodium or reduced-sodium). Angie Fava. Olives. Fruits Canned fruit in a light or heavy syrup. Fried fruit. Fruit in cream or butter sauce. Meat and other protein foods Fatty cuts of meat. Ribs. Fried meat. Berniece Salines. Sausage. Bologna and other processed lunch meats. Salami. Fatback. Hotdogs. Bratwurst. Salted nuts and seeds. Canned beans with added salt. Canned or smoked fish. Whole eggs or egg yolks. Chicken or [place] with skin. Dairy Whole or 2% milk, cream, and half-and-half. Whole or full-fat  cream cheese. Whole-fat or sweetened yogurt. Full-fat cheese. Nondairy creamers. Whipped toppings. Processed cheese and cheese spreads. Fats and oils Butter. Stick margarine. Lard. Shortening. Ghee. Bacon fat. Tropical oils, such as coconut, palm kernel, or palm oil. Seasoning and other foods Salted popcorn and pretzels. Onion salt, garlic salt, seasoned salt, table salt, and sea salt. Worcestershire sauce. Tartar sauce. Barbecue sauce. Teriyaki sauce. Soy sauce, including reduced-sodium. Steak sauce. Canned and packaged gravies. Fish sauce. Oyster sauce. Cocktail sauce. Horseradish that you find on the shelf. Ketchup. Mustard. Meat flavorings and tenderizers. Bouillon cubes. Hot sauce and Tabasco sauce. Premade or packaged marinades. Premade or packaged taco seasonings. Relishes. Regular salad dressings. Where to find more information:  National Heart, Lung, and [place]: https://wilson-eaton.com/  American Heart Association: www.heart.org Summary  The DASH eating plan is a healthy eating plan that has been shown to reduce high blood pressure (hypertension). It may also reduce your risk for type 2 diabetes, heart disease, and stroke.  With the DASH eating plan, you should limit salt (sodium) intake to 2,300 mg a day. If you have hypertension, you may need to reduce your sodium intake to 1,500 mg a day.  When on the DASH eating plan, aim to eat more fresh fruits and vegetables, whole grains, lean proteins, low-fat dairy, and heart-healthy fats.  Work with your health care provider or diet  and nutrition specialist (dietitian) to adjust your eating plan to your individual calorie needs. This information is not intended to replace advice given to you by your health care provider. Make sure you discuss any questions you have with your health care provider. Document Released: 07/03/2011 Document Revised: 07/07/2016 Document Reviewed: 07/07/2016 Elsevier Interactive Patient Education  2017 [employer].

## 2017-01-16 ENCOUNTER — Telehealth: Payer: Self-pay

## 2017-01-16 ENCOUNTER — Other Ambulatory Visit: Payer: Self-pay | Admitting: Family Medicine

## 2017-01-16 DIAGNOSIS — I10 Essential (primary) hypertension: Secondary | ICD-10-CM

## 2017-01-16 MED ORDER — CHLORTHALIDONE 25 MG PO TABS: 12.5000 mg | ORAL_TABLET | Freq: Every day | ORAL | 2 refills | Status: DC

## 2017-01-16 MED FILL — ?EZETIMIBE 10 MG TAB: 10 | 30 days supply | Qty: 30 | Fill #0

## 2017-01-16 MED FILL — ?CHLORTHALIDONE 25 MG TABLE: 25 | 30 days supply | Qty: 15 | Fill #0

## 2017-01-16 MED FILL — ?METOPROLOL 50 MG TABLET: 50 | 30 days supply | Qty: 60 | Fill #1

## 2017-01-16 NOTE — Telephone Encounter (Signed)
Contacted pt to make aware that the rx [place] would like her to start the pharmacy does not have it and we wanted to know what pharmacy would she like [place] to send it to per patient she wants the rx to stay at our pharmacy and she will wait.provider is aware

## 2017-01-26 ENCOUNTER — Ambulatory Visit: Payer: Self-pay | Admitting: Family Medicine

## 2017-02-12 ENCOUNTER — Ambulatory Visit: Payer: Self-pay | Admitting: Family Medicine

## 2017-02-17 MED FILL — ENALAPRIL MALEATE 10 MG TAB: 10 | 30 days supply | Qty: 60 | Fill #1

## 2017-02-17 MED FILL — ?METOPROLOL 50 MG TABLET: 50 | 30 days supply | Qty: 60 | Fill #2

## 2017-02-18 MED FILL — LUMIGAN 0.01% EYE DROPS: 0.01 | 25 days supply | Qty: 3 | Fill #1

## 2017-03-17 MED FILL — ENALAPRIL MALEATE 10 MG TAB: 10 | 90 days supply | Qty: 180 | Fill #2

## 2017-03-17 MED FILL — ?METOPROLOL 50 MG TABLET: 50 | 30 days supply | Qty: 60 | Fill #3

## 2017-04-22 MED FILL — ?METOPROLOL 50 MG TABLET: 50 | 30 days supply | Qty: 60 | Fill #4

## 2017-05-25 MED FILL — LUMIGAN 0.01% EYE DROPS: 0.01 | 25 days supply | Qty: 3 | Fill #2

## 2017-05-25 MED FILL — ?METOPROLOL 50 MG TABLET: 50 | 30 days supply | Qty: 60 | Fill #5

## 2017-06-01 MED FILL — ENALAPRIL MALEATE 10 MG TAB: 10 | 90 days supply | Qty: 180 | Fill #3

## 2017-07-27 MED FILL — ?METOPROLOL 50 MG TABLET: 50 | 30 days supply | Qty: 60 | Fill #6

## 2017-07-30 MED FILL — LUMIGAN 0.01% EYE DROPS: 0.01 | 25 days supply | Qty: 3 | Fill #3

## 2017-08-28 MED FILL — ENALAPRIL MALEATE 10 MG TAB: 10 | 90 days supply | Qty: 180 | Fill #4

## 2017-09-01 MED FILL — ?METOPROLOL TARTRTE 50MG TA: 50 | 30 days supply | Qty: 60 | Fill #7

## 2017-09-15 ENCOUNTER — Ambulatory Visit: Payer: Self-pay | Attending: Internal Medicine

## 2017-10-05 MED FILL — METOPROLOL TARTRATE 50 MG T: 50 | 30 days supply | Qty: 60 | Fill #8

## 2017-11-02 MED FILL — METOPROLOL TARTRATE 50 MG T: 50 | 30 days supply | Qty: 60 | Fill #9

## 2017-11-02 MED FILL — LUMIGAN 0.01% EYE DROPS: 0.01 | 18 days supply | Qty: 8 | Fill #0

## 2017-11-30 MED FILL — LUMIGAN 0.01% EYE DROPS: 0.01 | 18 days supply | Qty: 3 | Fill #1

## 2017-11-30 MED FILL — ?METOPROLOL 50 MG TABLET: 50 | 30 days supply | Qty: 60 | Fill #10

## 2017-11-30 MED FILL — ENALAPRIL MALEATE 10 MG TAB: 10 | 30 days supply | Qty: 60 | Fill #5

## 2017-12-31 ENCOUNTER — Ambulatory Visit: Payer: Self-pay

## 2017-12-31 ENCOUNTER — Other Ambulatory Visit: Payer: Self-pay | Admitting: *Deleted

## 2017-12-31 ENCOUNTER — Ambulatory Visit: Payer: Self-pay | Attending: Family Medicine | Admitting: Physician Assistant

## 2017-12-31 VITALS — BP 198/84 | HR 60 | Temp 98.6°F | Resp 18 | Ht 64.0 in | Wt 172.0 lb

## 2017-12-31 DIAGNOSIS — Z8711 Personal history of peptic ulcer disease: Secondary | ICD-10-CM | POA: Insufficient documentation

## 2017-12-31 DIAGNOSIS — Z91013 Allergy to seafood: Secondary | ICD-10-CM | POA: Insufficient documentation

## 2017-12-31 DIAGNOSIS — Z79899 Other long term (current) drug therapy: Secondary | ICD-10-CM | POA: Insufficient documentation

## 2017-12-31 DIAGNOSIS — Z7982 Long term (current) use of aspirin: Secondary | ICD-10-CM | POA: Insufficient documentation

## 2017-12-31 DIAGNOSIS — Z880 Allergy status to penicillin: Secondary | ICD-10-CM | POA: Insufficient documentation

## 2017-12-31 DIAGNOSIS — Z888 Allergy status to other drugs, medicaments and biological substances status: Secondary | ICD-10-CM | POA: Insufficient documentation

## 2017-12-31 DIAGNOSIS — I10 Essential (primary) hypertension: Secondary | ICD-10-CM | POA: Insufficient documentation

## 2017-12-31 DIAGNOSIS — Z9119 Patient's noncompliance with other medical treatment and regimen: Secondary | ICD-10-CM | POA: Insufficient documentation

## 2017-12-31 DIAGNOSIS — E785 Hyperlipidemia, unspecified: Secondary | ICD-10-CM | POA: Insufficient documentation

## 2017-12-31 DIAGNOSIS — Z91199 Patient's noncompliance with other medical treatment and regimen due to unspecified reason: Secondary | ICD-10-CM

## 2017-12-31 MED ORDER — ENALAPRIL MALEATE 10 MG PO TABS: ORAL_TABLET | ORAL | 3 refills | Status: DC

## 2017-12-31 MED ORDER — CHLORTHALIDONE 25 MG PO TABS: 25.0000 mg | ORAL_TABLET | Freq: Every day | ORAL | 2 refills | Status: DC

## 2017-12-31 MED ORDER — METOPROLOL TARTRATE 25 MG PO TABS: 25.0000 mg | ORAL_TABLET | Freq: Two times a day (BID) | ORAL | 3 refills | Status: DC

## 2017-12-31 MED ORDER — EZETIMIBE 10 MG PO TABS: 10.0000 mg | ORAL_TABLET | Freq: Every day | ORAL | 3 refills | Status: DC

## 2017-12-31 MED ORDER — CHLOROTHIAZIDE 250 MG PO TABS: 250.0000 mg | ORAL_TABLET | ORAL | 2 refills | Status: DC

## 2017-12-31 MED ORDER — ASPIRIN 81 MG PO TABS: 81.0000 mg | ORAL_TABLET | Freq: Every day | ORAL | 3 refills | Status: DC

## 2017-12-31 MED FILL — ENALAPRIL MALEATE 10 MG TAB: 10 | 90 days supply | Qty: 180 | Fill #0

## 2017-12-31 MED FILL — METOPROLOL TARTRATE 25 MG T: 25 | 30 days supply | Qty: 60 | Fill #0

## 2017-12-31 NOTE — Patient Instructions (Addendum)
Check blood pressure and pulse daily and record and f/up in 3-4 weeks

## 2017-12-31 NOTE — Progress Notes (Signed)
Theresa [name], is a 72 y.o. female  VVO:[id number]  YIR:[id number]  DOB - 06/12/1946  Subjective:  Chief Complaint and HPI: Theresa [name] is a 71 y.o. female here today for med RF.  Pulse has been going intot the 40s but she has been feeling fine/asymptomatic.  ROS:   Constitutional:  No f/c, No night sweats, No unexplained weight loss. EENT:  No vision changes, No blurry vision, No hearing changes. No mouth, throat, or ear problems.  Respiratory: No cough, No SOB Cardiac: No CP, no palpitations GI:  No abd pain, No N/V/D. GU: No Urinary s/sx Musculoskeletal: No joint pain Neuro: No headache, no dizziness, no motor weakness.  Skin: No rash Endocrine:  No polydipsia. No polyuria.  Psych: Denies SI/HI  No problems updated.  ALLERGIES: Allergies  Allergen Reactions  . Amlodipine Swelling  . Coreg [Carvedilol] Other (See Comments)    Drowsiness, dizziness, palpitations  . Shellfish Allergy Swelling  . Trazodone And Nefazodone Swelling    Possible eye pain and swelling  . Hydralazine Hcl Palpitations  . Augmentin [Amoxicillin-Pot Clavulanate]   . Clonidine Derivatives Other (See Comments)    "heart came out of chest"  . Lisinopril-Hydrochlorothiazide Other (See Comments)    Possible flushing, insomnia    PAST MEDICAL HISTORY: Past Medical History:  Diagnosis Date  . Gastric ulcer   . Hypertension   . River blindness   . Sinusitis     MEDICATIONS AT HOME: Prior to Admission medications   Medication Sig Start Date End Date Taking? Authorizing Provider  acetaminophen (TYLENOL) 500 MG tablet Take 500 mg by mouth every 6 (six) hours as needed for mild pain or moderate pain.    [provider]  aspirin 81 MG tablet Take 1 tablet (81 mg total) by mouth daily. Reported on 10/02/2015 12/31/17   Argentina Donovan, PA-C  chlorothiazide (DIURIL) 250 MG tablet Take 1 tablet (250 mg total) by mouth every morning. 12/31/17   Argentina Donovan, PA-C  chlorthalidone  (HYGROTON) 25 MG tablet Take 1 tablet (25 mg total) by mouth daily. 12/31/17   Argentina Donovan, PA-C  enalapril (VASOTEC) 10 MG tablet 10mg  po bid 01/15/17   Alfonse Spruce, FNP  ezetimibe (ZETIA) 10 MG tablet Take 1 tablet (10 mg total) by mouth daily. 12/31/17   Argentina Donovan, PA-C  folic acid (FOLVITE) 500 MCG tablet Take 400 mcg by mouth daily.    [provider]  ivermectin (STROMECTOL) 3 MG TABS tablet Take 150 mcg/kg by mouth once. Reported on 10/10/2015    [provider]  metoprolol tartrate (LOPRESSOR) 25 MG tablet Take 1 tablet (25 mg total) by mouth 2 (two) times daily. 12/31/17   Argentina Donovan, PA-C  Multiple Vitamin (MULTIVITAMIN) tablet Take 1 tablet by mouth daily.    [provider]  pantoprazole (PROTONIX) 40 MG tablet Take 1 tablet (40 mg total) by mouth daily. 10/02/15   Maren Reamer, MD  Polyethyl Glycol-Propyl Glycol (EQ LUBRICANT EYE DROPS) 0.4-0.3 % SOLN Apply 11 drops to eye every 2 (two) hours as needed (for burning eyes). 11/03/16   Maren Reamer, MD     Objective:  EXAM:   Vitals:   12/31/17 1124  BP: (!) 198/84  Pulse: 60  Resp: 18  Temp: 98.6 F (37 C)  TempSrc: Oral  SpO2: 100%  Weight: 172 lb (78 kg)  Height: 5\' 4"  (1.626 m)    General appearance : A&OX3. NAD. Non-toxic-appearing HEENT: Atraumatic and Normocephalic.  PERRLA. EOM intact.   Neck: supple, no JVD. No cervical lymphadenopathy. No thyromegaly Chest/Lungs:  Breathing-non-labored, Good air entry bilaterally, breath sounds normal without rales, rhonchi, or wheezing  CVS: S1 S2 regular, no murmurs, gallops, rubs  Extremities: Bilateral Lower Ext shows no edema, both legs are warm to touch with = pulse throughout Neurology:  CN II-XII grossly intact, Non focal.   Psych:  TP linear. J/I WNL. Normal speech. Appropriate eye contact and affect.  Skin:  No Rash  Data Review Lab Results  Component Value Date   HGBA1C 5.4 10/02/2015     Assessment &  Plan   1. Essential hypertension Decrease dose of metoprolol due to bradycardia around 45 at home.  Check blood pressure and pulse daily and record and f/up in 3-4 weeks - Comprehensive metabolic panel Decrease dose due to bradycardia at home- metoprolol tartrate (LOPRESSOR) 25 MG tablet; Take 1 tablet (25 mg total) by mouth 2 (two) times daily.  Dispense: 180 tablet; Refill: 3 Increase dose- chlorthalidone (HYGROTON) 25 MG tablet; Take 1 tablet (25 mg total) by mouth daily.  Dispense: 30 tablet; Refill: 2 - chlorothiazide (DIURIL) 250 MG tablet; Take 1 tablet (250 mg total) by mouth every morning.  Dispense: 30 tablet; Refill: 2  2. Hyperlipidemia, unspecified hyperlipidemia type - Lipid Panel - ezetimibe (ZETIA) 10 MG tablet; Take 1 tablet (10 mg total) by mouth daily.  Dispense: 90 tablet; Refill: 3  3. Non-adherence to medical treatment Encouraged compliance with appointments and bloodwork(patient tried to leave prior to bloodwork today).  She doesn't feel as though she should have to have office visits more than once a year.     Patient have been counseled extensively about nutrition and exercise  Return in about 1 month (around 01/30/2018) for assign new PCP.  The patient was given clear instructions to go to ER or return to medical center if symptoms don't improve, worsen or new problems develop. The patient verbalized understanding. The patient was told to call to get lab results if they haven't heard anything in the next week.     Freeman Caldron, PA-C [facility], [place]   12/31/2017, 11:32 AMPatient ID: Theresa [name], female   DOB: April 02, 1946, 72 y.o.   MRN: [id number]

## 2017-12-31 NOTE — Telephone Encounter (Signed)
Refilled per [facility]

## 2018-01-01 ENCOUNTER — Telehealth: Payer: Self-pay | Admitting: *Deleted

## 2018-01-01 LAB — COMPREHENSIVE METABOLIC PANEL
ALBUMIN: 4.3 g/dL (ref 3.5–4.8)
ALT: 11 IU/L (ref 0–32)
AST: 18 IU/L (ref 0–40)
Albumin/Globulin Ratio: 1.4 (ref 1.2–2.2)
Alkaline Phosphatase: 62 IU/L (ref 39–117)
BILIRUBIN TOTAL: 0.4 mg/dL (ref 0.0–1.2)
BUN / CREAT RATIO: 13 (ref 12–28)
BUN: 13 mg/dL (ref 8–27)
CALCIUM: 9.6 mg/dL (ref 8.7–10.3)
CHLORIDE: 105 mmol/L (ref 96–106)
CO2: 26 mmol/L (ref 20–29)
Creatinine, Ser: 1.04 mg/dL — ABNORMAL HIGH (ref 0.57–1.00)
GFR, EST AFRICAN AMERICAN: 62 mL/min/{1.73_m2} (ref >59)
GFR, EST NON AFRICAN AMERICAN: 53 mL/min/{1.73_m2} — AB (ref >59)
GLUCOSE: 102 mg/dL — AB (ref 65–99)
Globulin, Total: 3.1 g/dL (ref 1.5–4.5)
Potassium: 4.1 mmol/L (ref 3.5–5.2)
Sodium: 143 mmol/L (ref 134–144)
TOTAL PROTEIN: 7.4 g/dL (ref 6.0–8.5)

## 2018-01-01 LAB — LIPID PANEL
CHOL/HDL RATIO: 2.9 ratio (ref 0.0–4.4)
Cholesterol, Total: 243 mg/dL — ABNORMAL HIGH (ref 100–199)
HDL: 84 mg/dL (ref >39)
LDL CALC: 146 mg/dL — AB (ref 0–99)
Triglycerides: 64 mg/dL (ref 0–149)
VLDL CHOLESTEROL CAL: 13 mg/dL (ref 5–40)

## 2018-01-01 NOTE — Telephone Encounter (Signed)
MA unable to reach patient due to ringing and disconnecting. !!!Please inform patient of kidneys being mildly impaired and needing to control her BP and keep FU appointments. Patients cholesterol is also high and patient should take 3 grams of fish oil daily!!!

## 2018-01-01 NOTE — Telephone Encounter (Signed)
-----   Message from Argentina Donovan, [place] sent at 01/01/2018  2:08 PM EDT ----- Please call patient.  Her kidneys look mildly impaired so it is important to get BP under control and keep f/up appts.  Her cholesterol is high and she should eat a heart healthy diet and can start 3 grams fish oil daily too. Thanks, Freeman Caldron, PA-C

## 2018-01-07 ENCOUNTER — Ambulatory Visit: Payer: Self-pay

## 2018-01-07 ENCOUNTER — Ambulatory Visit: Payer: Self-pay | Attending: Family Medicine

## 2018-01-25 ENCOUNTER — Telehealth: Payer: Self-pay | Admitting: Family Medicine

## 2018-01-25 NOTE — Telephone Encounter (Signed)
Patient called requesting to speak with Clifton James regarding her CAFA. Please f/u

## 2018-02-02 NOTE — Telephone Encounter (Signed)
I sent an email to the Financial dept. To rerect this mistake

## 2018-02-25 ENCOUNTER — Telehealth: Payer: Self-pay | Admitting: Internal Medicine

## 2018-02-25 ENCOUNTER — Ambulatory Visit: Payer: Self-pay | Attending: Internal Medicine | Admitting: Internal Medicine

## 2018-02-25 VITALS — BP 213/102 | HR 66 | Temp 98.9°F | Resp 16 | Ht 64.0 in | Wt 173.6 lb

## 2018-02-25 DIAGNOSIS — Z5321 Procedure and treatment not carried out due to patient leaving prior to being seen by health care provider: Secondary | ICD-10-CM

## 2018-02-25 MED FILL — METOPROLOL TARTRATE 25 MG T: 25 | 90 days supply | Qty: 180 | Fill #1

## 2018-02-25 NOTE — Telephone Encounter (Signed)
Will forward to pcp

## 2018-02-25 NOTE — Telephone Encounter (Signed)
Patient called stating that she was not able to stay for her appointment today and is out of medication. Patient states she spoke with the nurse regarding her situation and would like to speak with her. Please f/u with pt.

## 2018-02-26 NOTE — Progress Notes (Signed)
Pt left without being seen.

## 2018-03-09 MED FILL — CLINDAMYCIN HCL 300 MG CAP: 300 | 10 days supply | Qty: 30 | Fill #0

## 2018-03-15 ENCOUNTER — Encounter: Payer: Self-pay | Admitting: Family Medicine

## 2018-03-15 ENCOUNTER — Ambulatory Visit: Payer: Self-pay | Attending: Family Medicine | Admitting: Family Medicine

## 2018-03-15 VITALS — BP 199/98 | HR 60 | Temp 98.2°F | Resp 18 | Ht 65.0 in | Wt 173.0 lb

## 2018-03-15 DIAGNOSIS — Z7982 Long term (current) use of aspirin: Secondary | ICD-10-CM | POA: Insufficient documentation

## 2018-03-15 DIAGNOSIS — I10 Essential (primary) hypertension: Secondary | ICD-10-CM

## 2018-03-15 DIAGNOSIS — Z888 Allergy status to other drugs, medicaments and biological substances status: Secondary | ICD-10-CM | POA: Insufficient documentation

## 2018-03-15 DIAGNOSIS — Z880 Allergy status to penicillin: Secondary | ICD-10-CM | POA: Insufficient documentation

## 2018-03-15 DIAGNOSIS — Z79899 Other long term (current) drug therapy: Secondary | ICD-10-CM

## 2018-03-15 MED ORDER — VALSARTAN 160 MG PO TABS: 160.0000 mg | ORAL_TABLET | Freq: Every day | ORAL | 6 refills | Status: DC

## 2018-03-15 MED FILL — VALSARTAN 160 MG TABLET: 160 | 30 days supply | Qty: 30 | Fill #0

## 2018-03-15 NOTE — Progress Notes (Signed)
Subjective:    Patient ID: Theresa [name], female    DOB: August 27, 1945, 72 y.o.   MRN: [id number]  HPI  72 yo female who is seen in follow-up of Hypertension, she was last seen here in the clinic on 12/31/17 and was to follow-up in 1 month. Patient did come to the office on 02/25/18 but left before being seen.  Patient reports that she believes her blood pressure is elevated at today's visit as she recently became upset with her granddaughter after discovering that her granddaughter had never turned in the patient's application to use the bus service.  Patient states that she is taking her blood pressure medication daily.  Patient thinks that at home her blood pressure is likely normal.  Patient also states that she has a current toothache which she also believes may be affecting her blood pressure.  Patient states she was prescribed clindamycin and has a follow-up appointment with the dentist.  Patient initially stated she was taking the clindamycin but also later stated that she was afraid to take the clindamycin.  Patient also with complaint of noticing small veins on her legs and she thinks that these are secondary to her use of aspirin and patient states that she stopped her daily baby aspirin about 3 days ago. (On review of patient's past records, she does have a history of noncompliance with medications as well as history of stating that multiple medications did not agree with her/cause problems and therefore patient often discontinues medications without obtaining medical advice on changing her medications.)  Patient reports that she is also tired because she has had poor sleep related to her toothache and this also may be causing her elevated blood pressure.  Patient denies any chest pain or palpitations, no shortness of breath or cough, no peripheral edema.  Patient denies any abdominal pain-no nausea or vomiting.  Patient states that overall she feels fairly well but is worried and anxious because of the  paperwork situation with her granddaughter and patient will have to reapply.  Past Medical History:  Diagnosis Date  . Gastric ulcer   . Hypertension   . River blindness   . Sinusitis    Social History   Tobacco Use  . Smoking status: Never Smoker  Substance Use Topics  . Alcohol use: No  . Drug use: No   Past Surgical History:  Procedure Laterality Date  . APPENDECTOMY    . BREAST LUMPECTOMY     Family History  Problem Relation Age of Onset  . Heart disease Mother    Allergies  Allergen Reactions  . Amlodipine Swelling  . Coreg [Carvedilol] Other (See Comments)    Drowsiness, dizziness, palpitations  . Shellfish Allergy Swelling  . Trazodone And Nefazodone Swelling    Possible eye pain and swelling  . Hydralazine Hcl Palpitations  . Augmentin [Amoxicillin-Pot Clavulanate]   . Clonidine Derivatives Other (See Comments)    "heart came out of chest"  . Lisinopril-Hydrochlorothiazide Other (See Comments)    Possible flushing, insomnia     Review of Systems  Constitutional: Positive for fatigue (poor sleep due to current toothache). Negative for chills and fever.  HENT: Positive for dental problem (on clindamycin and is supposed to have dental procedure next week) and ear pain (left ear pain which she thinks is coming from her bad tooth).   Respiratory: Negative for cough and shortness of breath.   Cardiovascular: Negative for chest pain, palpitations and leg swelling.  Gastrointestinal: Negative for abdominal pain and  nausea.  Genitourinary: Negative for dysuria and frequency.  Skin:       Complaint of ruptured vessels on her legs (has spider veins on exam)       Objective:   Physical Exam  Constitutional: She is oriented to person, place, and time. She appears well-developed and well-nourished. No distress.  HENT:  Right Ear: Tympanic membrane, external ear and ear canal normal.  Left Ear: Tympanic membrane, external ear and ear canal normal.  Nose: Mucosal  edema present.  Mouth/Throat: Oropharynx is clear and moist and mucous membranes are normal.    Cardiovascular: Normal rate and regular rhythm.  Pulmonary/Chest: Effort normal. No respiratory distress.  Abdominal: Soft. There is no tenderness.  Musculoskeletal: She exhibits no edema or tenderness.  Neurological: She is alert and oriented to person, place, and time. No cranial nerve deficit.  Skin:  Presence of spider veins on the upper thighs bilaterally right greater than left and proximal to the anteriomedial knees   Psychiatric: She has a normal mood and affect.  Vitals reviewed. BP (!) 199/98 (BP Location: Left Arm, Patient Position: Sitting, Cuff Size: Large)   Pulse 60   Temp 98.2 F (36.8 C) (Oral)   Resp 18   Ht 5\' 5"  (1.651 m)   Wt 173 lb (78.5 kg)   SpO2 100%   BMI 28.79 kg/m      Assessment & Plan:  1. Hypertension, uncontrolled Patient's enalapril will be discontinued and she is to start the use of valsartan 160 mg once per day and continue her metoprolol. It appears from review of medications with patient and by a handwritten note from patient that she has only been taking the enalapril and metoprolol for her blood pressure. Patient reports that she stopped the use of aspirin 3 days ago due to the spider veins on her legs. Patient is also taking a multivitamin. Patient states that she is on clindamycin for a tooth infection but later stated that she was not taking it due to a fear of side effects but later said that was taking the antibiotic and ibuprofen for pain. Patient thinks that her tooth pain and getting upset with her granddaughter have caused her blood pressure to be up today but on chart review her blood pressure is very elevated at most visits.       Patient was asked to return in 2 weeks to meet with the clinical pharmacist regarding the change in her medication as well as for blood pressure recheck and lab order placed for 2 week BMP as her last BMP in June has a  mild increase in Cr. Discussed concern regarding patient's increased risk of stroke due to uncontrolled blood pressure. Patient information provided regarding hypertension and DASH  Diet provided.  - Basic Metabolic Panel; Future  2. Encounter for long-term (current) use of medications Will obtain BMP at follow-up due to change in medications and prior increase in Cr at 1.04 in June. - Basic Metabolic Panel; Future  Allergies as of 03/15/2018      Reactions   Amlodipine Swelling   Coreg [carvedilol] Other (See Comments)   Drowsiness, dizziness, palpitations   Shellfish Allergy Swelling   Trazodone And Nefazodone Swelling   Possible eye pain and swelling   Hydralazine Hcl Palpitations   Augmentin [amoxicillin-pot Clavulanate]    Clonidine Derivatives Other (See Comments)   "heart came out of chest"   Lisinopril-hydrochlorothiazide Other (See Comments)   Possible flushing, insomnia      Medication List  Accurate as of 03/15/18 11:59 PM. Always use your most recent med list.          acetaminophen 500 MG tablet Commonly known as:  TYLENOL Take 500 mg by mouth every 6 (six) hours as needed for mild pain or moderate pain.   aspirin 81 MG tablet Take 1 tablet (81 mg total) by mouth daily. Reported on 10/02/2015   chlorothiazide 250 MG tablet Commonly known as:  DIURIL Take 1 tablet (250 mg total) by mouth every morning.   chlorthalidone 25 MG tablet Commonly known as:  HYGROTON Take 1 tablet (25 mg total) by mouth daily.   ezetimibe 10 MG tablet Commonly known as:  ZETIA Take 1 tablet (10 mg total) by mouth daily.   folic acid 855 MCG tablet Commonly known as:  FOLVITE Take 400 mcg by mouth daily.   ivermectin 3 MG Tabs tablet Commonly known as:  STROMECTOL Take 150 mcg/kg by mouth once. Reported on 10/10/2015   metoprolol tartrate 25 MG tablet Commonly known as:  LOPRESSOR Take 1 tablet (25 mg total) by mouth 2 (two) times daily.   multivitamin tablet Take 1  tablet by mouth daily.   pantoprazole 40 MG tablet Commonly known as:  PROTONIX Take 1 tablet (40 mg total) by mouth daily.   Polyethyl Glycol-Propyl Glycol 0.4-0.3 % Soln Apply 11 drops to eye every 2 (two) hours as needed (for burning eyes).   valsartan 160 MG tablet Commonly known as:  DIOVAN Take 1 tablet (160 mg total) by mouth daily. To lower blood pressure.      Return in about 4 weeks (around 04/12/2018) for BP recheck with [facility] and lab; 4 weeks with PCP.

## 2018-03-15 NOTE — Patient Instructions (Signed)
Hypertension Hypertension is another name for high blood pressure. High blood pressure forces your heart to work harder to pump blood. This can cause problems over time. There are two numbers in a blood pressure reading. There is a top number (systolic) over a bottom number (diastolic). It is best to have a blood pressure below 120/80. Healthy choices can help lower your blood pressure. You may need medicine to help lower your blood pressure if:  Your blood pressure cannot be lowered with healthy choices.  Your blood pressure is higher than 130/80.  Follow these instructions at home: Eating and drinking  If directed, follow the DASH eating plan. This diet includes: ? Filling half of your plate at each meal with fruits and vegetables. ? Filling one quarter of your plate at each meal with whole grains. Whole grains include whole wheat pasta, brown rice, and whole grain bread. ? Eating or drinking low-fat dairy products, such as skim milk or low-fat yogurt. ? Filling one quarter of your plate at each meal with low-fat (lean) proteins. Low-fat proteins include fish, skinless chicken, eggs, beans, and tofu. ? Avoiding fatty meat, cured and processed meat, or chicken with skin. ? Avoiding premade or processed food.  Eat less than 1,500 mg of salt (sodium) a day.  Limit alcohol use to no more than 1 drink a day for nonpregnant women and 2 drinks a day for men. One drink equals 12 oz of beer, 5 oz of wine, or 1 oz of hard liquor. Lifestyle  Work with your doctor to stay at a healthy weight or to lose weight. Ask your doctor what the best weight is for you.  Get at least 30 minutes of exercise that causes your heart to beat faster (aerobic exercise) most days of the week. This may include walking, swimming, or biking.  Get at least 30 minutes of exercise that strengthens your muscles (resistance exercise) at least 3 days a week. This may include lifting weights or pilates.  Do not use any  products that contain nicotine or tobacco. This includes cigarettes and e-cigarettes. If you need help quitting, ask your doctor.  Check your blood pressure at home as told by your doctor.  Keep all follow-up visits as told by your doctor. This is important. Medicines  Take over-the-counter and prescription medicines only as told by your doctor. Follow directions carefully.  Do not skip doses of blood pressure medicine. The medicine does not work as well if you skip doses. Skipping doses also puts you at risk for problems.  Ask your doctor about side effects or reactions to medicines that you should watch for. Contact a doctor if:  You think you are having a reaction to the medicine you are taking.  You have headaches that keep coming back (recurring).  You feel dizzy.  You have swelling in your ankles.  You have trouble with your vision. Get help right away if:  You get a very bad headache.  You start to feel confused.  You feel weak or numb.  You feel faint.  You get very bad pain in your: ? Chest. ? Belly (abdomen).  You throw up (vomit) more than once.  You have trouble breathing. Summary  Hypertension is another name for high blood pressure.  Making healthy choices can help lower blood pressure. If your blood pressure cannot be controlled with healthy choices, you may need to take medicine. This information is not intended to replace advice given to you by your health care   provider. Make sure you discuss any questions you have with your health care provider. Document Released: 12/31/2007 Document Revised: 06/11/2016 Document Reviewed: 06/11/2016 Elsevier Interactive Patient Education  2018 [employer] Inc[employer]  DASH Eating Plan DASH stands for "Dietary Approaches to Stop Hypertension." The DASH eating plan is a healthy eating plan that has been shown to reduce high blood pressure (hypertension). It may also reduce your risk for type 2 diabetes, heart disease, and  stroke. The DASH eating plan may also help with weight loss. What are tips for following this plan? General guidelines  Avoid eating more than 2,300 mg (milligrams) of salt (sodium) a day. If you have hypertension, you may need to reduce your sodium intake to 1,500 mg a day.  Limit alcohol intake to no more than 1 drink a day for nonpregnant women and 2 drinks a day for men. One drink equals 12 oz of beer, 5 oz of wine, or 1 oz of hard liquor.  Work with your health care provider to maintain a healthy body weight or to lose weight. Ask what an ideal weight is for you.  Get at least 30 minutes of exercise that causes your heart to beat faster (aerobic exercise) most days of the week. Activities may include walking, swimming, or biking.  Work with your health care provider or diet and nutrition specialist (dietitian) to adjust your eating plan to your individual calorie needs. Reading food labels  Check food labels for the amount of sodium per serving. Choose foods with less than 5 percent of the Daily Value of sodium. Generally, foods with less than 300 mg of sodium per serving fit into this eating plan.  To find whole grains, look for the word "whole" as the first word in the ingredient list. Shopping  Buy products labeled as "low-sodium" or "no salt added."  Buy fresh foods. Avoid canned foods and premade or frozen meals. Cooking  Avoid adding salt when cooking. Use salt-free seasonings or herbs instead of table salt or sea salt. Check with your health care provider or pharmacist before using salt substitutes.  Do not fry foods. Cook foods using healthy methods such as baking, boiling, grilling, and broiling instead.  Cook with heart-healthy oils, such as olive, canola, soybean, or sunflower oil. Meal planning   Eat a balanced diet that includes: ? 5 or more servings of fruits and vegetables each day. At each meal, try to fill half of your plate with fruits and vegetables. ? Up  to 6-8 servings of whole grains each day. ? Less than 6 oz of lean meat, poultry, or fish each day. A 3-oz serving of meat is about the same size as a deck of cards. One egg equals 1 oz. ? 2 servings of low-fat dairy each day. ? A serving of nuts, seeds, or beans 5 times each week. ? Heart-healthy fats. Healthy fats called Omega-3 fatty acids are found in foods such as flaxseeds and coldwater fish, like sardines, salmon, and mackerel.  Limit how much you eat of the following: ? Canned or prepackaged foods. ? Food that is high in trans fat, such as fried foods. ? Food that is high in saturated fat, such as fatty meat. ? Sweets, desserts, sugary drinks, and other foods with added sugar. ? Full-fat dairy products.  Do not salt foods before eating.  Try to eat at least 2 vegetarian meals each week.  Eat more home-cooked food and less restaurant, buffet, and fast food.  When eating at a restaurant,   Canned or prepackaged foods.  ? Food that is high in trans fat, such as fried foods.  ? Food that is high in saturated fat, such as fatty meat.  ? Sweets, desserts, sugary drinks, and other foods with added sugar.  ? Full-fat dairy products.   Do not salt foods before eating.   Try to eat at least 2 vegetarian meals each week.   Eat more home-cooked food and less restaurant, buffet, and fast food.   When eating at a restaurant, ask that your food be prepared with less salt or no salt, if possible.  What foods are recommended?  The items listed may not be a complete list. Talk with your dietitian about what dietary choices are best for you.  Grains  Whole-grain or whole-wheat bread. Whole-grain or whole-wheat pasta. Brown rice. Oatmeal. Quinoa. Bulgur. Whole-grain and low-sodium cereals. Pita bread. Low-fat, low-sodium crackers. Whole-wheat flour tortillas.  Vegetables  Fresh or frozen vegetables (raw, steamed, roasted, or grilled). Low-sodium or reduced-sodium tomato and vegetable juice. Low-sodium or reduced-sodium tomato sauce and tomato paste. Low-sodium or reduced-sodium canned vegetables.  Fruits  All fresh, dried, or frozen fruit. Canned fruit in natural juice (without added sugar).  Meat and other protein foods  Skinless chicken or turkey. Ground chicken or turkey. Pork with fat trimmed off. Fish and seafood. Egg whites. Dried beans, peas, or lentils. Unsalted nuts, nut butters, and seeds. Unsalted canned beans. Lean cuts of  beef with fat trimmed off. Low-sodium, lean deli meat.  Dairy  Low-fat (1%) or fat-free (skim) milk. Fat-free, low-fat, or reduced-fat cheeses. Nonfat, low-sodium ricotta or cottage cheese. Low-fat or nonfat yogurt. Low-fat, low-sodium cheese.  Fats and oils  Soft margarine without trans fats. Vegetable oil. Low-fat, reduced-fat, or light mayonnaise and salad dressings (reduced-sodium). Canola, safflower, olive, soybean, and sunflower oils. Avocado.  Seasoning and other foods  Herbs. Spices. Seasoning mixes without salt. Unsalted popcorn and pretzels. Fat-free sweets.  What foods are not recommended?  The items listed may not be a complete list. Talk with your dietitian about what dietary choices are best for you.  Grains  Baked goods made with fat, such as croissants, muffins, or some breads. Dry pasta or rice meal packs.  Vegetables  Creamed or fried vegetables. Vegetables in a cheese sauce. Regular canned vegetables (not low-sodium or reduced-sodium). Regular canned tomato sauce and paste (not low-sodium or reduced-sodium). Regular tomato and vegetable juice (not low-sodium or reduced-sodium). Pickles. Olives.  Fruits  Canned fruit in a light or heavy syrup. Fried fruit. Fruit in cream or butter sauce.  Meat and other protein foods  Fatty cuts of meat. Ribs. Fried meat. Bacon. Sausage. Bologna and other processed lunch meats. Salami. Fatback. Hotdogs. Bratwurst. Salted nuts and seeds. Canned beans with added salt. Canned or smoked fish. Whole eggs or egg yolks. Chicken or turkey with skin.  Dairy  Whole or 2% milk, cream, and half-and-half. Whole or full-fat cream cheese. Whole-fat or sweetened yogurt. Full-fat cheese. Nondairy creamers. Whipped toppings. Processed cheese and cheese spreads.  Fats and oils  Butter. Stick margarine. Lard. Shortening. Ghee. Bacon fat. Tropical oils, such as coconut, palm kernel, or palm oil.  Seasoning and other foods  Salted popcorn and pretzels. Onion salt, garlic salt, seasoned  salt, table salt, and sea salt. Worcestershire sauce. Tartar sauce. Barbecue sauce. Teriyaki sauce. Soy sauce, including reduced-sodium. Steak sauce. Canned and packaged gravies. Fish sauce. Oyster sauce. Cocktail sauce. Horseradish that you find on the shelf. Ketchup. Mustard. Meat flavorings and tenderizers.   fruits and vegetables, whole grains, lean proteins, low-fat dairy, and heart-healthy fats.  Work with your health care provider or diet and nutrition specialist (dietitian) to adjust your eating plan to your individual calorie needs. This information is not intended to replace advice given to you by your health care provider. Make sure you discuss any questions you have with your health care provider. Document Released: 07/03/2011 Document Revised: 07/07/2016 Document Reviewed: 07/07/2016 Elsevier Interactive Patient Education  2018 Elsevier Inc.  

## 2018-03-18 DIAGNOSIS — H40013 Open angle with borderline findings, low risk, bilateral: Secondary | ICD-10-CM | POA: Insufficient documentation

## 2018-03-18 MED FILL — LUMIGAN 0.01% EYE DROPS: 0.01 | 20 days supply | Qty: 3 | Fill #0

## 2018-03-25 ENCOUNTER — Ambulatory Visit: Payer: Self-pay | Attending: Family Medicine

## 2018-03-25 MED FILL — IBUPROFEN 800 MG TABLET: 800 | 7 days supply | Qty: 30 | Fill #0

## 2018-04-09 MED FILL — VALSARTAN 160 MG TABLET: 160 | 30 days supply | Qty: 30 | Fill #1

## 2018-04-15 ENCOUNTER — Ambulatory Visit: Payer: Self-pay | Admitting: Family Medicine

## 2018-04-20 ENCOUNTER — Telehealth: Payer: Self-pay | Admitting: Family Medicine

## 2018-04-20 NOTE — Telephone Encounter (Signed)
Will forward to pcp

## 2018-04-20 NOTE — Telephone Encounter (Signed)
Patient called because she is experiencing a reactiong from her medication Valsartan 160 mg. Please follow up with patient.

## 2018-04-20 NOTE — Telephone Encounter (Signed)
I attempted to call patient but the call went to voicemail and message left for patient to call the clinic and phone number provided. In the future, please notify me in person and/or call this patient or any other patient back immediately to find out what is going on in case patient may be having a serious medication reaction that may need further medical attention

## 2018-04-22 ENCOUNTER — Telehealth: Payer: Self-pay | Admitting: Family Medicine

## 2018-04-22 MED ORDER — LISINOPRIL 20 MG PO TABS: 20.0000 mg | ORAL_TABLET | Freq: Every day | ORAL | 3 refills | Status: DC

## 2018-04-22 MED FILL — LISINOPRIL 20 MG TAB: 20 | 30 days supply | Qty: 30 | Fill #0

## 2018-04-22 NOTE — Telephone Encounter (Addendum)
Pt was brought back to be assessed. She c/o that since taking Valsartan 160 mg, she has noticed that her eyes itching and some discomfort on right side 2 days ago. She states blood pressures have still been elevated in the 161-096'E  systolic and 45-40 diastolic when she checks them at home. Today blood pressure was 174/92.  Pt continues to take medication. She has seen Dr. Karin Golden and regularly takes [facility] gtts. X 2 years.  She does not take Chlorothiazide 250 mg or chlorthildone 25mg   She was instructed by Lurena Joiner, Clinical Pharmacist [facility] to: Stop valsartan 160mg  Start Lisinopril 20 mg Return in 2 weeks. Appt scheduled 05/07/2018 at 3pm

## 2018-04-22 NOTE — Telephone Encounter (Addendum)
Pt came in to inform her provider or nurse that since she began using -Valsartan 160 mg She began having the following side effects -eye itchiness -eye soreness,  -upper stomach pain -trouble sleeping Message routed to triage nurse

## 2018-04-26 ENCOUNTER — Other Ambulatory Visit: Payer: Self-pay | Admitting: Pharmacist

## 2018-04-26 MED ORDER — ENALAPRIL MALEATE 10 MG PO TABS: 10.0000 mg | ORAL_TABLET | Freq: Two times a day (BID) | ORAL | 2 refills | Status: DC

## 2018-04-26 MED FILL — ENALAPRIL MALEATE 10 MG TAB: 10 | 30 days supply | Qty: 60 | Fill #0

## 2018-04-26 NOTE — Progress Notes (Signed)
Thank you :)

## 2018-04-26 NOTE — Progress Notes (Signed)
Rec call from patient. States that her lisinopril was causing chest tightness/palpitations. She has since stopped and notes improvement.  She is requesting to go back to her enalapril. She denies additional chest pressure, HA, SOB, or LE edema. She is seeing me next week for a BP re-check.   Will send in enalapril at previous dose and re-check next week. I have forwarded this information to patient's PCP.

## 2018-04-29 ENCOUNTER — Ambulatory Visit: Payer: Self-pay | Attending: Family Medicine

## 2018-05-07 ENCOUNTER — Encounter: Payer: Self-pay | Admitting: Pharmacist

## 2018-05-18 ENCOUNTER — Telehealth: Payer: Self-pay | Admitting: Family Medicine

## 2018-05-18 NOTE — Telephone Encounter (Signed)
Patient called because she would like to schedule an appointment. Patient was informed there are no more financial appointments available and she would have to call back on Monday however, patient would like to see if she could be scheduled in. Patient says the [employer] sent more forms. She says she needs an appointment after 3:00.

## 2018-05-31 MED FILL — LUMIGAN 0.01% EYE DROPS: 0.01 | 20 days supply | Qty: 3 | Fill #1

## 2018-05-31 MED FILL — ENALAPRIL MALEATE 10 MG TAB: 10 | 30 days supply | Qty: 60 | Fill #1

## 2018-05-31 MED FILL — METOPROLOL TARTRATE 25 MG T: 25 | 90 days supply | Qty: 180 | Fill #2

## 2018-06-03 ENCOUNTER — Ambulatory Visit: Payer: Self-pay | Attending: Family Medicine

## 2018-06-17 ENCOUNTER — Telehealth: Payer: Self-pay | Admitting: Family Medicine

## 2018-06-17 NOTE — Telephone Encounter (Signed)
Patient came in to drop a bill off. Patient was informed that CAFA is still pending. She would like for you to follow up with an update on CAFA status

## 2018-06-30 MED FILL — ENALAPRIL MALEATE 10 MG TAB: 10 | 30 days supply | Qty: 60 | Fill #2

## 2018-07-07 ENCOUNTER — Telehealth: Payer: Self-pay | Admitting: Family Medicine

## 2018-07-07 NOTE — Telephone Encounter (Signed)
Patient called and expressed concernec about her back swelling. No soon Available appointments.   Patient was advised to go to the ED or UC if she needed to be seen sooner than her appointment.

## 2018-08-05 ENCOUNTER — Ambulatory Visit: Payer: Self-pay | Admitting: Family Medicine

## 2018-08-13 ENCOUNTER — Other Ambulatory Visit: Payer: Self-pay | Admitting: Family Medicine

## 2018-08-13 NOTE — Progress Notes (Signed)
Patient ID: Theresa [name], female   DOB: [birth year], 73 y.o.   MRN: [id number]  Per pharmacy technician, patient was here with someone else and requested a refill of blood pressure medication, amlodipine while she was here. Patient has not been on this medication in awhile and it is listed on her allergy list due to a side effect of lower extremity swelling. Patient has not had a office visit in follow-up of blood pressure since 03/15/18. Since she is requesting a medication that is not currently prescribed for her and since she has not been seen in awhile she will need an office visit.

## 2018-08-16 ENCOUNTER — Encounter (HOSPITAL_COMMUNITY): Payer: Self-pay | Admitting: Emergency Medicine

## 2018-08-16 ENCOUNTER — Ambulatory Visit (HOSPITAL_COMMUNITY)
Admission: EM | Admit: 2018-08-16 | Discharge: 2018-08-16 | Disposition: A | Discharge status: Home / Self Care | Payer: Self-pay | Attending: Family Medicine | Admitting: Family Medicine

## 2018-08-16 ENCOUNTER — Telehealth (HOSPITAL_COMMUNITY): Payer: Self-pay | Admitting: Emergency Medicine

## 2018-08-16 DIAGNOSIS — K0889 Other specified disorders of teeth and supporting structures: Secondary | ICD-10-CM

## 2018-08-16 DIAGNOSIS — R42 Dizziness and giddiness: Secondary | ICD-10-CM

## 2018-08-16 LAB — BASIC METABOLIC PANEL
Anion gap: 8 (ref 5–15)
BUN: 9 mg/dL (ref 8–23)
CALCIUM: 9.2 mg/dL (ref 8.9–10.3)
CO2: 26 mmol/L (ref 22–32)
Chloride: 106 mmol/L (ref 98–111)
Creatinine, Ser: 0.97 mg/dL (ref 0.44–1.00)
GFR, EST NON AFRICAN AMERICAN: 58 mL/min — AB (ref >60)
Glucose, Bld: 110 mg/dL — ABNORMAL HIGH (ref 70–99)
Potassium: 4.1 mmol/L (ref 3.5–5.1)
Sodium: 140 mmol/L (ref 135–145)

## 2018-08-16 LAB — CBC
HEMATOCRIT: 38.2 % (ref 36.0–46.0)
Hemoglobin: 12.8 g/dL (ref 12.0–15.0)
MCH: 27.6 pg (ref 26.0–34.0)
MCHC: 33.5 g/dL (ref 30.0–36.0)
MCV: 82.5 fL (ref 80.0–100.0)
PLATELETS: 105 10*3/uL — AB (ref 150–400)
RBC: 4.63 MIL/uL (ref 3.87–5.11)
RDW: 13.4 % (ref 11.5–15.5)
WBC: 4.3 10*3/uL (ref 4.0–10.5)
nRBC: 0 % (ref 0.0–0.2)

## 2018-08-16 MED ORDER — CLINDAMYCIN HCL 300 MG PO CAPS: 300.0000 mg | ORAL_CAPSULE | Freq: Three times a day (TID) | ORAL | 0 refills | Status: AC

## 2018-08-16 MED FILL — CLINDAMYCIN HCL 300 MG CAP: 300 | 7 days supply | Qty: 21 | Fill #0

## 2018-08-16 MED FILL — ENALAPRIL MALEATE 10 MG TAB: 10 | 90 days supply | Qty: 180 | Fill #1

## 2018-08-16 NOTE — Telephone Encounter (Signed)
Per [facility] PA, Results are within normal range. Pt contacted and made aware. Verbalized understanding. Instructed to follow up with PCP.

## 2018-08-16 NOTE — ED Provider Notes (Signed)
[place]    CSN: [id number] Arrival date & time: 08/16/18  1217     History   Chief Complaint Chief Complaint  Patient presents with  . Dental Pain  . Dizziness    HPI Theresa [name] is a 73 y.o. female history of hyperlipidemia, previous gastric ulcer, presenting today for evaluation of dental pain as well as dizziness.  Patient states that over the past few months she has had discomfort in her left lower jaw.  Of recently she has developed some numbness in this area.  Over the past 4 days she is also had increased dizziness and weakness.  She denies difficulty speaking or changes in vision.  She notes that she has had issues with focusing and seeing for a while, but no new acute changes.  She has plans to go see an eye doctor.  She denies chest pain or shortness of breath.  Denies any nausea or vomiting.  She has felt off balance.  Does admit to being sick recently with cough, but states that this has resolved with antibiotics.  She is also concerned about her blood pressure.  She has plans to follow-up with her PCP on Wednesday but wanted to be seen today.  Patient's granddaughter on the phone who denies any changes in her speech, walker mentation. HPI  Past Medical History:  Diagnosis Date  . Gastric ulcer   . Hypertension   . River blindness   . Sinusitis     Patient Active Problem List   Diagnosis Date Noted  . Non-adherence to medical treatment 01/15/2017  . Hyperlipidemia 06/09/2016  . White coat syndrome with diagnosis of hypertension 10/02/2015  . Insomnia 10/02/2015  . Palpitations 10/02/2015    Past Surgical History:  Procedure Laterality Date  . APPENDECTOMY    . BREAST LUMPECTOMY      OB History   No obstetric history on file.      Home Medications    Prior to Admission medications   Medication Sig Start Date End Date Taking? Authorizing Provider  acetaminophen (TYLENOL) 500 MG tablet Take 500 mg by mouth every 6 (six) hours as  needed for mild pain or moderate pain.    [provider]  aspirin 81 MG tablet Take 1 tablet (81 mg total) by mouth daily. Reported on 10/02/2015 12/31/17   Argentina Donovan, PA-C  chlorothiazide (DIURIL) 250 MG tablet Take 1 tablet (250 mg total) by mouth every morning. Patient not taking: Reported on 02/25/2018 12/31/17   Argentina Donovan, PA-C  chlorthalidone (HYGROTON) 25 MG tablet Take 1 tablet (25 mg total) by mouth daily. Patient not taking: Reported on 02/25/2018 12/31/17   Argentina Donovan, PA-C  clindamycin (CLEOCIN) 300 MG capsule Take 1 capsule (300 mg total) by mouth 3 (three) times daily for 7 days. 08/16/18 08/23/18  ,  C, PA-C  enalapril (VASOTEC) 10 MG tablet Take 1 tablet (10 mg total) by mouth 2 (two) times daily. 04/26/18   Charlott Rakes, MD  ezetimibe (ZETIA) 10 MG tablet Take 1 tablet (10 mg total) by mouth daily. Patient not taking: Reported on 02/25/2018 12/31/17   Argentina Donovan, PA-C  folic acid (FOLVITE) 409 MCG tablet Take 400 mcg by mouth daily.    [provider]  ivermectin (STROMECTOL) 3 MG TABS tablet Take 150 mcg/kg by mouth once. Reported on 10/10/2015    [provider]  metoprolol tartrate (LOPRESSOR) 25 MG tablet Take 1 tablet (25 mg total) by mouth 2 (  two) times daily. 12/31/17   Argentina Donovan, PA-C  Multiple Vitamin (MULTIVITAMIN) tablet Take 1 tablet by mouth daily.    [provider]  pantoprazole (PROTONIX) 40 MG tablet Take 1 tablet (40 mg total) by mouth daily. Patient not taking: Reported on 02/25/2018 10/02/15   Maren Reamer, MD  Polyethyl Glycol-Propyl Glycol (EQ LUBRICANT EYE DROPS) 0.4-0.3 % SOLN Apply 11 drops to eye every 2 (two) hours as needed (for burning eyes). Patient not taking: Reported on 02/25/2018 11/03/16   Maren Reamer, MD    Family History Family History  Problem Relation Age of Onset  . Heart disease Mother     Social History Social History   Tobacco Use  . Smoking status: Never  Smoker  Substance Use Topics  . Alcohol use: No  . Drug use: No     Allergies   Amlodipine; Coreg [carvedilol]; Shellfish allergy; Trazodone and nefazodone; Hydralazine hcl; Augmentin [amoxicillin-pot clavulanate]; Clonidine derivatives; Lisinopril; and Lisinopril-hydrochlorothiazide   Review of Systems Review of Systems  Constitutional: Negative for fatigue and fever.  HENT: Positive for dental problem. Negative for congestion, sinus pressure and sore throat.   Eyes: Negative for photophobia, pain and visual disturbance.  Respiratory: Negative for cough and shortness of breath.   Cardiovascular: Negative for chest pain.  Gastrointestinal: Negative for abdominal pain, nausea and vomiting.  Genitourinary: Negative for decreased urine volume and hematuria.  Musculoskeletal: Negative for myalgias, neck pain and neck stiffness.  Neurological: Positive for dizziness. Negative for syncope, facial asymmetry, speech difficulty, weakness, light-headedness, numbness and headaches.     Physical Exam Triage Vital Signs ED Triage Vitals [08/16/18 1320]  Enc Vitals Group     BP (!) 227/75     Pulse Rate 63     Resp 18     Temp 99.3 F (37.4 C)     Temp Source Temporal     SpO2 100 %     Weight      Height      Head Circumference      Peak Flow      Pain Score 6     Pain Loc      Pain Edu?      Excl. in [place]?    No data found.  Updated Vital Signs BP (!) 227/75 (BP Location: Left Arm)   Pulse 63   Temp 99.3 F (37.4 C) (Temporal)   Resp 18   SpO2 100%   Visual Acuity Right Eye Distance:   Left Eye Distance:   Bilateral Distance:    Right Eye Near:   Left Eye Near:    Bilateral Near:     Physical Exam Vitals signs and nursing note reviewed.  Constitutional:      General: She is not in acute distress.    Appearance: She is well-developed.     Comments: Easily becomes emotional when talking about her orange card being expired, elevated blood pressure  HENT:      Head: Normocephalic and atraumatic.     Ears:     Comments: Bilateral ears without tenderness to palpation of external auricle, tragus and mastoid, EAC's without erythema or swelling, TM's with good bony landmarks and cone of light. Non erythematous.    Nose:     Comments: Nasal mucosa pink, no turbinate swelling    Mouth/Throat:     Comments: Oral mucosa pink and moist, no tonsillar enlargement or exudate. Posterior pharynx patent and nonerythematous, no uvula deviation or swelling. Normal phonation.  Missing posterior second to last molar on left lower jaw, mild gingival tenderness Eyes:     Extraocular Movements: Extraocular movements intact.     Conjunctiva/sclera: Conjunctivae normal.     Pupils: Pupils are equal, round, and reactive to light.  Neck:     Musculoskeletal: Neck supple.  Cardiovascular:     Rate and Rhythm: Normal rate and regular rhythm.     Heart sounds: No murmur.  Pulmonary:     Effort: Pulmonary effort is normal. No respiratory distress.     Breath sounds: Normal breath sounds.     Comments: Breathing comfortably at rest, CTABL, no wheezing, rales or other adventitious sounds auscultated Abdominal:     Palpations: Abdomen is soft.     Tenderness: There is no abdominal tenderness.  Musculoskeletal:     Comments: Able to ambulate from chair to exam table without assistance  Skin:    General: Skin is warm and dry.  Neurological:     General: No focal deficit present.     Mental Status: She is alert and oriented to person, place, and time. Mental status is at baseline.     Comments: Patient A&O x3, cranial nerves II-XII grossly intact, strength at shoulders, hips and knees 5/5, equal bilaterally, patellar reflex 2+ bilaterally. Normal Finger to nose, RAM and heel to shin. Gait without abnormality.       UC Treatments / Results  Labs (all labs ordered are listed, but only abnormal results are displayed) Labs Reviewed  CBC - Abnormal; Notable for the  following components:      Result Value   Platelets 105 (*)    All other components within normal limits  BASIC METABOLIC PANEL - Abnormal; Notable for the following components:   Glucose, Bld 110 (*)    GFR calc non Af Amer 58 (*)    All other components within normal limits    EKG None  Radiology No results found.  Procedures Procedures (including critical care time)  Medications Ordered in UC Medications - No data to display  Initial Impression / Assessment and Plan / UC Course  I have reviewed the triage vital signs and the nursing notes.  Pertinent labs & imaging results that were available during my care of the patient were reviewed by me and considered in my medical decision making (see chart for details).     Elderly patient with left-sided jaw discomfort, dizziness, no neuro deficit, blood pressure significantly elevated today rechecked and was 213/97.  EKG normal sinus rhythm, no acute signs of ischemia or infarction.  Will draw CBC and CMP to check electrolytes, hemoglobin.  Will call patient with results if abnormal.  Patient allergic to clonidine, was going to provide this to patient prior to discharge for her blood pressure.  Will defer and ensure patient is taking her blood pressure medicines and follow-up with PCP in 2 days as planned.  In the interim if having increased dizziness, weakness, changes in vision, developing headache, chest pain go to emergency room.  Will treat for dental infection with clindamycin, possible symptoms related to this versus blood pressure.  Discussed strict return precautions. Patient verbalized understanding and is agreeable with plan.  Final Clinical Impressions(s) / UC Diagnoses   Final diagnoses:  Pain, dental  Dizziness     Discharge Instructions     Your EKG was normal We will call if blood work looks abnormal Follow up with primary care as planned  Begin clindamycin every 8 hours for 1 week  to treat  infection  Please go to emergency room if developing increased dizziness, weakness, vision changes, headache   ED Prescriptions    Medication Sig Dispense Auth. Provider   clindamycin (CLEOCIN) 300 MG capsule Take 1 capsule (300 mg total) by mouth 3 (three) times daily for 7 days. 21 capsule ,  C, PA-C     Controlled Substance Prescriptions [place] Controlled Substance Registry consulted? Not Applicable   Janith Lima, [place] 08/16/18 1609

## 2018-08-16 NOTE — ED Triage Notes (Signed)
Pt sts left sided tooth pain and some gen weakness and dizziness x 3 days

## 2018-08-16 NOTE — Discharge Instructions (Addendum)
Your EKG was normal We will call if blood work looks abnormal Follow up with primary care as planned  Begin clindamycin every 8 hours for 1 week to treat infection  Please go to emergency room if developing increased dizziness, weakness, vision changes, headache

## 2018-08-17 NOTE — Progress Notes (Signed)
Patient ID: Theresa [name], female   DOB: [birth year], 73 y.o.   MRN: [id number]      Theresa [name], is a 73 y.o. female  ELF:[id number]  HEN:[id number]  DOB - 19-Mar-1946  Subjective:  Chief Complaint and HPI: Theresa [name] is a 73 y.o. female here today for f/up after being seen at the ED for dizziness and tooth pain 08/16/2018. She is feeling much better since then.  She has been checking her BP and pulse at home and says that it is always better at home.  Numbers range 140s/70s.  Pulse about 65.  Denies CP or dizziness today.  Tooth pain is much improved.    ED/Hospital notes reviewed.  After being seen in the ED 08/16/2018:  From ED HPI:  73 y.o. female history of hyperlipidemia, previous gastric ulcer, presenting today for evaluation of dental pain as well as dizziness.  Patient states that over the past few months she has had discomfort in her left lower jaw.  Of recently she has developed some numbness in this area.  Over the past 4 days she is also had increased dizziness and weakness.  She denies difficulty speaking or changes in vision.  She notes that she has had issues with focusing and seeing for a while, but no new acute changes.  She has plans to go see an eye doctor.  She denies chest pain or shortness of breath.  Denies any nausea or vomiting.  She has felt off balance.  Does admit to being sick recently with cough, but states that this has resolved with antibiotics.  She is also concerned about her blood pressure.  She has plans to follow-up with her PCP on Wednesday but wanted to be seen today.  From ED A/P: Elderly patient with left-sided jaw discomfort, dizziness, no neuro deficit, blood pressure significantly elevated today rechecked and was 213/97.  EKG normal sinus rhythm, no acute signs of ischemia or infarction.  Will draw CBC and CMP to check electrolytes, hemoglobin.  Will call patient with results if abnormal.  Patient allergic to clonidine, was going to provide  this to patient prior to discharge for her blood pressure.  Will defer and ensure patient is taking her blood pressure medicines and follow-up with PCP in 2 days as planned.  In the interim if having increased dizziness, weakness, changes in vision, developing headache, chest pain go to emergency room.  Will treat for dental infection with clindamycin, possible symptoms related to this versus blood pressure.   ROS:   Constitutional:  No f/c, No night sweats, No unexplained weight loss. EENT:   No hearing changes. No mouth, throat, or ear problems.  Respiratory: No cough, No SOB Cardiac: No CP, no palpitations GI:  No abd pain, No N/V/D. GU: No Urinary s/sx Musculoskeletal: No joint pain Neuro: No headache, no dizziness, no motor weakness.  Skin: No rash Endocrine:  No polydipsia. No polyuria.  Psych: Denies SI/HI  No problems updated.  ALLERGIES: Allergies  Allergen Reactions  . Amlodipine Swelling  . Coreg [Carvedilol] Other (See Comments)    Drowsiness, dizziness, palpitations  . Shellfish Allergy Swelling  . Trazodone And Nefazodone Swelling    Possible eye pain and swelling  . Hydralazine Hcl Palpitations  . Augmentin [Amoxicillin-Pot Clavulanate]   . Clonidine Derivatives Other (See Comments)    "heart came out of chest"  . Lisinopril Palpitations  . Lisinopril-Hydrochlorothiazide Other (See Comments)    Possible flushing, insomnia    PAST MEDICAL HISTORY: Past Medical  History:  Diagnosis Date  . Gastric ulcer   . Hypertension   . River blindness   . Sinusitis     MEDICATIONS AT HOME: Prior to Admission medications   Medication Sig Start Date End Date Taking? Authorizing Provider  acetaminophen (TYLENOL) 500 MG tablet Take 500 mg by mouth every 6 (six) hours as needed for mild pain or moderate pain.    [provider]  aspirin 81 MG tablet Take 1 tablet (81 mg total) by mouth daily. Reported on 10/02/2015 12/31/17   Argentina Donovan, PA-C  clindamycin  (CLEOCIN) 300 MG capsule Take 1 capsule (300 mg total) by mouth 3 (three) times daily for 7 days. 08/16/18 08/23/18  Wieters, Hallie C, PA-C  enalapril (VASOTEC) 10 MG tablet Take 1 tablet (10 mg total) by mouth 2 (two) times daily. 04/26/18   Charlott Rakes, MD  ezetimibe (ZETIA) 10 MG tablet Take 1 tablet (10 mg total) by mouth daily. Patient not taking: Reported on 02/25/2018 12/31/17   Argentina Donovan, PA-C  folic acid (FOLVITE) 546 MCG tablet Take 400 mcg by mouth daily.    [provider]  metoprolol tartrate (LOPRESSOR) 25 MG tablet Take 1 tablet (25 mg total) by mouth 2 (two) times daily. 12/31/17   Argentina Donovan, PA-C  Multiple Vitamin (MULTIVITAMIN) tablet Take 1 tablet by mouth daily.    [provider]  pantoprazole (PROTONIX) 40 MG tablet Take 1 tablet (40 mg total) by mouth daily. Patient not taking: Reported on 02/25/2018 10/02/15   Maren Reamer, MD  Polyethyl Glycol-Propyl Glycol (EQ LUBRICANT EYE DROPS) 0.4-0.3 % SOLN Apply 11 drops to eye every 2 (two) hours as needed (for burning eyes). Patient not taking: Reported on 02/25/2018 11/03/16   Maren Reamer, MD     Objective:  EXAM:   Vitals:   08/18/18 0934  BP: (!) 177/90  Pulse: 65  Resp: 16  Temp: 98 F (36.7 C)  TempSrc: Oral  SpO2: 100%  Weight: 171 lb 12.8 oz (77.9 kg)  Height: 5\' 4"  (1.626 m)    General appearance : A&OX3. NAD. Non-toxic-appearing HEENT: Atraumatic and Normocephalic.  PERRLA. EOM intact.  TM clear B. Mouth-MMM, post pharynx WNL w/o erythema, No PND. Chest/Lungs:  Breathing-non-labored, Good air entry bilaterally, breath sounds normal without rales, rhonchi, or wheezing  CVS: S1 S2 regular, no murmurs, gallops, rubs  Neurology:  CN II-XII grossly intact, Non focal.   Psych:  TP linear. J/I WNL. Normal speech. Appropriate eye contact and affect.  Skin:  No Rash  Data Review Lab Results  Component Value Date   HGBA1C 5.4 10/02/2015     Assessment & Plan   1.  Hypertension, uncontrolled Check BP at home 3-4 times/week and record and bring to next visit.  Continue current regimen(readings much better there-see above in HPI)  2. Encounter for examination following treatment at hospital Much improved  3. Dizziness resolved  4. Tooth pain Much improved   Patient have been counseled extensively about nutrition and exercise  Return in about 1 month (around 09/18/2018) for for BP f/up qith Dr Chapman Fitch.  The patient was given clear instructions to go to ER or return to medical center if symptoms don't improve, worsen or new problems develop. The patient verbalized understanding. The patient was told to call to get lab results if they haven't heard anything in the next week.     Freeman Caldron, PA-C [facility] [place], [place]  08/18/2018, 10:03 AM

## 2018-08-18 ENCOUNTER — Ambulatory Visit: Payer: Self-pay | Attending: Family Medicine | Admitting: Physician Assistant

## 2018-08-18 VITALS — BP 177/90 | HR 65 | Temp 98.0°F | Resp 16 | Ht 64.0 in | Wt 171.8 lb

## 2018-08-18 DIAGNOSIS — K0889 Other specified disorders of teeth and supporting structures: Secondary | ICD-10-CM

## 2018-08-18 DIAGNOSIS — R42 Dizziness and giddiness: Secondary | ICD-10-CM

## 2018-08-18 DIAGNOSIS — Z09 Encounter for follow-up examination after completed treatment for conditions other than malignant neoplasm: Secondary | ICD-10-CM

## 2018-08-18 DIAGNOSIS — I10 Essential (primary) hypertension: Secondary | ICD-10-CM

## 2018-08-18 NOTE — Patient Instructions (Signed)
Check blood pressure and pulse 3-4 times per week and record and bring to next visit.

## 2018-08-31 ENCOUNTER — Ambulatory Visit: Payer: Self-pay

## 2018-08-31 MED FILL — METOPROLOL TARTRATE 25 MG T: 25 | 90 days supply | Qty: 180 | Fill #3

## 2018-08-31 MED FILL — LUMIGAN 0.01% EYE DROPS: 0.01 | 18 days supply | Qty: 3 | Fill #0

## 2018-09-06 ENCOUNTER — Telehealth: Payer: Self-pay | Admitting: Family Medicine

## 2018-09-06 NOTE — Telephone Encounter (Signed)
Patient called to check on documents needed. Please follow up.

## 2018-09-07 NOTE — Telephone Encounter (Signed)
I spoke with Pt daughter to inform that we need a letter from her to complete her mom application

## 2018-09-08 NOTE — Telephone Encounter (Signed)
Patient says she has her application and IRS letter would like to talk to you for future steps.

## 2018-09-08 NOTE — Telephone Encounter (Signed)
Pt was call to let her know that she can try to do the walking on Friday or Monday morning

## 2018-09-10 ENCOUNTER — Ambulatory Visit: Payer: Self-pay | Attending: Family Medicine

## 2018-09-21 ENCOUNTER — Encounter: Payer: Self-pay | Admitting: Family Medicine

## 2018-09-21 ENCOUNTER — Ambulatory Visit: Payer: Self-pay | Attending: Family Medicine | Admitting: Family Medicine

## 2018-09-21 VITALS — BP 162/102 | HR 60 | Temp 97.9°F | Resp 18 | Ht 64.0 in | Wt 168.0 lb

## 2018-09-21 DIAGNOSIS — I10 Essential (primary) hypertension: Secondary | ICD-10-CM

## 2018-09-21 DIAGNOSIS — F418 Other specified anxiety disorders: Secondary | ICD-10-CM

## 2018-09-21 DIAGNOSIS — R4589 Other symptoms and signs involving emotional state: Secondary | ICD-10-CM

## 2018-09-21 MED ORDER — METOPROLOL TARTRATE 25 MG PO TABS: 25.0000 mg | ORAL_TABLET | Freq: Two times a day (BID) | ORAL | 0 refills | Status: DC

## 2018-09-21 MED ORDER — ENALAPRIL MALEATE 10 MG PO TABS: 10.0000 mg | ORAL_TABLET | Freq: Two times a day (BID) | ORAL | 2 refills | Status: DC

## 2018-09-21 MED ORDER — CLONIDINE HCL 0.1 MG PO TABS: 0.1000 mg | ORAL_TABLET | Freq: Two times a day (BID) | ORAL | 1 refills | Status: DC

## 2018-09-21 MED FILL — cloNIDine HCL 0.1 MG TABS: 0.1 | 30 days supply | Qty: 60 | Fill #0

## 2018-09-21 NOTE — Progress Notes (Signed)
Subjective:    Patient ID: Darrold Span, female    DOB: 1946/05/17, 73 y.o.   MRN: [id number]  HPI       73 yo female seen in follow-up of uncontrolled hypertension.  Patient was recently seen in the office on 08/18/2018 by another provider in follow-up of emergency department visit on 08/16/2018 secondary to dental pain.  At patient's emergency department visit, her blood pressure was 227/75.  Patient was placed on clindamycin for her dental issue and was told to follow-up in clinic.  Blood pressure at recent office visit on 08/18/2018 was 177/90.  Patient reported that her blood pressures at home were always normal near 140/90 therefore no medications were added at that time.        At today's visit, patient reports that she is taking metoprolol and enalapril 10 mg.  Patient however admits that her home blood pressures are sometimes higher, in the 160s to 180s over 90s and patient states that she spoke with someone who suggested that patient try a clonidine patch.  I discussed with the patient that clonidine was listed as an allergy on her medication list however patient states that she can take clonidine 0.1 mg without issues but when she was placed on a higher dose of the pill form in the past, 0.2 mg that this medication caused her to have palpitations.  Patient would like to be on clonidine again to help control her blood pressure.  Patient specifically wishes to be on a clonidine patch.      Patient reports that she is worried about her blood pressure because she was unable to make any medical follow-up for the past few months because her granddaughter misplaced the paperwork for patient to apply for financial assistance through this office and therefore patient did not have any insurance coverage until she was recently able to reapply.  Patient states that she is concerned about her health.  Patient states that she was told that she needs a procedure done on her eyes but that this cannot be  scheduled until her blood pressure is controlled.  Patient denies any headaches, no focal numbness or weakness, but she has had dizziness when she was without blood pressure medicine but states that now her dizziness has mostly resolved.   Past Medical History:  Diagnosis Date  . Gastric ulcer   . Hypertension   . River blindness   . Sinusitis     Current Outpatient Medications on File Prior to Visit  Medication Sig Dispense Refill  . acetaminophen (TYLENOL) 500 MG tablet Take 500 mg by mouth every 6 (six) hours as needed for mild pain or moderate pain.    Marland Kitchen aspirin 81 MG tablet Take 1 tablet (81 mg total) by mouth daily. Reported on 10/02/2015 120 tablet 3  . enalapril (VASOTEC) 10 MG tablet Take 1 tablet (10 mg total) by mouth 2 (two) times daily. 60 tablet 2  . folic acid (FOLVITE) 774 MCG tablet Take 400 mcg by mouth daily.    . metoprolol tartrate (LOPRESSOR) 25 MG tablet Take 1 tablet (25 mg total) by mouth 2 (two) times daily. 180 tablet 3  . Multiple Vitamin (MULTIVITAMIN) tablet Take 1 tablet by mouth daily.    Marland Kitchen ezetimibe (ZETIA) 10 MG tablet Take 1 tablet (10 mg total) by mouth daily. (Patient not taking: Reported on 02/25/2018) 90 tablet 3  . pantoprazole (PROTONIX) 40 MG tablet Take 1 tablet (40 mg total) by mouth daily. (Patient not taking:  Reported on 02/25/2018) 30 tablet 3  . Polyethyl Glycol-Propyl Glycol (EQ LUBRICANT EYE DROPS) 0.4-0.3 % SOLN Apply 11 drops to eye every 2 (two) hours as needed (for burning eyes). (Patient not taking: Reported on 02/25/2018) 10 mL 11   No current facility-administered medications on file prior to visit.     Review of Systems  Constitutional: Positive for fatigue. Negative for chills and fever.  HENT: Negative for hearing loss and trouble swallowing.   Eyes: Positive for visual disturbance (Followed by eye doctor). Negative for photophobia.  Respiratory: Negative for cough and shortness of breath.   Cardiovascular: Negative for chest pain,  palpitations and leg swelling.  Gastrointestinal: Negative for abdominal pain, blood in stool, constipation, diarrhea and nausea.  Endocrine: Negative for polydipsia and polyphagia.  Genitourinary: Negative for dysuria and frequency.  Musculoskeletal: Negative for arthralgias and gait problem.  Neurological: Positive for dizziness (Improved since emergency department visit in January). Negative for headaches.  Hematological: Negative for adenopathy. Does not bruise/bleed easily.       Objective:   Physical Exam Vitals signs and nursing note reviewed.  Neck:     Musculoskeletal: Normal range of motion and neck supple.     Vascular: No carotid bruit.  Cardiovascular:     Rate and Rhythm: Normal rate and regular rhythm.  Pulmonary:     Effort: Pulmonary effort is normal. No respiratory distress.     Breath sounds: Normal breath sounds.  Abdominal:     General: Abdomen is flat.     Palpations: Abdomen is soft.     Tenderness: There is no abdominal tenderness. There is no right CVA tenderness, left CVA tenderness, guarding or rebound.  Musculoskeletal:        General: No swelling or tenderness.     Right lower leg: No edema.     Left lower leg: No edema.  Lymphadenopathy:     Cervical: No cervical adenopathy.  Skin:    General: Skin is warm and dry.  Neurological:     General: No focal deficit present.     Mental Status: She is oriented to person, place, and time.  Psychiatric:     Comments: Patient is anxious    BP (!) 198/78 (BP Location: Left Arm, Patient Position: Sitting, Cuff Size: Normal)   Pulse 60   Temp 97.9 F (36.6 C) (Oral)   Resp 18   Ht 5\' 4"  (1.626 m)   Wt 168 lb (76.2 kg)   SpO2 99%   BMI 28.84 kg/m         Assessment & Plan:  1. Hypertension, uncontrolled Patient with uncontrolled hypertension and patient requested to be placed on clonidine patch.  I discussed with the patient that her allergy list stated that she could not tolerate clonidine  however patient states that she actually was able to tolerate the 0.1 mg dose but the 0.2 mg dose did cause her to feel as if she was having palpitations and that is why she stopped the medication in the past.  Patient reported that her daughter brought her to today's visit and she needed to leave so that her daughter could go to work.  Patient did not want to take clonidine in the office and then be observed as she stated that she needed to leave but patient wished to have prescription sent to the pharmacy for clonidine and patient states that she will return for blood pressure recheck.  Discussed with the patient that she would also need to continue  her current blood pressure medications, metoprolol and enalapril in addition to clonidine 0.1 mg twice daily as her blood pressure was not controlled on 2 medications and she did not wish to try and increase the doses of either the metoprolol or enalapril.  DASH diet also recommended and patient was also asked to make sure that she brings her home blood pressure monitor when she returns for blood pressure recheck with the clinical pharmacist next week.  Patient states that due to transportation issues she cannot return next week therefore she was encouraged to return within the next 2 weeks for recheck of her blood pressure.  2. Anxiety about health Patient reports anxiety about her health and she has been told that she needs upcoming procedures but cannot have them done until her blood pressures controlled.  Discussed with the patient that hopefully with the 3 blood pressure medications that she will be taking that she will have improvement in her blood pressure which will allow her to have the needed procedures and the importance of compliance with her medications as well as follow-up for blood pressure recheck with clinical pharmacist was also stressed.  Patient was asked to call or return sooner if she does have any issues with the medication.  3. Essential  hypertension Patient provided with refill of metoprolol and she reports that she still has enalapril at home.  Patient is aware that she should continue both medications in addition to clonidine 0.1 mg twice daily prescribed at today's visit. - metoprolol tartrate (LOPRESSOR) 25 MG tablet; Take 1 tablet (25 mg total) by mouth 2 (two) times daily.  Dispense: 180 tablet; Refill: 0  An After Visit Summary was printed and given to the patient. Allergies as of 09/21/2018      Reactions   Amlodipine Swelling   Coreg [carvedilol] Other (See Comments)   Drowsiness, dizziness, palpitations   Shellfish Allergy Swelling   Trazodone And Nefazodone Swelling   Possible eye pain and swelling   Hydralazine Hcl Palpitations   Augmentin [amoxicillin-pot Clavulanate]    Clonidine Derivatives Other (See Comments)   "heart came out of chest"   Lisinopril Palpitations   Lisinopril-hydrochlorothiazide Other (See Comments)   Possible flushing, insomnia      Medication List       Accurate as of September 21, 2018 11:59 PM. Always use your most recent med list.        acetaminophen 500 MG tablet Commonly known as:  TYLENOL Take 500 mg by mouth every 6 (six) hours as needed for mild pain or moderate pain.   aspirin 81 MG tablet Take 1 tablet (81 mg total) by mouth daily. Reported on 10/02/2015   cloNIDine 0.1 MG tablet Commonly known as:  Catapres Take 1 tablet (0.1 mg total) by mouth 2 (two) times daily. To lower blood pressure   enalapril 10 MG tablet Commonly known as:  VASOTEC Take 1 tablet (10 mg total) by mouth 2 (two) times daily.   ezetimibe 10 MG tablet Commonly known as:  Zetia Take 1 tablet (10 mg total) by mouth daily.   folic acid 324 MCG tablet Commonly known as:  FOLVITE Take 400 mcg by mouth daily.   metoprolol tartrate 25 MG tablet Commonly known as:  LOPRESSOR Take 1 tablet (25 mg total) by mouth 2 (two) times daily.   multivitamin tablet Take 1 tablet by mouth daily.     pantoprazole 40 MG tablet Commonly known as:  PROTONIX Take 1 tablet (40 mg total) by mouth  daily.   Polyethyl Glycol-Propyl Glycol 0.4-0.3 % Soln Commonly known as:  EQ Lubricant Eye Drops Apply 11 drops to eye every 2 (two) hours as needed (for burning eyes).     -Patient reports that she is not currently taking pantoprazole, lubricant eyedrops or Zetia  Return for HTN-1 week with CPP/Luke and 8 weeks with PCP.

## 2018-09-21 NOTE — Patient Instructions (Signed)
Hypertension Hypertension is another name for high blood pressure. High blood pressure forces your heart to work harder to pump blood. This can cause problems over time. There are two numbers in a blood pressure reading. There is a top number (systolic) over a bottom number (diastolic). It is best to have a blood pressure below 120/80. Healthy choices can help lower your blood pressure. You may need medicine to help lower your blood pressure if:  Your blood pressure cannot be lowered with healthy choices.  Your blood pressure is higher than 130/80. Follow these instructions at home: Eating and drinking   If directed, follow the DASH eating plan. This diet includes: ? Filling half of your plate at each meal with fruits and vegetables. ? Filling one quarter of your plate at each meal with whole grains. Whole grains include whole wheat pasta, brown rice, and whole grain bread. ? Eating or drinking low-fat dairy products, such as skim milk or low-fat yogurt. ? Filling one quarter of your plate at each meal with low-fat (lean) proteins. Low-fat proteins include fish, skinless chicken, eggs, beans, and tofu. ? Avoiding fatty meat, cured and processed meat, or chicken with skin. ? Avoiding premade or processed food.  Eat less than 1,500 mg of salt (sodium) a day.  Limit alcohol use to no more than 1 drink a day for nonpregnant women and 2 drinks a day for men. One drink equals 12 oz of beer, 5 oz of wine, or 1 oz of hard liquor. Lifestyle  Work with your doctor to stay at a healthy weight or to lose weight. Ask your doctor what the best weight is for you.  Get at least 30 minutes of exercise that causes your heart to beat faster (aerobic exercise) most days of the week. This may include walking, swimming, or biking.  Get at least 30 minutes of exercise that strengthens your muscles (resistance exercise) at least 3 days a week. This may include lifting weights or pilates.  Do not use any  products that contain nicotine or tobacco. This includes cigarettes and e-cigarettes. If you need help quitting, ask your doctor.  Check your blood pressure at home as told by your doctor.  Keep all follow-up visits as told by your doctor. This is important. Medicines  Take over-the-counter and prescription medicines only as told by your doctor. Follow directions carefully.  Do not skip doses of blood pressure medicine. The medicine does not work as well if you skip doses. Skipping doses also puts you at risk for problems.  Ask your doctor about side effects or reactions to medicines that you should watch for. Contact a doctor if:  You think you are having a reaction to the medicine you are taking.  You have headaches that keep coming back (recurring).  You feel dizzy.  You have swelling in your ankles.  You have trouble with your vision. Get help right away if:  You get a very bad headache.  You start to feel confused.  You feel weak or numb.  You feel faint.  You get very bad pain in your: ? Chest. ? Belly (abdomen).  You throw up (vomit) more than once.  You have trouble breathing. Summary  Hypertension is another name for high blood pressure.  Making healthy choices can help lower blood pressure. If your blood pressure cannot be controlled with healthy choices, you may need to take medicine. This information is not intended to replace advice given to you by your health care   name for high blood pressure.   Making healthy choices can help lower blood pressure. If your blood pressure cannot be controlled with healthy choices, you may need to take medicine.  This information is not intended to replace advice given to you by your health care provider. Make sure you discuss any questions you have with your health care provider.  Document Released: 12/31/2007 Document Revised: 06/11/2016 Document Reviewed: 06/11/2016  Elsevier Interactive Patient Education  2019 [employer]  DASH Eating Plan  DASH stands for "Dietary Approaches to Stop Hypertension." The DASH eating plan is a healthy eating plan that has been shown to reduce high blood pressure (hypertension). It may also reduce your risk for type 2 diabetes, heart disease, and  stroke. The DASH eating plan may also help with weight loss.  What are tips for following this plan?    General guidelines   Avoid eating more than 2,300 mg (milligrams) of salt (sodium) a day. If you have hypertension, you may need to reduce your sodium intake to 1,500 mg a day.   Limit alcohol intake to no more than 1 drink a day for nonpregnant women and 2 drinks a day for men. One drink equals 12 oz of beer, 5 oz of wine, or 1 oz of hard liquor.   Work with your health care provider to maintain a healthy body weight or to lose weight. Ask what an ideal weight is for you.   Get at least 30 minutes of exercise that causes your heart to beat faster (aerobic exercise) most days of the week. Activities may include walking, swimming, or biking.   Work with your health care provider or diet and nutrition specialist (dietitian) to adjust your eating plan to your individual calorie needs.  Reading food labels     Check food labels for the amount of sodium per serving. Choose foods with less than 5 percent of the Daily Value of sodium. Generally, foods with less than 300 mg of sodium per serving fit into this eating plan.   To find whole grains, look for the word "whole" as the first word in the ingredient list.  Shopping   Buy products labeled as "low-sodium" or "no salt added."   Buy fresh foods. Avoid canned foods and premade or frozen meals.  Cooking   Avoid adding salt when cooking. Use salt-free seasonings or herbs instead of table salt or sea salt. Check with your health care provider or pharmacist before using salt substitutes.   Do not fry foods. Cook foods using healthy methods such as baking, boiling, grilling, and broiling instead.   Cook with heart-healthy oils, such as olive, canola, soybean, or sunflower oil.  Meal planning   Eat a balanced diet that includes:  ? 5 or more servings of fruits and vegetables each day. At each meal, try to fill half of your plate with fruits and vegetables.  ? Up  to 6-8 servings of whole grains each day.  ? Less than 6 oz of lean meat, poultry, or fish each day. A 3-oz serving of meat is about the same size as a deck of cards. One egg equals 1 oz.  ? 2 servings of low-fat dairy each day.  ? A serving of nuts, seeds, or beans 5 times each week.  ? Heart-healthy fats. Healthy fats called Omega-3 fatty acids are found in foods such as flaxseeds and coldwater fish, like sardines, salmon, and mackerel.   Limit how much you eat of the following:  ?   Canned or prepackaged foods.  ? Food that is high in trans fat, such as fried foods.  ? Food that is high in saturated fat, such as fatty meat.  ? Sweets, desserts, sugary drinks, and other foods with added sugar.  ? Full-fat dairy products.   Do not salt foods before eating.   Try to eat at least 2 vegetarian meals each week.   Eat more home-cooked food and less restaurant, buffet, and fast food.   When eating at a restaurant, ask that your food be prepared with less salt or no salt, if possible.  What foods are recommended?  The items listed may not be a complete list. Talk with your dietitian about what dietary choices are best for you.  Grains  Whole-grain or whole-wheat bread. Whole-grain or whole-wheat pasta. Brown rice. Oatmeal. Quinoa. Bulgur. Whole-grain and low-sodium cereals. Pita bread. Low-fat, low-sodium crackers. Whole-wheat flour tortillas.  Vegetables  Fresh or frozen vegetables (raw, steamed, roasted, or grilled). Low-sodium or reduced-sodium tomato and vegetable juice. Low-sodium or reduced-sodium tomato sauce and tomato paste. Low-sodium or reduced-sodium canned vegetables.  Fruits  All fresh, dried, or frozen fruit. Canned fruit in natural juice (without added sugar).  Meat and other protein foods  Skinless chicken or turkey. Ground chicken or turkey. Pork with fat trimmed off. Fish and seafood. Egg whites. Dried beans, peas, or lentils. Unsalted nuts, nut butters, and seeds. Unsalted canned beans. Lean cuts of  beef with fat trimmed off. Low-sodium, lean deli meat.  Dairy  Low-fat (1%) or fat-free (skim) milk. Fat-free, low-fat, or reduced-fat cheeses. Nonfat, low-sodium ricotta or cottage cheese. Low-fat or nonfat yogurt. Low-fat, low-sodium cheese.  Fats and oils  Soft margarine without trans fats. Vegetable oil. Low-fat, reduced-fat, or light mayonnaise and salad dressings (reduced-sodium). Canola, safflower, olive, soybean, and sunflower oils. Avocado.  Seasoning and other foods  Herbs. Spices. Seasoning mixes without salt. Unsalted popcorn and pretzels. Fat-free sweets.  What foods are not recommended?  The items listed may not be a complete list. Talk with your dietitian about what dietary choices are best for you.  Grains  Baked goods made with fat, such as croissants, muffins, or some breads. Dry pasta or rice meal packs.  Vegetables  Creamed or fried vegetables. Vegetables in a cheese sauce. Regular canned vegetables (not low-sodium or reduced-sodium). Regular canned tomato sauce and paste (not low-sodium or reduced-sodium). Regular tomato and vegetable juice (not low-sodium or reduced-sodium). Pickles. Olives.  Fruits  Canned fruit in a light or heavy syrup. Fried fruit. Fruit in cream or butter sauce.  Meat and other protein foods  Fatty cuts of meat. Ribs. Fried meat. Bacon. Sausage. Bologna and other processed lunch meats. Salami. Fatback. Hotdogs. Bratwurst. Salted nuts and seeds. Canned beans with added salt. Canned or smoked fish. Whole eggs or egg yolks. Chicken or turkey with skin.  Dairy  Whole or 2% milk, cream, and half-and-half. Whole or full-fat cream cheese. Whole-fat or sweetened yogurt. Full-fat cheese. Nondairy creamers. Whipped toppings. Processed cheese and cheese spreads.  Fats and oils  Butter. Stick margarine. Lard. Shortening. Ghee. Bacon fat. Tropical oils, such as coconut, palm kernel, or palm oil.  Seasoning and other foods  Salted popcorn and pretzels. Onion salt, garlic salt, seasoned  salt, table salt, and sea salt. Worcestershire sauce. Tartar sauce. Barbecue sauce. Teriyaki sauce. Soy sauce, including reduced-sodium. Steak sauce. Canned and packaged gravies. Fish sauce. Oyster sauce. Cocktail sauce. Horseradish that you find on the shelf. Ketchup. Mustard. Meat flavorings and tenderizers.   Bouillon cubes. Hot sauce and Tabasco sauce. Premade or packaged marinades. Premade or packaged taco seasonings. Relishes. Regular salad dressings.  Where to find more information:   National Heart, Lung, and Blood Institute: www.nhlbi.nih.gov   American Heart Association: www.heart.org  Summary   The DASH eating plan is a healthy eating plan that has been shown to reduce high blood pressure (hypertension). It may also reduce your risk for type 2 diabetes, heart disease, and stroke.   With the DASH eating plan, you should limit salt (sodium) intake to 2,300 mg a day. If you have hypertension, you may need to reduce your sodium intake to 1,500 mg a day.   When on the DASH eating plan, aim to eat more fresh fruits and vegetables, whole grains, lean proteins, low-fat dairy, and heart-healthy fats.   Work with your health care provider or diet and nutrition specialist (dietitian) to adjust your eating plan to your individual calorie needs.  This information is not intended to replace advice given to you by your health care provider. Make sure you discuss any questions you have with your health care provider.  Document Released: 07/03/2011 Document Revised: 07/07/2016 Document Reviewed: 07/07/2016  Elsevier Interactive Patient Education  2019 [employer] Inc[employer]

## 2018-09-21 NOTE — Progress Notes (Signed)
Patient will be having cataract surgery per Dr. Dub Mikes. Patients BP is not under control risking her of not having the surgery. Doctor recommended patient discuss clonidine patches. Patient states she has no allergen to clonidine. It is when she was increased from 0.1mg  3 months later to two 0.2mg  BID. Patient complained of palpitations and tachycardia.

## 2018-09-23 ENCOUNTER — Telehealth: Payer: Self-pay | Admitting: *Deleted

## 2018-09-23 NOTE — Telephone Encounter (Signed)
Medical Assistant left message on patient's home and cell voicemail. Voicemail states to give a call back to [place] with [facility] at [phone]. Patient advised to STOP clonidine all together and see if discomfort subsides over the next 5 days. If pain is persistent patient needs to report to the ED asap.

## 2018-09-27 ENCOUNTER — Ambulatory Visit: Payer: Self-pay | Attending: Family Medicine | Admitting: Pharmacist

## 2018-09-27 ENCOUNTER — Telehealth: Payer: Self-pay | Admitting: Family Medicine

## 2018-09-27 VITALS — BP 203/73 | HR 64

## 2018-09-27 DIAGNOSIS — H2513 Age-related nuclear cataract, bilateral: Secondary | ICD-10-CM | POA: Insufficient documentation

## 2018-09-27 DIAGNOSIS — I10 Essential (primary) hypertension: Secondary | ICD-10-CM

## 2018-09-27 MED ORDER — SPIRONOLACTONE 25 MG PO TABS: 25.0000 mg | ORAL_TABLET | Freq: Every day | ORAL | 0 refills | Status: DC

## 2018-09-27 MED FILL — SPIRONOLACTONE 25 MG TABLET: 25 | 30 days supply | Qty: 30 | Fill #0

## 2018-09-27 NOTE — Progress Notes (Signed)
   S:    PCP Dr. Chapman Fitch   Patient arrives in poor spirits.  Presents to the clinic for hypertension management. Patient was referred by Dr. Chapman Fitch on 09/21/18. BP at that visit 162/102. Dr. Chapman Fitch started clonidine 0.1 mg BID.   Of note, pt has extensive hx of intolerances to multiple classes of anti-hypertensive medications. Today, she reports stopping her clonidine because of a "pounding heartbeat". Denies any current chest pain or dyspnea. Denies any HA, blurred vision.   Patient denies adherence with medications.  Current BP Medications include:   - Enalapril 10 mg BID - Clonidine 0.1 mg BID (stopped 2 days ago) - Metoprolol tartrate 25 mg BID  Antihypertensives tried in the past include:  - Amlodipine (swelling) - Carvedilol (excessive drowsiness, dizziness and palpitations) - Hydralazine (palpitations) - lisinopril (palpitatons) - Lisinopril-HCTZ (flushing and insomnia) - Valsartan (palpitations)  Dietary habits include: limits salt, drinks 1 cup of green tea daily Exercise habits include: mobility is limited - she is able to ambulate but does not exercise Family / Social history:  - FHx: heart disease (mother) - Tobacco: never smoker - Alcohol: no alcohol  Home BP readings:  - SBPs: 148-187 - DBPs: 77 - 91 - Home pulse rate: 49 - 70   O:  L arm after 5 minutes rest: 203/73, HR 64  Last 3 Office BP readings: BP Readings from Last 3 Encounters:  09/27/18 (!) 203/73  09/21/18 (!) 162/102  08/18/18 (!) 177/90    BMET    Component Value Date/Time   NA 140 08/16/2018 1416   NA 143 12/31/2017 1140   K 4.1 08/16/2018 1416   CL 106 08/16/2018 1416   CO2 26 08/16/2018 1416   GLUCOSE 110 (H) 08/16/2018 1416   BUN 9 08/16/2018 1416   BUN 13 12/31/2017 1140   CREATININE 0.97 08/16/2018 1416   CREATININE 0.90 10/02/2015 1204   CALCIUM 9.2 08/16/2018 1416   GFRNONAA 58 (L) 08/16/2018 1416   GFRAA >60 08/16/2018 1416   Renal function: CrCl cannot be calculated  (Patient's most recent lab result is older than the maximum 21 days allowed.).  Clinical ASCVD: No  The ASCVD Risk score Mikey Bussing DC Jr., et al., 2013) failed to calculate for the following reasons:   The valid systolic blood pressure range is 90 to 200 mmHg  A/P: Hypertension longstanding currently uncontrolled on current medications. BP Goal = 130/80 mmHg. Patient is not adherent with current medications. Question the validity of her home meter given persistent BP elevation in clinic. She did not bring in her cuff as instructed. Discussed with PCP today who will not see the patient - she agrees with my plan of adding spironolactone. Unfortunately, we are running out of tx options with the degree of medication intolerability that the patient experiences.  -Started spironolactone 25 mg daily -Continued enalapril and metoprolol  -F/u labs ordered - BMP, future in 1 week -Counseled on lifestyle modifications for blood pressure control including reduced dietary sodium, increased exercise, adequate sleep  Results reviewed and written information provided. Total time in face-to-face counseling 15 minutes.   F/U Clinic Visit in 1 week for labs only.  Benard Halsted, PharmD, [place] [phone]

## 2018-09-27 NOTE — Telephone Encounter (Signed)
Patient was seen by CPP in office today and medication  was changed. Patient stopped clonidine days prior.

## 2018-09-27 NOTE — Telephone Encounter (Signed)
Patient states she is feeling pain and believes it is the clondine that is causing her pain and would like to speak to someone in regards to the issue.   Advised to go to the ED or UC. Please follow up.

## 2018-09-27 NOTE — Patient Instructions (Signed)
Thank you for coming to see [place] today.   Blood pressure today is very high.  Stop clonidine.   Continue metoprolol and enalapril.   Start spironolactone - 1 tablet once daily.  Limiting salt and caffeine, as well as exercising as able for at least 30 minutes for 5 days out of the week, can also help you lower your blood pressure.  Take your blood pressure at home if you are able. Please write down these numbers and bring them to your visits.  If you have any questions about medications, please call me [phone].  Lurena Joiner

## 2018-09-28 ENCOUNTER — Encounter: Payer: Self-pay | Admitting: Pharmacist

## 2018-10-01 ENCOUNTER — Ambulatory Visit: Payer: Self-pay | Admitting: Pharmacist

## 2018-10-04 ENCOUNTER — Ambulatory Visit: Payer: Self-pay | Attending: Family Medicine

## 2018-10-04 DIAGNOSIS — I10 Essential (primary) hypertension: Secondary | ICD-10-CM

## 2018-10-04 MED FILL — LUMIGAN 0.01% EYE DROPS: 0.01 | 18 days supply | Qty: 3 | Fill #1

## 2018-10-05 LAB — CMP14+EGFR
ALT: 10 IU/L (ref 0–32)
AST: 16 IU/L (ref 0–40)
Albumin/Globulin Ratio: 1.7 (ref 1.2–2.2)
Albumin: 4.7 g/dL (ref 3.7–4.7)
Alkaline Phosphatase: 53 IU/L (ref 39–117)
BUN/Creatinine Ratio: 13 (ref 12–28)
BUN: 14 mg/dL (ref 8–27)
Bilirubin Total: 0.5 mg/dL (ref 0.0–1.2)
CO2: 23 mmol/L (ref 20–29)
Calcium: 9.9 mg/dL (ref 8.7–10.3)
Chloride: 103 mmol/L (ref 96–106)
Creatinine, Ser: 1.06 mg/dL — ABNORMAL HIGH (ref 0.57–1.00)
GFR calc Af Amer: 60 mL/min/1.73
GFR calc non Af Amer: 52 mL/min/1.73 — ABNORMAL LOW
Globulin, Total: 2.8 g/dL (ref 1.5–4.5)
Glucose: 99 mg/dL (ref 65–99)
Potassium: 4.4 mmol/L (ref 3.5–5.2)
Sodium: 143 mmol/L (ref 134–144)
Total Protein: 7.5 g/dL (ref 6.0–8.5)

## 2018-10-06 ENCOUNTER — Telehealth: Payer: Self-pay | Admitting: Family Medicine

## 2018-10-06 ENCOUNTER — Telehealth: Payer: Self-pay | Admitting: Emergency Medicine

## 2018-10-06 ENCOUNTER — Telehealth: Payer: Self-pay | Admitting: *Deleted

## 2018-10-06 MED ORDER — ISOSORBIDE MONONITRATE ER 60 MG PO TB24: 60.0000 mg | ORAL_TABLET | Freq: Every day | ORAL | 0 refills | Status: DC

## 2018-10-06 NOTE — Telephone Encounter (Signed)
Patient and her daughter arrived to the Clinic with a need for advise.  Patients daughter came to the window and stated she wished to speak with the Nurse.  Patients daughter was taken to RM 1. Nurse introduced himself and asked what the problem was.  The patient had scheduled procedures today but the orthopedist declined due to the patients high blood pressure. Patient consistently has systolic pressures over 235 according to the handwritten notes on her paperwork from Orthopedic surgery.  Blood pressure here in clinic was 201/93.  Patient' daughter states she is non compliant with her medications.    Patient was called back to Room 1. Blood pressure was taken and  was 201/93. Patient asked if she was compliant with her medications.  At first patient stated that she was, then recanted and said she didn't take them because she got heart palpitations.  Patient advised that her best course of action was to go to the Emergency Room. Patient and patient's daughter were not axious to go due to cost and no insurance.  Patient asked if she had anxiety or worried a lot.  Patient endorsed anxiety and then asked Nurse for a three day supply of diazepam.  Nurse stated that her would forward request to DR. Fulp.  Patient was also counseled on the probable need for a behavioral health consult. Patient acknowledged understanding of given information.  Patient again was advised that going to the Emergency room was the best course of action at the present time. Patient acknowledge understanding of advise

## 2018-10-06 NOTE — Telephone Encounter (Signed)
Patient called to inform her that Dr Margarita Rana, the clinic director had been contacted about the patient's case.  Dr Margarita Rana prescribed a new medication IMDUR, for her palpitations and a new appointment was made for Monday.    Patient stated that when she got home her palpitations stop and she was getting a blood pressures of 619'J-093'O systolic and 67-12'W at home.  Patient was advised if she got any pressure in the systolic range of 580 or over that she needs to go to the Emergency Room.  Patient acknowledge understanding about new medication, new appointment, and reasons to go to the Emergency Room.

## 2018-10-06 NOTE — Telephone Encounter (Signed)
Medical Assistant left message on patient's home and cell voicemail. Voicemail states to give a call back to [place] with [facility] at [phone]. Patient is aware of kidney function being slightly elevated and needing to remain hydrated!

## 2018-10-06 NOTE — Telephone Encounter (Signed)
Patients daughter came into the clinic to "vent."  Her mother, the patient had just gone over to the surgery center for a procedure.  Patients blood pressures at Orthopedics was consistently over 160 systolic.  Orthopedics decline to do the procedure and stated to follow up with her PCP.  Daughter states patient is non compliant with medication due to what she states are palpitations.    At this point I brought patient into triage room and asked if she was compliant with her medications which she said yes.  When asked if she was taking her spirolactone she said no.  Patient also no taking clonidine either.  Patient did get a blood pressure in clinic which was  201/92.  Patient advised the best course of action was to go to ED for further evaluation.  Patient was reluctant to do this and asked for three day supply of diazepam.  Nursing stated that he could not supply that but this note would be routed to Dr. Chapman Fitch.  Patient was talked to about perhaps her anxiety was effecting her medication modality.  Patient stated she was anxious and worried a lot.  Patient endorse insomnia.  Patient again advised ED evaluation was the best course of action.

## 2018-10-06 NOTE — Telephone Encounter (Signed)
Patient wanted to talk about high BP

## 2018-10-06 NOTE — Telephone Encounter (Signed)
-----   Message from Antony Blackbird, MD sent at 10/05/2018  9:42 PM EDT ----- Creatinine is mildly increased at 1.06 (normal 0.57-1.00). Remain hydrated

## 2018-10-06 NOTE — Telephone Encounter (Signed)
Reviewed Nursing note with persistently elevated systolic BP in the 423 range. I have sent a rx for Isosorbide to her pharmacy which she will need to commence and see her PCP for a reassessment. Please place on Dr Fulp's schedule for f/u in the next few days. Thanks

## 2018-10-06 NOTE — Telephone Encounter (Signed)
I have addressed this and she will be returning for a follow up with you next week.

## 2018-10-06 NOTE — Telephone Encounter (Signed)
As I am out of the office, on PAL, this week, in this situation and future situations when the PCP is not available, this patient should have been discussed with Dr. Margarita Rana or 1 of the other providers in the office who may have been willing to see the patient in the office for treatment of her hypertension and anxiety as patient expressed that she likely would not follow-up at the emergency department.  Please discussed the patient with 1 of the other providers and see if they may be willing to see her this week especially if patient does not go to the emergency department as directed.

## 2018-10-08 NOTE — Telephone Encounter (Signed)
Call placed to patient. LCSWA introduced self and explained role at [facility]. Pt agreed to meet with LCSWA after PCP appointment. No additional concerns noted.

## 2018-10-11 ENCOUNTER — Ambulatory Visit: Payer: Self-pay | Attending: Family Medicine | Admitting: Licensed Clinical Social Worker

## 2018-10-11 ENCOUNTER — Other Ambulatory Visit: Payer: Self-pay

## 2018-10-11 ENCOUNTER — Ambulatory Visit: Payer: Self-pay | Attending: Family Medicine | Admitting: Family Medicine

## 2018-10-11 ENCOUNTER — Encounter: Payer: Self-pay | Admitting: Family Medicine

## 2018-10-11 VITALS — BP 197/85 | HR 62 | Temp 97.6°F | Resp 18 | Ht 67.0 in | Wt 164.0 lb

## 2018-10-11 DIAGNOSIS — R4589 Other symptoms and signs involving emotional state: Secondary | ICD-10-CM

## 2018-10-11 DIAGNOSIS — I10 Essential (primary) hypertension: Secondary | ICD-10-CM

## 2018-10-11 DIAGNOSIS — F418 Other specified anxiety disorders: Secondary | ICD-10-CM

## 2018-10-11 DIAGNOSIS — F419 Anxiety disorder, unspecified: Secondary | ICD-10-CM

## 2018-10-11 DIAGNOSIS — R350 Frequency of micturition: Secondary | ICD-10-CM

## 2018-10-11 DIAGNOSIS — R002 Palpitations: Secondary | ICD-10-CM

## 2018-10-11 DIAGNOSIS — Z79899 Other long term (current) drug therapy: Secondary | ICD-10-CM

## 2018-10-11 LAB — POCT URINALYSIS DIP (CLINITEK)
Bilirubin, UA: NEGATIVE
Blood, UA: NEGATIVE
Glucose, UA: NEGATIVE mg/dL
Ketones, POC UA: NEGATIVE mg/dL
Leukocytes, UA: NEGATIVE
Nitrite, UA: NEGATIVE
Spec Grav, UA: 1.015
Urobilinogen, UA: 0.2 U/dL
pH, UA: 8.5 — AB

## 2018-10-11 LAB — POCT GLYCOSYLATED HEMOGLOBIN (HGB A1C): Hemoglobin A1C: 5.2 % (ref 4.0–5.6)

## 2018-10-11 MED ORDER — ALPRAZOLAM 0.25 MG PO TABS: ORAL_TABLET | ORAL | 0 refills | Status: DC

## 2018-10-11 MED ORDER — ENALAPRIL MALEATE 20 MG PO TABS: 20.0000 mg | ORAL_TABLET | Freq: Two times a day (BID) | ORAL | 1 refills | Status: DC

## 2018-10-11 MED FILL — ENALAPRIL MALEATE 20 MG TAB: 20 | 90 days supply | Qty: 180 | Fill #0

## 2018-10-11 NOTE — Patient Instructions (Addendum)
Please stop the spironolactone 25 mg. Please continue metoprolol and enalapril. Your dose of enalapril is being increased to 20 mg twice per day. Please return in 1 week for a nurse visit or visit with pharmacist to recheck your blood pressure.
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
## 2018-10-12 ENCOUNTER — Telehealth: Payer: Self-pay | Admitting: *Deleted

## 2018-10-12 LAB — BASIC METABOLIC PANEL WITH GFR
BUN/Creatinine Ratio: 17 (ref 12–28)
BUN: 16 mg/dL (ref 8–27)
CO2: 23 mmol/L (ref 20–29)
Calcium: 9.5 mg/dL (ref 8.7–10.3)
Chloride: 101 mmol/L (ref 96–106)
Creatinine, Ser: 0.94 mg/dL (ref 0.57–1.00)
GFR calc Af Amer: 69 mL/min/1.73
GFR calc non Af Amer: 60 mL/min/1.73
Glucose: 101 mg/dL — ABNORMAL HIGH (ref 65–99)
Potassium: 4.7 mmol/L (ref 3.5–5.2)
Sodium: 141 mmol/L (ref 134–144)

## 2018-10-12 NOTE — Telephone Encounter (Signed)
-----   Message from Antony Blackbird, MD sent at 10/12/2018  4:48 PM EDT ----- Normal BMP

## 2018-10-12 NOTE — Telephone Encounter (Signed)
Patient verified DOB Patient is aware of lab being normal. Patient has appointment with cardiology.

## 2018-10-14 ENCOUNTER — Ambulatory Visit: Payer: Self-pay | Admitting: Family Medicine

## 2018-10-18 ENCOUNTER — Ambulatory Visit: Payer: Self-pay

## 2018-10-18 NOTE — BH Specialist Note (Signed)
Integrated Behavioral Health Initial Visit  MRN: [id number] Name: Theresa [name]  Number of [place] Clinician visits:: 1/6 Session Start time: 11:40 AM  Session End time: 12:10 PM Total time: 30 minutes  Type of Service: [place] Interpretor:No. Interpretor Name and Language: N/A   Warm Hand Off Completed.       SUBJECTIVE: Theresa [name] is a 73 y.o. female accompanied by self Patient was referred by Dr. Chapman Fitch for anxiety. Patient reports the following symptoms/concerns: Pt reports difficulty managing hypertension and anxiety. She states hx of heart palpitations and poor appetite Duration of problem: 1993; Severity of problem: moderate  OBJECTIVE: Mood: Anxious and Affect: Appropriate Risk of harm to self or others: No plan to harm self or others  LIFE CONTEXT: Family and Social: Pt has family support School/Work: No financial concerns Self-Care: Pt is not interested in medication management or psychotherapy. She drinks tea and rests to cope with stressors Life Changes: Pt has ongoing health conditions and hx of trauma. She has difficulty managing chronic health conditions that has negatively impacted her ability to follow through with scheduled surgeries  GOALS ADDRESSED: Patient will: 1. Reduce symptoms of: anxiety 2. Increase knowledge and/or ability of: coping skills and healthy habits  3. Demonstrate ability to: Increase healthy adjustment to current life circumstances and Improve medication compliance  INTERVENTIONS: Interventions utilized: Solution-Focused Strategies, Supportive Counseling and Psychoeducation and/or Health Education  Standardized Assessments completed: GAD-7 and PHQ 2&9  ASSESSMENT: Patient currently experiencing anxiety. Pt has ongoing health conditions and hx of trauma. She has difficulty managing chronic health conditions that has negatively impacted her ability to follow through with  scheduled surgeries. She complains of heart palpitations and poor appetite.   Patient may benefit from medication management and psychotherapy. Pt is uninterested in either; however, has agreed to take alprazolam prior to medical procedures. Healthy coping skills, in addition, to the importance of medication compliance, were discussed to assist in decrease and/or management of symptoms.   PLAN: 1. Follow up with behavioral health clinician on : Pt was encouraged to contact LCSWA if symptoms worsen or fail to improve to schedule behavioral appointments at [facility]. 2. Behavioral recommendations: LCSWA recommends that pt apply healthy coping skills discussed. Pt is encouraged to schedule follow up appointment with LCSWA 3. Referral(s): [place] (In Clinic) 4. "From scale of 1-10, how likely are you to follow plan?":   Rebekah Chesterfield, LCSW 10/18/2018 9:46 AM

## 2018-10-27 ENCOUNTER — Ambulatory Visit: Payer: Self-pay | Admitting: Cardiology

## 2018-10-27 ENCOUNTER — Telehealth: Payer: Self-pay | Admitting: *Deleted

## 2018-10-27 NOTE — Telephone Encounter (Signed)
   Cardiac Questionnaire:    Since your last visit or hospitalization:    1. Have you been having new or worsening chest pain? No   2. Have you been having new or worsening shortness of breath? No 3. Have you been having new or worsening leg swelling, wt gain, or increase in abdominal girth (pants fitting more tightly)? No   4. Have you had any passing out spells? No    *A YES to any of these questions would result in the appointment being kept. *If all the answers to these questions are NO, we should indicate that given the current situation regarding the worldwide coronarvirus pandemic, at the recommendation of the CDC, we are looking to limit gatherings in our waiting area, and thus will reschedule their appointment beyond four weeks from today.   _____________   WFUXN-[id number] Pre-Screening Questions:  . Do you currently have a fever? NO (yes = cancel and refer to pcp for e-visit) . Have you recently travelled on a cruise, internationally, or to [place], [place], [place], [place], [place], or [place], [place] [employer]) ? NO (yes = cancel, stay home, monitor symptoms, and contact pcp or initiate e-visit if symptoms develop) . Have you been in contact with someone that is currently pending confirmation of Covid19 testing or has been confirmed to have the [place] virus?  NO (yes = cancel, stay home, away from tested individual, monitor symptoms, and contact pcp or initiate e-visit if symptoms develop) . Are you currently experiencing fatigue or cough? NO (yes = pt should be prepared to have a mask placed at the time of their visit).  Patient says that she thinks the dosage of her Enalapril is too strong for her. Also her blood pressure has been going up and down. She felt some palpitations last night. I told patient that I had documented her symptoms and that I will reach out to Dr. Martinique and give her a call back.

## 2018-10-27 NOTE — Telephone Encounter (Signed)
Follow up   Patient has questions about the office visit on tomorrow. Please call the patient.

## 2018-10-28 ENCOUNTER — Encounter: Payer: Self-pay | Admitting: Cardiology

## 2018-10-28 ENCOUNTER — Ambulatory Visit: Payer: Self-pay | Admitting: Cardiology

## 2018-10-28 ENCOUNTER — Other Ambulatory Visit: Payer: Self-pay

## 2018-10-28 ENCOUNTER — Ambulatory Visit (INDEPENDENT_AMBULATORY_CARE_PROVIDER_SITE_OTHER): Payer: Self-pay | Admitting: Cardiology

## 2018-10-28 VITALS — BP 220/100 | HR 90 | Ht 67.0 in | Wt 170.0 lb

## 2018-10-28 DIAGNOSIS — R002 Palpitations: Secondary | ICD-10-CM

## 2018-10-28 DIAGNOSIS — E7800 Pure hypercholesterolemia, unspecified: Secondary | ICD-10-CM

## 2018-10-28 DIAGNOSIS — I10 Essential (primary) hypertension: Secondary | ICD-10-CM

## 2018-10-28 MED ORDER — ROSUVASTATIN CALCIUM 5 MG PO TABS: 5.0000 mg | ORAL_TABLET | Freq: Every day | ORAL | 3 refills | Status: DC

## 2018-10-28 MED ORDER — METOPROLOL TARTRATE 50 MG PO TABS: 50.0000 mg | ORAL_TABLET | Freq: Two times a day (BID) | ORAL | 3 refills | Status: DC

## 2018-10-28 MED FILL — ROSUVASTATIN CALCIUM 5 MG T: 5 | 90 days supply | Qty: 90 | Fill #0

## 2018-10-28 MED FILL — ?METOPROLOL 50 MG TABLET: 50 | 90 days supply | Qty: 180 | Fill #0

## 2018-10-28 NOTE — Patient Instructions (Addendum)
We are going to increase the metoprolol to 50 mg twice a day  Add Crestor 5 mg daily for chlolesterol  Continue your other therapy  Report back to me by phone or Mychart in 2-3 weeks with readings of blood pressure and pulse rate.  We will follow up in 3 months

## 2018-10-28 NOTE — Progress Notes (Signed)
Cardiology Office Note   Date:  10/28/2018   ID:  Theresa [name], DOB 1945-08-25, MRN [id number]  PCP:  Antony Blackbird, MD  Cardiologist:   Peter Martinique, MD   Chief Complaint  Patient presents with  . Follow-up    BP going up and down. Patient has concerns about her Enalapril.  . Palpitations      History of Present Illness: Theresa [name] is a 73 y.o. female who is seen at the request of Dr Chapman Fitch for evaluation of palpitations and labile high blood pressure. She has a long history of poorly controlled HTN. Seen with her daughter today who is a Insurance account manager. Patient brings BP readings ranging from 732-202 systolic and 54-270 diastolic. A number of medications have been tried but she usually stops taking them because she states they cause her heart to race. This includes clonidine, hydralazine and even Coreg. She did have swelling on amlodipine. She reports recently being started on spironolactone but this made her urinate frequently and she thought she was dehydrated. Pulse rate is typically 90. She denies edema, SOB, HA, dizziness or syncope. Only had chest pain when on clonidine.    Past Medical History:  Diagnosis Date  . Gastric ulcer   . Hyperlipidemia   . Hypertension   . River blindness   . Sinusitis     Past Surgical History:  Procedure Laterality Date  . APPENDECTOMY    . BREAST LUMPECTOMY       Current Outpatient Medications  Medication Sig Dispense Refill  . acetaminophen (TYLENOL) 500 MG tablet Take 500 mg by mouth every 6 (six) hours as needed for mild pain or moderate pain.    Marland Kitchen ALPRAZolam (XANAX) 0.25 MG tablet Take 1 or 2 pill about 30 minutes prior to procedures 10 tablet 0  . enalapril (VASOTEC) 20 MG tablet Take 1 tablet (20 mg total) by mouth 2 (two) times daily. To lower blood pressure 180 tablet 1  . ezetimibe (ZETIA) 10 MG tablet Take 1 tablet (10 mg total) by mouth daily. 90 tablet 3  . folic acid (FOLVITE) 623 MCG tablet Take by mouth.    .  Multiple Vitamin (MULTIVITAMIN) tablet Take 1 tablet by mouth daily.     No current facility-administered medications for this visit.     Allergies:   Amlodipine; Coreg [carvedilol]; Shellfish allergy; Trazodone and nefazodone; Hydralazine hcl; Augmentin [amoxicillin-pot clavulanate]; Clonidine derivatives; Lisinopril; and Lisinopril-hydrochlorothiazide    Social History:  The patient  reports that she has never smoked. She has never used smokeless tobacco. She reports that she does not drink alcohol or use drugs.   Family History:  The patient's family history includes Cancer in her brother; Heart disease in her mother; Hypertension in her mother.    ROS:  Please see the history of present illness.   Otherwise, review of systems are positive for none.  She states she was on a statin but she quit taking because it caused her to have varicose veins. All other systems are reviewed and negative.    PHYSICAL EXAM: VS:  BP (!) 220/100 (BP Location: Right Arm, Patient Position: Sitting, Cuff Size: Normal)   Pulse 90   Ht 5\' 7"  (1.702 m)   Wt 170 lb (77.1 kg)   BMI 26.63 kg/m  , BMI Body mass index is 26.63 kg/m. GEN: Well nourished, well developed elderly BF, in no acute distress  HEENT: normal  Neck: no JVD, carotid bruits, or masses Cardiac: RRR; no  murmurs, rubs, or gallops,no edema  Respiratory:  clear to auscultation bilaterally, normal work of breathing GI: soft, nontender, nondistended, + BS MS: no deformity or atrophy  Skin: warm and dry, no rash Neuro:  Strength and sensation are intact Psych: euthymic mood, full affect   EKG:  EKG is ordered today. The ekg ordered today demonstrates NSR rate 90. LVH. I have personally reviewed and interpreted this study.    Recent Labs: 08/16/2018: Hemoglobin 12.8; Platelets 105 10/04/2018: ALT 10 10/11/2018: BUN 16; Creatinine, Ser 0.94; Potassium 4.7; Sodium 141    Lipid Panel    Component Value Date/Time   CHOL 243 (H) 12/31/2017  1140   TRIG 64 12/31/2017 1140   HDL 84 12/31/2017 1140   CHOLHDL 2.9 12/31/2017 1140   CHOLHDL 3.4 06/11/2016 0941   VLDL 13 06/11/2016 0941   LDLCALC 146 (H) 12/31/2017 1140      Wt Readings from Last 3 Encounters:  10/28/18 170 lb (77.1 kg)  10/11/18 164 lb (74.4 kg)  09/21/18 168 lb (76.2 kg)      Other studies Reviewed: Additional studies/ records that were reviewed today include:   Renal US 11/01/15: Summary: Findings suggest 1-59% renal artery stenosis. There is evidence of elevated resistive indices in bilateral intrarenal arteries. Incidental finding: there is evidence of multiple simple cysts in the left kidney, largest measuring 1.14cm.    ASSESSMENT AND PLAN:  1. Severe, uncontrolled HTN. Has stopped multiple medication due to perceived intolerances. Recommend sodium restriction. Today will increase metoprolol to 50 mg bid since she is not effectively beta blocked and this may help with perceived palpitations. Continue enalapril 20 mg bid. I anticipate she will need additional medication to control her BP but will approach in a gradual titration to minimize side effects and to get patient buy in. Alternative would be to try switching enalapril to an ARB or add Cardizem. She is to report back to me in 2-3 weeks with BP and HR readings and we can adjust further. I will see in 3 months 2. Palpitations. Likely exacerbated by severe HTN. Will monitor response to increased beta blocker. If palpitations persist despite good beta blockade we can have her wear a monitor. 3. Hypercholesterolemia. Recommend adding Crestor 5 mg daily. Informed  Patient this does not cause varicose veins 4. Aortic atherosclerosis noted on prior renal US. Continue to modifiy risk factors.    Current medicines are reviewed at length with the patient today.  The patient does not have concerns regarding medicines.  The following changes have been made:  Add crestor 5 mg daily. Increase metoprolol to  50 mg bid.  Labs/ tests ordered today include:  No orders of the defined types were placed in this encounter.    Disposition:   FU with me in 3 months  Signed, Peter Martinique, MD  10/28/2018 10:06 AM    [facility] [address], [place], [place], [address] Phone [phone], Fax [phone]

## 2018-11-18 ENCOUNTER — Ambulatory Visit: Payer: Self-pay | Admitting: Family Medicine

## 2018-11-19 ENCOUNTER — Telehealth: Payer: Self-pay | Admitting: Cardiology

## 2018-11-19 NOTE — Telephone Encounter (Signed)
Per pt these readings are after meds are taken and pt states palpitations are almost all gone Will forward message to Dr Martinique for review and recommendations ./cy

## 2018-11-19 NOTE — Telephone Encounter (Signed)
  Pt c/o BP issue: STAT if pt c/o blurred vision, one-sided weakness or slurred speech  1. What are your last 5 BP readings?  4/4 187/93 P 55, 176/100 P 56 4/7 204/95 P 66, 184/95 P 61 4/11 166/116 P 62, 184/104 P 63 4/15 185/106 P 61, 167/101 P 65 4/16 204/104 P 55, 183/97 P 47 4/17 192/95 P 61, 181/103 P 53 4/18 203/109 P 52, 183/97 P 47 4/19 187/88 P 68, 162/93 P 55 4/20 170/91 P 61, 152/88 P 67 4/21 170/85 P 56, 153/88 P 67 4/22 215/92 P 65, 207/101 P 62 4/23 184/102 P 59, 170/85 P 66  2. Are you having any other symptoms (ex. Dizziness, headache, blurred vision, passed out)? headache  3. What is your BP issue? Patient was asked to track her pressure and call with totals

## 2018-11-22 MED ORDER — DILTIAZEM HCL ER COATED BEADS 240 MG PO CP24: 240.0000 mg | ORAL_CAPSULE | Freq: Every day | ORAL | 3 refills | Status: DC

## 2018-11-22 MED FILL — DILTIAZEM 24HR CD 240 MG CA: 240 | 30 days supply | Qty: 30 | Fill #0

## 2018-11-22 NOTE — Telephone Encounter (Signed)
Returned call to patient Dr.Jordan's advice given.Cardizem 240 mg prescription sent to pharmacy.Advised to call back in 2 to 3 weeks to report B/P readings.She stated she is having trouble sleeping.She wanted to know if ok to take alprazolam.Advised ok to take if needed.

## 2018-11-22 NOTE — Telephone Encounter (Signed)
She is at least well beta blocked now. Continue metoprolol and lisinopril. Recommend adding Cardizem CD 240 mg daily for BP. Report back in 2-3 weeks with readings.  Abir Craine Martinique MD, [facility]

## 2018-11-29 ENCOUNTER — Telehealth: Payer: Self-pay | Admitting: Cardiology

## 2018-11-29 NOTE — Telephone Encounter (Signed)
New message:     Patient calling concering some medication change and states she thinks now her BP is low. Please call patient back.

## 2018-11-29 NOTE — Telephone Encounter (Signed)
Spoke with the ot. Pt sts that her BP has not been consistent since her recent medication change. She has been checking her BP and HR 2-3 times daily. She has maile a copy of the readings to Dr. Doug Sou attention. Pt sts that her HR has remained in the 40's since being started on Cardizem 240mg  daily. Pt provided readings for yesterday and today.  5/3 @11am  147/82 44bpm @9pm  147/88 44bpm  5/4 @10am  123/59 46bpm @3pm  106/62 46bpm Pt sts that she is currently asymptomatic. She does have some dizziness when her BP systolic is in the 741'O. Adv the pt to continue monitoring her BP twice daily. Adv the pt that I will fwd the update to Dr. Martinique and we will call back with his recommendation. She is to contact the office sooner if symptoms devleop

## 2018-11-30 ENCOUNTER — Telehealth: Payer: Self-pay | Admitting: Family Medicine

## 2018-11-30 MED ORDER — METOPROLOL TARTRATE 50 MG PO TABS: 25.0000 mg | ORAL_TABLET | Freq: Two times a day (BID) | ORAL | 3 refills | Status: DC

## 2018-11-30 NOTE — Telephone Encounter (Signed)
New Message   Called Theresa [name] to schedule bp check per Harle Battiest and Dr. Chapman Fitch but Theresa [name] states she needs to find transportation first before scheduling her appt. Theresa [name] will call back

## 2018-11-30 NOTE — Telephone Encounter (Signed)
Spoke with pt, aware of dr Doug Sou recommendations. She will call when refill is needed.

## 2018-11-30 NOTE — Telephone Encounter (Signed)
BP has improved but HR is slow. Continue Cardizem but reduce metoprolol back to 25 mg bid.   Peter Martinique MD, [facility]

## 2018-11-30 NOTE — Telephone Encounter (Signed)
Noted  

## 2018-12-08 NOTE — Telephone Encounter (Signed)
Called patient and patient stated that at this time she is not able to schedule a BP check due to transportation.

## 2018-12-21 MED FILL — DILTIAZEM 24HR CD 240 MG CA: 240 | 30 days supply | Qty: 30 | Fill #1

## 2018-12-21 MED FILL — LUMIGAN 0.01% EYE DROPS: 0.01 | 56 days supply | Qty: 8 | Fill #0

## 2018-12-23 ENCOUNTER — Telehealth: Payer: Self-pay | Admitting: Family Medicine

## 2018-12-23 NOTE — Telephone Encounter (Signed)
New Message   Pt dropped off letter. Letter placed in Fulp's box

## 2018-12-23 NOTE — Telephone Encounter (Signed)
Looked at letter in provider's box and it's a copy of patient's blood pressure reading from 11-26-18 until 12-22-18. Reading will be placed back in provider's in box.

## 2018-12-24 NOTE — Telephone Encounter (Signed)
Spoke with patient and informed her with what provider stated and she verbalized understanding. Per pt she also scheduled an appt with Dr. Chapman Fitch 01-13-19 to get a letter that states how much she have improved.

## 2018-12-24 NOTE — Telephone Encounter (Signed)
Just encourage patient to continue to follow-up with cardiology regarding her uncontrolled blood pressure

## 2018-12-31 ENCOUNTER — Telehealth: Payer: Self-pay | Admitting: Cardiology

## 2018-12-31 NOTE — Telephone Encounter (Signed)
  Patient is calling to see if we have received her BP readings and she would like to speak to the nurse. She if feeling tired, weak and has some headaches.

## 2018-12-31 NOTE — Telephone Encounter (Signed)
Returned call to patient.She stated she has been having weakness,headache,no appetite.She wanted to know if we received B/P readings.Advised we are not in office every day.Appointment scheduled with Jory Sims DNP 01/05/19 at 8:15 am.Advised to bring B/P readings and a list of all medications.

## 2019-01-03 ENCOUNTER — Telehealth: Payer: Self-pay

## 2019-01-03 MED ORDER — VALSARTAN 320 MG PO TABS: 320.0000 mg | ORAL_TABLET | Freq: Every day | ORAL | 6 refills | Status: DC

## 2019-01-03 MED FILL — VALSARTAN 320 MG TAB: 320 | 30 days supply | Qty: 30 | Fill #0

## 2019-01-03 NOTE — Telephone Encounter (Signed)
Spoke to patient Dr.Jordan reviewed B/P readings he advised to stop Enalapril and start Valsartan 320 mg daily.Advised to check B/P daily after she starts taking Valsartan.Advised to mail readings in 2 weeks.

## 2019-01-04 ENCOUNTER — Telehealth: Payer: Self-pay | Admitting: Cardiology

## 2019-01-04 NOTE — Telephone Encounter (Signed)
Spoke to patient Dr.Jordan's advice given.Advised to call back if she continues to feel bad.Advised to monitor B/P and call back if elevated.

## 2019-01-04 NOTE — Telephone Encounter (Signed)
Spoke with pt, since starting the diltiazem she reports feeling tired and no energy. She would like to change to something else, she does not like the way she feels. She reports she took the new bp medication today. Will forward to dr Martinique.

## 2019-01-04 NOTE — Telephone Encounter (Signed)
New Message    Pt c/o medication issue:  1. Name of Medication: Diltiazem 240mg    2. How are you currently taking this medication (dosage and times per day)? 1 capsule daily   3. Are you having a reaction (difficulty breathing--STAT)? n/a  4. What is your medication issue? Patient states she's been feeling tired and fatigued since taking medication and wants to know what she should do.

## 2019-01-04 NOTE — Telephone Encounter (Signed)
OK can hold Cardizem now and see how she responds to Valsartan  Peter Martinique MD, [facility]

## 2019-01-05 ENCOUNTER — Ambulatory Visit: Payer: Self-pay | Admitting: Adult Health

## 2019-01-13 ENCOUNTER — Ambulatory Visit: Payer: Self-pay | Admitting: Family Medicine

## 2019-01-13 ENCOUNTER — Other Ambulatory Visit: Payer: Self-pay

## 2019-01-13 ENCOUNTER — Ambulatory Visit (HOSPITAL_BASED_OUTPATIENT_CLINIC_OR_DEPARTMENT_OTHER): Payer: Self-pay | Admitting: Family Medicine

## 2019-01-13 DIAGNOSIS — Z5321 Procedure and treatment not carried out due to patient leaving prior to being seen by health care provider: Secondary | ICD-10-CM

## 2019-01-17 NOTE — Progress Notes (Signed)
Patient ID: Theresa [name], female   DOB: Jul 20, 1946, 73 y.o.   MRN: [id number]   Patient came into the office and spoke with the RMA stating that she needed a letter written by her provider giving consent for her to have eye surgery which is scheduled for Monday.  RMA contacted patient's eye specialist and patient is scheduled for follow-up on Monday but does not need a letter regarding her blood pressure or clearance.  Patient's hypertension is currently being managed by cardiology.  Nurse did check patient's blood pressure which was 140/90 which is much improved from prior visits.  Patient canceled office visit as she did not need a letter/clearance.

## 2019-01-27 ENCOUNTER — Telehealth: Payer: Self-pay | Admitting: Cardiology

## 2019-01-27 NOTE — Telephone Encounter (Signed)
° ° °  COVID-19 Pre-Screening Questions:   In the past 7 to 10 days have you had a cough,  shortness of breath, headache, conesti noon, fever (100 or greater) body aches, chills, sore throat, or sudden loss of taste or sense of smell? no  Have you been around anyone with known Covid 19.Have you been around anyone who is awaiting Covid 19 test results in the past 7 to 10 days? no Have you been around anyone who has been exposed to Covid 19, or has mentioned symptoms of Covid 19 within the past 7 to 10 days? no If you have any concerns/questions about symptoms patients report during screening (either on the phone or at threshold). Contact the provider seeing the patient or DOD for further guidance.  If neither are available contact a member of the leadership team.   I called pt to confirm appt with pt for 01-31-19 with Dr Martinique.

## 2019-01-27 NOTE — Telephone Encounter (Signed)
New Message:    Pt says she will need help coming up for her appt on Monday, since daughter is not allowed to come in. She will call when she get there on Monday.

## 2019-01-27 NOTE — Telephone Encounter (Signed)
Spoke to patient advised her daughter can come back with her at her appointment with Dr.Jordan on Mon 7/6.Daughter has to help her.

## 2019-01-28 NOTE — Progress Notes (Signed)
Cardiology Office Note   Date:  01/31/2019   ID:  Theresa [name], DOB 09-18-1945, MRN [id number]  PCP:  Antony Blackbird, MD  Cardiologist:   Shaniya Tashiro Martinique, MD   Chief Complaint  Patient presents with  . Follow-up  . Hypertension      History of Present Illness: Theresa [name] is a 73 y.o. female who is seen at the request of Dr Chapman Fitch for evaluation of palpitations and labile high blood pressure. She has a long history of poorly controlled HTN. Seen with her daughter today who is a Insurance account manager. Patient brings BP readings ranging from 175-102 systolic and 58-527 diastolic. A number of medications have been tried but she usually stops taking them because she states they cause her heart to race. This includes clonidine, hydralazine and even Coreg. She did have swelling on amlodipine. She reports recently being started on spironolactone but this made her urinate frequently and she thought she was dehydrated. Pulse rate is typically 90. She denies edema, SOB, HA, dizziness or syncope. Only had chest pain when on clonidine.   On her last visit we had increased metoprolol to 50 mg bid. Later added Cardizem but HR became too low and metoprolol dose was decreased. BP remained high so lisinopril switched to Valsartan and Cardizem was discontinued.   On follow up today she is feeling much better. Palpitations have largely resolved. BP readings are reviewed from her diary. BP runs a little higher in the am 140-160. In the evenings it is 135-150. HR 55-60. She is tolerating medication well.    Past Medical History:  Diagnosis Date  . Gastric ulcer   . Hyperlipidemia   . Hypertension   . River blindness   . Sinusitis     Past Surgical History:  Procedure Laterality Date  . APPENDECTOMY    . BREAST LUMPECTOMY       Current Outpatient Medications  Medication Sig Dispense Refill  . acetaminophen (TYLENOL) 500 MG tablet Take 500 mg by mouth every 6 (six) hours as needed for mild pain or  moderate pain.    Marland Kitchen ALPRAZolam (XANAX) 0.25 MG tablet Take 1 or 2 pill about 30 minutes prior to procedures 10 tablet 0  . folic acid (FOLVITE) 782 MCG tablet Take by mouth.    . metoprolol tartrate (LOPRESSOR) 50 MG tablet Take 0.5 tablets (25 mg total) by mouth 2 (two) times daily. 180 tablet 3  . Multiple Vitamin (MULTIVITAMIN) tablet Take 1 tablet by mouth daily.    . rosuvastatin (CRESTOR) 5 MG tablet Take 1 tablet (5 mg total) by mouth daily. 90 tablet 3  . valsartan (DIOVAN) 320 MG tablet Take 1 tablet (320 mg total) by mouth daily. 30 tablet 6   No current facility-administered medications for this visit.     Allergies:   Amlodipine, Coreg [carvedilol], Shellfish allergy, Trazodone and nefazodone, Hydralazine hcl, Augmentin [amoxicillin-pot clavulanate], Clonidine derivatives, Lisinopril, and Lisinopril-hydrochlorothiazide    Social History:  The patient  reports that she has never smoked. She has never used smokeless tobacco. She reports that she does not drink alcohol or use drugs.   Family History:  The patient's family history includes Cancer in her brother; Heart disease in her mother; Hypertension in her mother.    ROS:  Please see the history of present illness.   Otherwise, review of systems are positive for none.  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 All other systems are reviewed and negative.    PHYSICAL EXAM: VS:  Pulse 97   Ht 5\' 4"  (1.626 m)   Wt 168 lb 3.2 oz (76.3 kg)   BMI 28.87 kg/m  , BMI Body mass index is 28.87 kg/m. GEN: Well nourished, well developed elderly BF, in no acute distress  HEENT: normal  Neck: no JVD, carotid bruits, or masses Cardiac: RRR; no murmurs, rubs, or gallops,no edema  Respiratory:  clear to auscultation bilaterally, normal work of breathing GI: soft, nontender, nondistended, + BS MS: no deformity or atrophy  Skin: warm and dry, no rash Neuro:  Strength and sensation  are intact Psych: euthymic mood, full affect   EKG:  EKG is not ordered today.  Recent Labs: 08/16/2018: Hemoglobin 12.8; Platelets 105 10/04/2018: ALT 10 10/11/2018: BUN 16; Creatinine, Ser 0.94; Potassium 4.7; Sodium 141    Lipid Panel    Component Value Date/Time   CHOL 243 (H) 12/31/2017 1140   TRIG 64 12/31/2017 1140   HDL 84 12/31/2017 1140   CHOLHDL 2.9 12/31/2017 1140   CHOLHDL 3.4 06/11/2016 0941   VLDL 13 06/11/2016 0941   LDLCALC 146 (H) 12/31/2017 1140      Wt Readings from Last 3 Encounters:  01/31/19 168 lb 3.2 oz (76.3 kg)  10/28/18 170 lb (77.1 kg)  10/11/18 164 lb (74.4 kg)      Other studies Reviewed: Additional studies/ records that were reviewed today include:   Renal US 11/01/15: Summary: Findings suggest 1-59% renal artery stenosis. There is evidence of elevated resistive indices in bilateral intrarenal arteries. Incidental finding: there is evidence of multiple simple cysts in the left kidney, largest measuring 1.14cm.    ASSESSMENT AND PLAN:  1. Severe, uncontrolled HTN. Has stopped multiple medication due to perceived intolerances. Recommend sodium restriction. Currently BP improved on combination of metoprolol and Valsartan which she is tolerating well. Given multiple intolerances will continue current therapy 2. Palpitations. Likely exacerbated by severe HTN. Good response to beta blocker.  3. Hypercholesterolemia. Recommend adding Crestor 5 mg daily. Plan on repeating lab work with primary care in October.  4. Aortic atherosclerosis noted on prior renal US. Continue to modifiy risk factors.    Current medicines are reviewed at length with the patient today.  The patient does not have concerns regarding medicines.  The following changes have been made:  none  Labs/ tests ordered today include:  No orders of the defined types were placed in this encounter.    Disposition:   FU with me in 6 months  Signed, Hernan Turnage Martinique, MD  01/31/2019  11:01 AM    [facility] [address] [place], [place], [address] Phone [phone], Fax [phone]

## 2019-01-31 ENCOUNTER — Encounter: Payer: Self-pay | Admitting: Cardiology

## 2019-01-31 ENCOUNTER — Other Ambulatory Visit: Payer: Self-pay

## 2019-01-31 ENCOUNTER — Ambulatory Visit (INDEPENDENT_AMBULATORY_CARE_PROVIDER_SITE_OTHER): Payer: No Typology Code available for payment source | Admitting: Cardiology

## 2019-01-31 VITALS — BP 148/94 | HR 97 | Ht 64.0 in | Wt 168.2 lb

## 2019-01-31 DIAGNOSIS — I10 Essential (primary) hypertension: Secondary | ICD-10-CM

## 2019-01-31 DIAGNOSIS — E7800 Pure hypercholesterolemia, unspecified: Secondary | ICD-10-CM

## 2019-01-31 DIAGNOSIS — R002 Palpitations: Secondary | ICD-10-CM

## 2019-01-31 MED FILL — ?ROSUVASTATIN CALCIUM 5MG T: 5 | 90 days supply | Qty: 90 | Fill #1

## 2019-01-31 MED FILL — ?METOPROLOL 50 MG TABLET: 50 | 90 days supply | Qty: 180 | Fill #1

## 2019-01-31 MED FILL — LUMIGAN 0.01% EYE DROPS: 0.01 | 23 days supply | Qty: 3 | Fill #0

## 2019-01-31 NOTE — Patient Instructions (Signed)
Continue your current therapy  Follow up in 6 months   

## 2019-02-03 ENCOUNTER — Other Ambulatory Visit: Payer: Self-pay | Admitting: Cardiology

## 2019-02-03 NOTE — Telephone Encounter (Signed)
Follow up    Patient actually was able to find the medication at another pharmacy. She is requesting that an rx be sent there.     *STAT* If patient is at the pharmacy, call can be transferred to refill team.   1. Which medications need to be refilled? (please list name of each medication and dose if known) valsartan (DIOVAN) 320 MG tablet   2. Which pharmacy/location (including street and city if local pharmacy) is medication to be sent to? [facility] - [place], [place] - [address]  3. Do they need a 30 day or 90 day supply? 90 day

## 2019-02-03 NOTE — Telephone Encounter (Signed)
New message:    Patient daughter calling concerning a medication that is on back order Valsartan 320 mg. She would like to know if there is something else they can get. Patient is out of the medication.

## 2019-02-04 MED ORDER — VALSARTAN 320 MG PO TABS: 320.0000 mg | ORAL_TABLET | Freq: Every day | ORAL | 3 refills | Status: DC

## 2019-02-04 NOTE — Addendum Note (Signed)
Addended by: Diana Eves on: 02/04/2019 10:47 AM   Modules accepted: Orders

## 2019-02-04 NOTE — Telephone Encounter (Signed)
Rx(s) sent to pharmacy electronically.  

## 2019-02-08 ENCOUNTER — Telehealth: Payer: Self-pay | Admitting: Cardiology

## 2019-02-08 NOTE — Telephone Encounter (Signed)
Spoke with patient - she states she takes valsartan and morning metoprolol together   blood pressure   114/86   @ 10 am .  143/91 9:30 pm  Patient  States she also  not eating as much  - losing weigth per primary   suggest  Eating  More protein ,  - dairy,beans, ensure , boost   Patient aware will defer to Dr Martinique and contact her back

## 2019-02-08 NOTE — Telephone Encounter (Signed)
°  Pt c/o medication issue:  1. Name of Medication: valsartan (DIOVAN) 320 MG tablet  2. How are you currently taking this medication (dosage and times per day)? 320 mg 1x daily  3. Are you having a reaction (difficulty breathing--STAT)?   4. What is your medication issue? Dizziness  Patient would like to know if she could cut the medication in half and take 1/2 in the morning and the other 1/2 at night   STAT if patient feels like he/she is going to faint   1) Are you dizzy now? yes  2) Do you feel faint or have you passed out? No   3) Do you have any other symptoms? Constant dizziness  4) Have you checked your HR and BP (record if available)?

## 2019-02-08 NOTE — Telephone Encounter (Signed)
BP is OK. Should continue current therapy  Berneda Piccininni Martinique MD, [facility]

## 2019-02-09 NOTE — Telephone Encounter (Signed)
Spoke to patient Dr.Jordan's advice given.Stated she is still dizzy.She wanted to know if ok to take Striction for B/P she bought a health food store.Advised not to take.Advised to continue current medications.Advised to call back if she continues to have dizziness.

## 2019-02-10 ENCOUNTER — Telehealth: Payer: Self-pay | Admitting: Family Medicine

## 2019-02-10 NOTE — Telephone Encounter (Signed)
Pt called to confirm BP is lower at other provider appt. Pt request info for follow up instructions for BP here.

## 2019-02-11 ENCOUNTER — Telehealth: Payer: Self-pay | Admitting: Cardiology

## 2019-02-11 NOTE — Telephone Encounter (Signed)
She should get a clearance letter from her cardiologist who is now managing her blood pressure. She can ask her eye doctor to contact her cardiologist for pre-operative clearance

## 2019-02-11 NOTE — Telephone Encounter (Signed)
Patient is requesting a letter for surgery clearance. Does patient need to make an appointment.

## 2019-02-11 NOTE — Telephone Encounter (Signed)
Per pt call her cataract surgery has been postponed.  Pt is requesting a letter stating her BP is under control.   The surgery is on 7/22.   Please give pt a call when ready.

## 2019-02-11 NOTE — Telephone Encounter (Signed)
Pt called because her BP is not under control and is requesting a clearance  letter form Dr. Chapman Fitch for her surgery, please follow up.

## 2019-02-11 NOTE — Telephone Encounter (Signed)
Patient was called and informed of PCP not writing letter and to contact her heart doctor.

## 2019-02-14 ENCOUNTER — Telehealth: Payer: Self-pay

## 2019-02-14 NOTE — Telephone Encounter (Signed)
OK to send a letter or notification that BP is controlled for cataract surgery  Peter Martinique MD, [facility]

## 2019-02-14 NOTE — Telephone Encounter (Signed)
Called patient left message on personal voice mail Dr.Jordan cleared you for cataract surgery.Advised to call me back and let me know where to fax letter to.

## 2019-02-14 NOTE — Telephone Encounter (Signed)
Incoming call from Elly Modena, LPN who requests that letter created for pt today be printed out and left at front desk for pick up. Triage nurse placed letter in envelope; available for pick up at front desk

## 2019-02-14 NOTE — Telephone Encounter (Signed)
Spoke to patient letter to clear her for cataract surgery will be left at front desk for pick up.

## 2019-02-14 NOTE — Telephone Encounter (Signed)
  Patient will send someone to pick up her clearance letter if it can be put up front for her

## 2019-03-23 ENCOUNTER — Other Ambulatory Visit: Payer: Self-pay

## 2019-03-23 ENCOUNTER — Ambulatory Visit: Payer: Self-pay | Attending: Family Medicine

## 2019-04-15 MED FILL — LUMIGAN 0.01% EYE DROPS: 0.01 | 23 days supply | Qty: 3 | Fill #1

## 2019-04-21 ENCOUNTER — Ambulatory Visit: Payer: No Typology Code available for payment source | Admitting: Family Medicine

## 2019-04-21 ENCOUNTER — Encounter: Payer: Self-pay | Admitting: Family Medicine

## 2019-04-21 ENCOUNTER — Other Ambulatory Visit: Payer: Self-pay

## 2019-04-21 ENCOUNTER — Ambulatory Visit: Payer: Self-pay | Attending: Family Medicine | Admitting: Family Medicine

## 2019-04-21 VITALS — BP 170/100 | HR 79 | Ht 64.0 in | Wt 172.2 lb

## 2019-04-21 DIAGNOSIS — K0889 Other specified disorders of teeth and supporting structures: Secondary | ICD-10-CM

## 2019-04-21 DIAGNOSIS — I10 Essential (primary) hypertension: Secondary | ICD-10-CM

## 2019-04-21 DIAGNOSIS — D696 Thrombocytopenia, unspecified: Secondary | ICD-10-CM

## 2019-04-21 DIAGNOSIS — Z23 Encounter for immunization: Secondary | ICD-10-CM

## 2019-04-21 NOTE — Progress Notes (Signed)
Established Patient Office Visit  Subjective:  Patient ID: Theresa [name], female    DOB: 08-29-1945  Age: 73 y.o. MRN: [id number]  CC: No chief complaint on file.   HPI Theresa [name], 73 yo female, who reports that she was referred to a dentist to have extractions of some of her teeth. She has had 2 out of the 3 teeth extracted however, she was supposed to have the 3rd tooth extracted in March but this was rescheduled due to [place] and she has never been able to contact anyone to have the last extraction done and she wonders if there is another dental office that she can be referred to in order to have the last extraction done.  She continues to have some tooth pain but does not feel that she currently has a tooth infection.       She reports that she has had complications from her eye surgery and she has eye pain and problems with her vision as she is supposed to have another procedure.  She reports that at home her blood pressures are much better controlled.  At home, her blood pressures are in the 130s to 140s over 70s to 80s.  She denies any headaches or dizziness related to her blood pressure.  She reports that she is compliant with taking her blood pressure medication on a daily basis.       She denies any issues with chest pain or palpitations.  She does have a history of a gastric ulcer but has had no unusual bruising or bleeding and no abdominal pain.  She has had no shortness of breath or cough.  No nausea/vomiting/diarrhea or constipation.  No blood in the stool or dark/black stools.  No peripheral edema.  No fever or chills.  Past Medical History:  Diagnosis Date  . Gastric ulcer   . Hyperlipidemia   . Hypertension   . River blindness   . Sinusitis     Past Surgical History:  Procedure Laterality Date  . APPENDECTOMY    . BREAST LUMPECTOMY      Family History  Problem Relation Age of Onset  . Heart disease Mother   . Hypertension Mother   . Cancer Brother      Social History   Socioeconomic History  . Marital status: Widowed    Spouse name: Not on file  . Number of children: 2  . Years of education: Not on file  . Highest education level: Not on file  Occupational History  . Not on file  Social Needs  . Financial resource strain: Not on file  . Food insecurity    Worry: Not on file    Inability: Not on file  . Transportation needs    Medical: Not on file    Non-medical: Not on file  Tobacco Use  . Smoking status: Never Smoker  . Smokeless tobacco: Never Used  Substance and Sexual Activity  . Alcohol use: No  . Drug use: No  . Sexual activity: Not Currently    Birth control/protection: Post-menopausal  Lifestyle  . Physical activity    Days per week: Not on file    Minutes per session: Not on file  . Stress: Not on file  Relationships  . Social Herbalist on phone: Not on file    Gets together: Not on file    Attends religious service: Not on file    Active member of club or organization: Not on file  Attends meetings of clubs or organizations: Not on file    Relationship status: Not on file  . Intimate partner violence    Fear of current or ex partner: Not on file    Emotionally abused: Not on file    Physically abused: Not on file    Forced sexual activity: Not on file  Other Topics Concern  . Not on file  Social History Narrative  . Not on file    Outpatient Medications Prior to Visit  Medication Sig Dispense Refill  . acetaminophen (TYLENOL) 500 MG tablet Take 500 mg by mouth every 6 (six) hours as needed for mild pain or moderate pain.    Marland Kitchen ALPRAZolam (XANAX) 0.25 MG tablet Take 1 or 2 pill about 30 minutes prior to procedures 10 tablet 0  . folic acid (FOLVITE) A999333 MCG tablet Take by mouth.    . metoprolol tartrate (LOPRESSOR) 50 MG tablet Take 0.5 tablets (25 mg total) by mouth 2 (two) times daily. 180 tablet 3  . Multiple Vitamin (MULTIVITAMIN) tablet Take 1 tablet by mouth daily.    .  rosuvastatin (CRESTOR) 5 MG tablet Take 1 tablet (5 mg total) by mouth daily. 90 tablet 3  . valsartan (DIOVAN) 320 MG tablet Take 1 tablet (320 mg total) by mouth daily. 90 tablet 3   No facility-administered medications prior to visit.     Allergies  Allergen Reactions  . Amlodipine Swelling  . Coreg [Carvedilol] Other (See Comments)    Drowsiness, dizziness, palpitations  . Shellfish Allergy Swelling  . Trazodone And Nefazodone Swelling    Possible eye pain and swelling  . Hydralazine Hcl Palpitations  . Augmentin [Amoxicillin-Pot Clavulanate]   . Clonidine Derivatives Other (See Comments)    "heart came out of chest"  . Lisinopril Palpitations  . Lisinopril-Hydrochlorothiazide Other (See Comments)    Possible flushing, insomnia    ROS Review of Systems  Constitutional: Positive for fatigue. Negative for chills and fever.  HENT: Positive for dental problem. Negative for sore throat and trouble swallowing.   Eyes: Positive for visual disturbance. Negative for photophobia.  Respiratory: Negative for cough and shortness of breath.   Cardiovascular: Negative for chest pain, palpitations and leg swelling.  Gastrointestinal: Negative for abdominal pain, blood in stool, constipation, diarrhea and nausea.  Endocrine: Negative for cold intolerance, heat intolerance, polydipsia, polyphagia and polyuria.  Genitourinary: Negative for dysuria and frequency.  Musculoskeletal: Negative for back pain and gait problem.  Skin: Negative for rash and wound.  Neurological: Negative for dizziness and light-headedness.  Hematological: Negative for adenopathy. Does not bruise/bleed easily.  Psychiatric/Behavioral: Negative for self-injury and suicidal ideas. The patient is nervous/anxious.       Objective:    Physical Exam  Constitutional: She is oriented to person, place, and time. She appears well-developed and well-nourished.  Well-nourished well-developed older female in no acute distress  sitting on chair in the exam room.  Patient is wearing mask as per COVID-19 precautions  Neck: Normal range of motion. Neck supple. No JVD present.  Cardiovascular: Normal rate and regular rhythm.  Pulmonary/Chest: Effort normal and breath sounds normal.  Abdominal: Soft. There is no abdominal tenderness. There is no rebound and no guarding.  Musculoskeletal:        General: No tenderness or edema.     Comments: No CVA tenderness  Lymphadenopathy:    She has no cervical adenopathy.  Neurological: She is alert and oriented to person, place, and time.  Skin: Skin is warm and  dry.  Psychiatric:  Patient with flattened affect and also has seems anxious which is consistent with the patient's prior visits  Nursing note and vitals reviewed.   BP (!) 170/100 (BP Location: Left Arm, Patient Position: Sitting, Cuff Size: Normal)   Pulse 79   Ht 5\' 4"  (1.626 m)   Wt 172 lb 3.2 oz (78.1 kg)   SpO2 100%   BMI 29.56 kg/m  Wt Readings from Last 3 Encounters:  04/21/19 172 lb 3.2 oz (78.1 kg)  01/31/19 168 lb 3.2 oz (76.3 kg)  10/28/18 170 lb (77.1 kg)     Health Maintenance Due  Topic Date Due  . Hepatitis C Screening  Sep 24, 1945  . FOOT EXAM  06/17/1954  . MAMMOGRAM  06/17/1994  . DEXA SCAN  06/17/2009  . OPHTHALMOLOGY EXAM  10/28/2017  . INFLUENZA VACCINE  02/26/2019  . HEMOGLOBIN A1C  04/13/2019     Lab Results  Component Value Date   TSH 1.46 10/02/2015   Lab Results  Component Value Date   WBC 4.3 08/16/2018   HGB 12.8 08/16/2018   HCT 38.2 08/16/2018   MCV 82.5 08/16/2018   PLT 105 (L) 08/16/2018   Lab Results  Component Value Date   NA 141 10/11/2018   K 4.7 10/11/2018   CO2 23 10/11/2018   GLUCOSE 101 (H) 10/11/2018   BUN 16 10/11/2018   CREATININE 0.94 10/11/2018   BILITOT 0.5 10/04/2018   ALKPHOS 53 10/04/2018   AST 16 10/04/2018   ALT 10 10/04/2018   PROT 7.5 10/04/2018   ALBUMIN 4.7 10/04/2018   CALCIUM 9.5 10/11/2018   ANIONGAP 8 08/16/2018   Lab  Results  Component Value Date   CHOL 243 (H) 12/31/2017   Lab Results  Component Value Date   HDL 84 12/31/2017   Lab Results  Component Value Date   LDLCALC 146 (H) 12/31/2017   Lab Results  Component Value Date   TRIG 64 12/31/2017   Lab Results  Component Value Date   CHOLHDL 2.9 12/31/2017   Lab Results  Component Value Date   HGBA1C 5.2 10/11/2018      Assessment & Plan:  1. Hypertension, malignant Patient's blood pressure today's visit is very elevated but she reports compliance with her blood pressure medication and that her home blood pressures are within a normal range.  Patient has had nurse visits here within the past few months at which time blood pressure was controlled.  She reports that she is anxious regarding complications from her recent eye surgery and the need to have repeat surgery as well as having difficulty rescheduling her dental extraction.  Patient is to continue her blood pressure medication and home monitoring and to notify the office of blood pressures remained elevated.  She has also been asked to schedule nurse visit blood pressure recheck.  She will have BMP at today's visit in follow-up of use of blood pressure medication.  She should also continue to follow-up with cardiology regarding her elevated blood pressure.  She is not interested in any treatment for her anxiety at this time. - Basic Metabolic Panel  2. Tooth pain Referral was placed for dental evaluation to see if there is any different dental resource available for patient's dental extraction.  Due to the current COVID-19 pandemic, patient's appointment was cancelled and she has had difficulty rescheduling her appointment.  - Ambulatory referral to Dentistry  3. Thrombocytopenia ([place]) Patient with low platelet count on past blood work and this will be  rechecked at today's visit. She denies any unusual bruising or bleeding - CBC with Differential  4. Need for immunization against  influenza She as offered and agreed to have influenza immunization which was done as a nurse visit while she was here in the office.  An After Visit Summary was printed and given to the patient.  Follow-up: Return in about 4 months (around 08/21/2019) for HTN- nurse visit BP recheck in 2-3 weeks; .   Antony Blackbird, MD

## 2019-04-25 MED FILL — CLINDAMYCIN HCL 300 MG CAPS: 300 | 10 days supply | Qty: 40 | Fill #0

## 2019-05-05 ENCOUNTER — Ambulatory Visit: Payer: No Typology Code available for payment source | Admitting: Pharmacist

## 2019-05-12 ENCOUNTER — Other Ambulatory Visit (HOSPITAL_BASED_OUTPATIENT_CLINIC_OR_DEPARTMENT_OTHER): Payer: No Typology Code available for payment source | Admitting: Family Medicine

## 2019-05-12 ENCOUNTER — Ambulatory Visit: Payer: Self-pay | Attending: Family Medicine | Admitting: Pharmacist

## 2019-05-12 ENCOUNTER — Other Ambulatory Visit: Payer: Self-pay

## 2019-05-12 ENCOUNTER — Telehealth: Payer: Self-pay | Admitting: *Deleted

## 2019-05-12 DIAGNOSIS — I10 Essential (primary) hypertension: Secondary | ICD-10-CM

## 2019-05-12 DIAGNOSIS — F418 Other specified anxiety disorders: Secondary | ICD-10-CM

## 2019-05-12 DIAGNOSIS — R4589 Other symptoms and signs involving emotional state: Secondary | ICD-10-CM

## 2019-05-12 MED ORDER — ALPRAZOLAM 0.25 MG PO TABS: ORAL_TABLET | ORAL | 0 refills | Status: DC

## 2019-05-12 NOTE — Telephone Encounter (Signed)
Per pt she is having dental surgery on Monday and would like another script for Xanax to help her with her anxiety. Per pt provider had done this for her in the past.

## 2019-05-12 NOTE — Telephone Encounter (Signed)
Notify patient that prescription for alprazolam to take prior to her procedure was sent to [facility]

## 2019-05-12 NOTE — Progress Notes (Signed)
Patient ID: Theresa [name], female   DOB: [birth year], 73 y.o.   MRN: [id number]   Phone call received as patient requested refill of alprazolam to take prior to upcoming dental procedure this Monday.  Prescription will be sent to patient's pharmacy.

## 2019-05-12 NOTE — Progress Notes (Signed)
   S:    PCP Dr. Chapman Fitch   Patient arrives in poor spirits.  Presents to the clinic for hypertension management. Patient was referred by Dr. Chapman Fitch on 04/21/19.  Patient reports adherence with medications.  Current BP Medications include:   - Metoprolol tartrate 50 mg BID - Valsartan 320 mg daily   Antihypertensives tried in the past include:  - Amlodipine (swelling) - Carvedilol (excessive drowsiness, dizziness and palpitations) - Hydralazine (palpitations) - lisinopril (palpitatons) - Lisinopril-HCTZ (flushing and insomnia) - Valsartan (palpitations)  Dietary habits include: limits salt, drinks 1 cup of green tea daily Exercise habits include: mobility is limited - she is able to ambulate but does not exercise Family / Social history:  - FHx: heart disease (mother) - Tobacco: never smoker - Alcohol: no alcohol  Home BP readings:  - SBPs: 148-187 - DBPs: 77 - 91 - Home pulse rate: 49 - 70   O:  L arm after 5 minutes rest: 178/98  Last 3 Office BP readings: BP Readings from Last 3 Encounters:  04/21/19 (!) 170/100  01/31/19 (!) 148/94  10/28/18 (!) 220/100   BMET    Component Value Date/Time   NA 141 10/11/2018 1310   K 4.7 10/11/2018 1310   CL 101 10/11/2018 1310   CO2 23 10/11/2018 1310   GLUCOSE 101 (H) 10/11/2018 1310   GLUCOSE 110 (H) 08/16/2018 1416   BUN 16 10/11/2018 1310   CREATININE 0.94 10/11/2018 1310   CREATININE 0.90 10/02/2015 1204   CALCIUM 9.5 10/11/2018 1310   GFRNONAA 60 10/11/2018 1310   GFRAA 69 10/11/2018 1310   Renal function: CrCl cannot be calculated (Patient's most recent lab result is older than the maximum 21 days allowed.).  Clinical ASCVD: No  The 10-year ASCVD risk score Mikey Bussing DC Jr., et al., 2013) is: 56.3%   Values used to calculate the score:     Age: 55 years     Sex: Female     Is Non-Hispanic African American: Yes     Diabetic: Yes     Tobacco smoker: No     Systolic Blood Pressure: 123XX123 mmHg     Is BP treated: Yes   HDL Cholesterol: 84 mg/dL     Total Cholesterol: 243 mg/dL  A/P: Hypertension longstanding currently uncontrolled on current medications. BP Goal = 130/80 mmHg. Patient is adherent with current medications.  Unfortunately, we are running out of tx options with the degree of medication intolerability that the patient experiences.  -Continued valsartan and metoprolol  -Counseled on lifestyle modifications for blood pressure control including reduced dietary sodium, increased exercise, adequate sleep  Results reviewed and written information provided. Total time in face-to-face counseling 15 minutes.   F/U Clinic Visit w/ PCP.  Benard Halsted, PharmD, [place] [phone]

## 2019-05-13 ENCOUNTER — Other Ambulatory Visit (HOSPITAL_BASED_OUTPATIENT_CLINIC_OR_DEPARTMENT_OTHER): Payer: No Typology Code available for payment source | Admitting: Family Medicine

## 2019-05-13 ENCOUNTER — Encounter: Payer: Self-pay | Admitting: *Deleted

## 2019-05-13 DIAGNOSIS — D696 Thrombocytopenia, unspecified: Secondary | ICD-10-CM

## 2019-05-13 LAB — CBC WITH DIFFERENTIAL/PLATELET
Basophils Absolute: 0.1 x10E3/uL (ref 0.0–0.2)
Basos: 1 %
EOS (ABSOLUTE): 0.2 x10E3/uL (ref 0.0–0.4)
Eos: 3 %
Hematocrit: 38.4 % (ref 34.0–46.6)
Hemoglobin: 12.8 g/dL (ref 11.1–15.9)
Immature Grans (Abs): 0 x10E3/uL (ref 0.0–0.1)
Immature Granulocytes: 0 %
Lymphocytes Absolute: 1.6 x10E3/uL (ref 0.7–3.1)
Lymphs: 34 %
MCH: 28.1 pg (ref 26.6–33.0)
MCHC: 33.3 g/dL (ref 31.5–35.7)
MCV: 84 fL (ref 79–97)
Monocytes Absolute: 0.4 x10E3/uL (ref 0.1–0.9)
Monocytes: 8 %
Neutrophils Absolute: 2.5 x10E3/uL (ref 1.4–7.0)
Neutrophils: 54 %
Platelets: 70 x10E3/uL — CL (ref 150–450)
RBC: 4.55 x10E6/uL (ref 3.77–5.28)
RDW: 12.8 % (ref 11.7–15.4)
WBC: 4.8 x10E3/uL (ref 3.4–10.8)

## 2019-05-13 LAB — BASIC METABOLIC PANEL WITH GFR
BUN/Creatinine Ratio: 17 (ref 12–28)
BUN: 17 mg/dL (ref 8–27)
CO2: 26 mmol/L (ref 20–29)
Calcium: 9.2 mg/dL (ref 8.7–10.3)
Chloride: 103 mmol/L (ref 96–106)
Creatinine, Ser: 0.98 mg/dL (ref 0.57–1.00)
GFR calc Af Amer: 66 mL/min/1.73
GFR calc non Af Amer: 57 mL/min/1.73 — ABNORMAL LOW
Glucose: 110 mg/dL — ABNORMAL HIGH (ref 65–99)
Potassium: 4.2 mmol/L (ref 3.5–5.2)
Sodium: 141 mmol/L (ref 134–144)

## 2019-05-13 NOTE — Progress Notes (Signed)
Patient verified DOB Patient is aware of platelet count being low. Patient denies any bleeding or bruising. Patient advised to report to the ED if she experiences any bleeding or bruising. Patient will pick up a copy next week and await a phone call from the specialist.

## 2019-05-13 NOTE — Progress Notes (Signed)
Patient ID: Theresa [name], female   DOB: Dec 18, 1945, 74 y.o.   MRN: [id number]   73 year old female who had blood work done on 05/12/2019 and patient with platelet count of 70.  Patient will be notified of this abnormality and also that she has been referred to hematology for further evaluation.  She will also be told to go to the emergency department this weekend if she is not feeling well or has any unusual bruising/bleeding or any other concerns.

## 2019-05-17 NOTE — Telephone Encounter (Signed)
Spoke with patient and she stated that her daughter picked up her medication yesterday.

## 2019-05-18 ENCOUNTER — Telehealth: Payer: Self-pay | Admitting: Hematology and Oncology

## 2019-05-18 NOTE — Telephone Encounter (Signed)
Received a new referral from Dr. Chapman Fitch for thrombocytopenia.Ms. Theresa [name] has been cld and scheduled to see Dr. Lindi [name] on 11/16 at 345pm. Pt aware to arrive 15 minutes early.

## 2019-05-19 ENCOUNTER — Other Ambulatory Visit: Payer: Self-pay

## 2019-05-19 ENCOUNTER — Ambulatory Visit: Payer: Self-pay

## 2019-05-23 ENCOUNTER — Telehealth: Payer: Self-pay | Admitting: Cardiology

## 2019-05-23 NOTE — Telephone Encounter (Signed)
New Message  Patient states that she has blood work done and results should have been faxed over from the PCP. Please give patient a call back to discuss results.

## 2019-05-23 NOTE — Telephone Encounter (Signed)
Will route to Primary nurse to advise of results- since they were sent from PCP and need review by MD.

## 2019-05-23 NOTE — Telephone Encounter (Signed)
Spoke to patient she stated she had recent lab work done with her PCP.Stated she is being referred to a hematologist for low platelets.She wanted Dr.Jordan to know.Advised Dr.Jordan is out of office this week.Advised to keep appointment with hematologist 06/13/19.I will make Dr.Jordan aware next week when he is back in office.

## 2019-05-27 NOTE — Telephone Encounter (Signed)
Patient was calling again to follow-up with the discussion earlier in the week

## 2019-05-27 NOTE — Telephone Encounter (Signed)
Returned call to patient.She stated she has been having dizziness,weakness,tired.No chest pain.Appointment scheduled with Jory Sims DNP Monday 05/30/19 at 12:00 noon.Advised to bring all medications to appointment.

## 2019-05-28 NOTE — Progress Notes (Signed)
Cardiology Office Note   Date:  05/30/2019   ID:  Theresa [name], DOB November 08, 1945, MRN [id number]  PCP:  Antony Blackbird, MD  Cardiologist: Dr.Jordan  CC: Hypertension    History of Present Illness: Theresa [name] is a 73 y.o. female who presents for ongoing assessment and management of palpitations, hypertension, with a long history of poorly controlled hypertension.  She was last seen in the office by Dr. Martinique on 01/31/2019 with elevated blood pressure.    She is intolerant to multiple medications to include clonidine, hydralazine, and Coreg as she states that they cause her heart to race.  She had some lower extremity edema on amlodipine.  She was started on spironolactone but stopped taking this as well because she had complaints of frequent urination.  She is currently on metoprolol 50 mg twice daily, was unable to tolerate diltiazem due to bradycardia, and valsartan was started.  During last office visit, blood pressure was improved on combination of metoprolol and valsartan which she was tolerating.  She had no further complaints of palpitations.  Crestor 5 mg daily was added due to hypercholesterolemia.  Theresa [name] comes today very upset and hypertensive. She was recently seen by PCP and was found to have thrombocytopenia with platelet count of 77. She was referred to hematology. She is scared about this diagnosis. She has not taken her valsartan correctly, only taking 1/2 tablet today and has not taken her metoprolol at all.   She states that she has been too nervous to take her medications. She complains of palpitations and some dizziness today.    Past Medical History:  Diagnosis Date  . Gastric ulcer   . Hyperlipidemia   . Hypertension   . River blindness   . Sinusitis     Past Surgical History:  Procedure Laterality Date  . APPENDECTOMY    . BREAST LUMPECTOMY       Current Outpatient Medications  Medication Sig Dispense Refill  . acetaminophen (TYLENOL) 500 MG tablet  Take 500 mg by mouth every 6 (six) hours as needed for mild pain or moderate pain.    Marland Kitchen ALPRAZolam (XANAX) 0.25 MG tablet Take 1 or 2 pill about 30 minutes prior to procedures 10 tablet 0  . folic acid (FOLVITE) A999333 MCG tablet Take by mouth.    . metoprolol tartrate (LOPRESSOR) 50 MG tablet Take 0.5 tablets (25 mg total) by mouth 2 (two) times daily. 180 tablet 3  . Multiple Vitamin (MULTIVITAMIN) tablet Take 1 tablet by mouth daily.    . rosuvastatin (CRESTOR) 5 MG tablet Take 1 tablet (5 mg total) by mouth daily. 90 tablet 3  . valsartan (DIOVAN) 320 MG tablet Take 1 tablet (320 mg total) by mouth daily. 90 tablet 3   No current facility-administered medications for this visit.     Allergies:   Amlodipine, Coreg [carvedilol], Shellfish allergy, Trazodone and nefazodone, Hydralazine hcl, Augmentin [amoxicillin-pot clavulanate], Clonidine derivatives, Lisinopril, and Lisinopril-hydrochlorothiazide    Social History:  The patient  reports that she has never smoked. She has never used smokeless tobacco. She reports that she does not drink alcohol or use drugs.   Family History:  The patient's family history includes Cancer in her brother; Heart disease in her mother; Hypertension in her mother.    ROS: All other systems are reviewed and negative. Unless otherwise mentioned in H&P    PHYSICAL EXAM: VS:  BP (!) 230/106   Pulse (!) 105   Ht 5\' 4"  (1.626 m)  Wt 173 lb (78.5 kg)   BMI 29.70 kg/m  , BMI Body mass index is 29.7 kg/m. GEN: Well nourished, well developed, in no acute distress HEENT: normal Neck: no JVD, carotid bruits, or masses Cardiac: RRR; no murmurs, rubs, or gallops,no edema  Respiratory:  Clear to auscultation bilaterally, normal work of breathing GI: soft, nontender, nondistended, + BS MS: no deformity or atrophy Skin: warm and dry, no rash Neuro:  Strength and sensation are intact Psych: euthymic mood, full affect,tearful.    EKG: Sinus tachycardia with LAE,  rate of 105 bpm.  Recent Labs: 10/04/2018: ALT 10 05/12/2019: BUN 17; Creatinine, Ser 0.98; Hemoglobin 12.8; Platelets 70; Potassium 4.2; Sodium 141    Lipid Panel    Component Value Date/Time   CHOL 243 (H) 12/31/2017 1140   TRIG 64 12/31/2017 1140   HDL 84 12/31/2017 1140   CHOLHDL 2.9 12/31/2017 1140   CHOLHDL 3.4 06/11/2016 0941   VLDL 13 06/11/2016 0941   LDLCALC 146 (H) 12/31/2017 1140      Wt Readings from Last 3 Encounters:  05/30/19 173 lb (78.5 kg)  04/21/19 172 lb 3.2 oz (78.1 kg)  01/31/19 168 lb 3.2 oz (76.3 kg)      Other studies Reviewed: Renal US 11/01/15: Summary: Findings suggest 1-59% renal artery stenosis. There is evidence of elevated resistive indices in bilateral intrarenal arteries. Incidental finding: there is evidence of multiple simple cysts in the left kidney, largest measuring 1.14cm. ASSESSMENT AND PLAN:  1 Uncontrolled hypertension: Multifactorial in the setting of medication non-compliance and emotional upset. She has her medications with her today and I have asked her to take the other half of the valsartan and one whole metoprolol tablet, 25 mg.  Tomorrow she will go back to taking valsartan and metoprolol as directed.   I have talked to her about the importance of medical compliance. I am concerned about intercranial bleed or CVA if her BP is not controlled. She verbalized understanding.  2. Anxiety:Quite anxious and tearful today. She is on Xanax 0.25 mg prn. I have asked her to take his at [facility] tonight to allow her to sleep. She has not been sleeping since her diagnosis of thrombocytopenia.   3. Newly diagnosed thrombocytopenia:  She has been referred to hematology and is to be seen later this month for recommendations and treatment.   Current medicines are reviewed at length with the patient today.    Labs/ tests ordered today include: None  Phill Myron. West Pugh, ANP, AACC   05/30/2019 12:53 PM    [facility]  [place] [address] Office [phone] Fax [phone]  Notice: This dictation was prepared with Dragon dictation along with smaller phrase technology. Any transcriptional errors that result from this process are unintentional and may not be corrected upon review.

## 2019-05-30 ENCOUNTER — Ambulatory Visit (INDEPENDENT_AMBULATORY_CARE_PROVIDER_SITE_OTHER): Payer: No Typology Code available for payment source | Admitting: Adult Health

## 2019-05-30 ENCOUNTER — Other Ambulatory Visit: Payer: Self-pay

## 2019-05-30 ENCOUNTER — Encounter: Payer: Self-pay | Admitting: Adult Health

## 2019-05-30 VITALS — BP 230/106 | HR 105 | Ht 64.0 in | Wt 173.0 lb

## 2019-05-30 DIAGNOSIS — D696 Thrombocytopenia, unspecified: Secondary | ICD-10-CM

## 2019-05-30 DIAGNOSIS — I10 Essential (primary) hypertension: Secondary | ICD-10-CM

## 2019-05-30 DIAGNOSIS — F418 Other specified anxiety disorders: Secondary | ICD-10-CM

## 2019-05-30 NOTE — Patient Instructions (Signed)
Medication Instructions:  Continue current medications  *If you need a refill on your cardiac medications before your next appointment, please call your pharmacy*  Lab Work: None Ordered  Testing/Procedures: None Ordered  Follow-Up: At [facility], you and your health needs are our priority.  As part of our continuing mission to provide you with exceptional heart care, we have created designated Provider Care Teams.  These Care Teams include your primary Cardiologist (physician) and Advanced Practice Providers (APPs -  Physician Assistants and Nurse Practitioners) who all work together to provide you with the care you need, when you need it.  Your next appointment:   1 Month  The format for your next appointment:   In Person  Provider:   Jory Sims, DNP, ANP

## 2019-06-12 NOTE — Progress Notes (Signed)
[place] NOTE  Patient Care Team: Antony Blackbird, MD as PCP - General (Family Medicine)  CHIEF COMPLAINTS/PURPOSE OF CONSULTATION:  Newly diagnosed thrombocytopenia  HISTORY OF PRESENTING ILLNESS:  Theresa [name] 73 y.o. female is here because of recent diagnosis of thrombocytopenia. She is referred by her PCP, Dr. Antony Blackbird. Labs on 05/12/19 showed platelets 70. She presents to the clinic today for initial evaluation and discussion of treatment options.  She has a prior history of hypertension and hyperlipidemia but has had no problems with bruising or bleeding.  Previous surgeries have not had any bleeding complications.  She does not have any family history of blood disorders.  She does not have any personal history of blood disorders either.  Her blood pressure is really well controlled  I reviewed her records extensively and collaborated the history with the patient.  MEDICAL HISTORY:  Past Medical History:  Diagnosis Date  . Gastric ulcer   . Hyperlipidemia   . Hypertension   . River blindness   . Sinusitis     SURGICAL HISTORY: Past Surgical History:  Procedure Laterality Date  . APPENDECTOMY    . BREAST LUMPECTOMY      SOCIAL HISTORY: Social History   Socioeconomic History  . Marital status: Widowed    Spouse name: Not on file  . Number of children: 2  . Years of education: Not on file  . Highest education level: Not on file  Occupational History  . Not on file  Social Needs  . Financial resource strain: Not on file  . Food insecurity    Worry: Not on file    Inability: Not on file  . Transportation needs    Medical: Not on file    Non-medical: Not on file  Tobacco Use  . Smoking status: Never Smoker  . Smokeless tobacco: Never Used  Substance and Sexual Activity  . Alcohol use: No  . Drug use: No  . Sexual activity: Not Currently    Birth control/protection: Post-menopausal  Lifestyle  . Physical activity    Days per  week: Not on file    Minutes per session: Not on file  . Stress: Not on file  Relationships  . Social Herbalist on phone: Not on file    Gets together: Not on file    Attends religious service: Not on file    Active member of club or organization: Not on file    Attends meetings of clubs or organizations: Not on file    Relationship status: Not on file  . Intimate partner violence    Fear of current or ex partner: Not on file    Emotionally abused: Not on file    Physically abused: Not on file    Forced sexual activity: Not on file  Other Topics Concern  . Not on file  Social History Narrative  . Not on file    FAMILY HISTORY: Family History  Problem Relation Age of Onset  . Heart disease Mother   . Hypertension Mother   . Cancer Brother     ALLERGIES:  is allergic to amlodipine; coreg [carvedilol]; shellfish allergy; trazodone and nefazodone; hydralazine hcl; augmentin [amoxicillin-pot clavulanate]; clonidine derivatives; lisinopril; and lisinopril-hydrochlorothiazide.  MEDICATIONS:  Current Outpatient Medications  Medication Sig Dispense Refill  . acetaminophen (TYLENOL) 500 MG tablet Take 500 mg by mouth every 6 (six) hours as needed for mild pain or moderate pain.    Marland Kitchen ALPRAZolam (XANAX) 0.25 MG  tablet Take 1 or 2 pill about 30 minutes prior to procedures 10 tablet 0  . folic acid (FOLVITE) A999333 MCG tablet Take by mouth.    . metoprolol tartrate (LOPRESSOR) 50 MG tablet Take 0.5 tablets (25 mg total) by mouth 2 (two) times daily. 180 tablet 3  . Multiple Vitamin (MULTIVITAMIN) tablet Take 1 tablet by mouth daily.    . rosuvastatin (CRESTOR) 5 MG tablet Take 1 tablet (5 mg total) by mouth daily. 90 tablet 3  . valsartan (DIOVAN) 320 MG tablet Take 1 tablet (320 mg total) by mouth daily. 90 tablet 3   No current facility-administered medications for this visit.     REVIEW OF SYSTEMS:   Constitutional: Denies fevers, chills or abnormal night sweats Eyes:  Denies blurriness of vision, double vision or watery eyes Ears, nose, mouth, throat, and face: Denies mucositis or sore throat Respiratory: Denies cough, dyspnea or wheezes Cardiovascular: Denies palpitation, chest discomfort or lower extremity swelling Gastrointestinal:  Denies nausea, heartburn or change in bowel habits Skin: Denies abnormal skin rashes Lymphatics: Denies new lymphadenopathy or easy bruising Neurological:Denies numbness, tingling or new weaknesses Behavioral/Psych: Mood is stable, no new changes  Breast: Denies any palpable lumps or discharge All other systems were reviewed with the patient and are negative.  PHYSICAL EXAMINATION: ECOG PERFORMANCE STATUS: 1 - Symptomatic but completely ambulatory  Vitals:   06/13/19 1608  BP: (!) 223/99  Pulse: 81  Resp: 17  Temp: 98.2 F (36.8 C)  SpO2: 100%   Filed Weights   06/13/19 1608  Weight: 174 lb 6.4 oz (79.1 kg)    GENERAL:alert, no distress and comfortable SKIN: skin color, texture, turgor are normal, no rashes or significant lesions EYES: normal, conjunctiva are pink and non-injected, sclera clear OROPHARYNX:no exudate, no erythema and lips, buccal mucosa, and tongue normal  NECK: supple, thyroid normal size, non-tender, without nodularity LYMPH:  no palpable lymphadenopathy in the cervical, axillary or inguinal LUNGS: clear to auscultation and percussion with normal breathing effort HEART: regular rate & rhythm and no murmurs and no lower extremity edema ABDOMEN:abdomen soft, non-tender and normal bowel sounds Musculoskeletal:no cyanosis of digits and no clubbing  PSYCH: alert & oriented x 3 with fluent speech NEURO: no focal motor/sensory deficits  LABORATORY DATA:  I have reviewed the data as listed Lab Results  Component Value Date   WBC 6.2 06/13/2019   HGB 13.0 06/13/2019   HCT 38.7 06/13/2019   MCV 85.4 06/13/2019   PLT 101 (L) 06/13/2019   Lab Results  Component Value Date   NA 140  06/13/2019   K 4.0 06/13/2019   CL 103 06/13/2019   CO2 25 06/13/2019    RADIOGRAPHIC STUDIES: I have personally reviewed the radiological reports and agreed with the findings in the report.  ASSESSMENT AND PLAN:  Thrombocytopenia ([place]) Isolated thrombocytopenia:  10/02/2015: Platelet count: 129 08/16/2018: Platelet count 105 05/12/2019: Platelet count 70 Remainder of the CBC including the differential have been normal  Differential diagnosis: 1.  Medications: Both Crestor and Diovan can cause thrombocytopenia. 2.  Autoimmune phenomena like ITP 3.  Liver disorders: LFTs done in 2019 were normal 4.  Bone marrow disorders: Like MDS 5.  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  CBC 06/13/2019: Platelets 101 with elevated mean platelet fraction suggesting consumption. 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  I will call her next week with the results of these tests  and ultrasound results.  All questions were answered. The patient knows to call the clinic with any problems, questions or concerns.   Rulon Eisenmenger, MD, MPH 06/14/2019    I, Molly Dorshimer, am acting as scribe for Nicholas Lose, MD.  I have reviewed the above documentation for accuracy and completeness, and I agree with the above.

## 2019-06-13 ENCOUNTER — Other Ambulatory Visit: Payer: Self-pay

## 2019-06-13 ENCOUNTER — Inpatient Hospital Stay: Payer: No Typology Code available for payment source

## 2019-06-13 ENCOUNTER — Inpatient Hospital Stay
Payer: No Typology Code available for payment source | Attending: Hematology and Oncology | Admitting: Hematology and Oncology

## 2019-06-13 DIAGNOSIS — D696 Thrombocytopenia, unspecified: Secondary | ICD-10-CM

## 2019-06-13 DIAGNOSIS — Z79899 Other long term (current) drug therapy: Secondary | ICD-10-CM | POA: Insufficient documentation

## 2019-06-13 DIAGNOSIS — E785 Hyperlipidemia, unspecified: Secondary | ICD-10-CM | POA: Insufficient documentation

## 2019-06-13 DIAGNOSIS — I10 Essential (primary) hypertension: Secondary | ICD-10-CM | POA: Insufficient documentation

## 2019-06-13 LAB — CBC WITH DIFFERENTIAL (CANCER CENTER ONLY)
Abs Immature Granulocytes: 0 10*3/uL (ref 0.00–0.07)
Basophils Absolute: 0.1 10*3/uL (ref 0.0–0.1)
Basophils Relative: 1 %
Eosinophils Absolute: 0.2 10*3/uL (ref 0.0–0.5)
Eosinophils Relative: 4 %
HCT: 38.7 % (ref 36.0–46.0)
Hemoglobin: 13 g/dL (ref 12.0–15.0)
Immature Granulocytes: 0 %
Lymphocytes Relative: 30 %
Lymphs Abs: 1.8 10*3/uL (ref 0.7–4.0)
MCH: 28.7 pg (ref 26.0–34.0)
MCHC: 33.6 g/dL (ref 30.0–36.0)
MCV: 85.4 fL (ref 80.0–100.0)
Monocytes Absolute: 0.5 10*3/uL (ref 0.1–1.0)
Monocytes Relative: 8 %
Neutro Abs: 3.6 10*3/uL (ref 1.7–7.7)
Neutrophils Relative %: 57 %
Platelet Count: 101 10*3/uL — ABNORMAL LOW (ref 150–400)
RBC: 4.53 MIL/uL (ref 3.87–5.11)
RDW: 13.2 % (ref 11.5–15.5)
WBC Count: 6.2 10*3/uL (ref 4.0–10.5)
nRBC: 0 % (ref 0.0–0.2)

## 2019-06-13 LAB — IMMATURE PLATELET FRACTION: Immature Platelet Fraction: 33.8 % — ABNORMAL HIGH (ref 1.2–8.6)

## 2019-06-13 LAB — CMP (CANCER CENTER ONLY)
ALT: 12 U/L (ref 0–44)
AST: 16 U/L (ref 15–41)
Albumin: 4.2 g/dL (ref 3.5–5.0)
Alkaline Phosphatase: 75 U/L (ref 38–126)
Anion gap: 12 (ref 5–15)
BUN: 16 mg/dL (ref 8–23)
CO2: 25 mmol/L (ref 22–32)
Calcium: 9.2 mg/dL (ref 8.9–10.3)
Chloride: 103 mmol/L (ref 98–111)
Creatinine: 1.11 mg/dL — ABNORMAL HIGH (ref 0.44–1.00)
GFR, Est AFR Am: 57 mL/min — ABNORMAL LOW (ref >60)
GFR, Estimated: 49 mL/min — ABNORMAL LOW (ref >60)
Glucose, Bld: 123 mg/dL — ABNORMAL HIGH (ref 70–99)
Potassium: 4 mmol/L (ref 3.5–5.1)
Sodium: 140 mmol/L (ref 135–145)
Total Bilirubin: 0.3 mg/dL (ref 0.3–1.2)
Total Protein: 8 g/dL (ref 6.5–8.1)

## 2019-06-13 LAB — PLATELET BY CITRATE

## 2019-06-13 NOTE — Assessment & Plan Note (Signed)
Isolated thrombocytopenia:  10/02/2015: Platelet count: 129 08/16/2018: Platelet count 105 05/12/2019: Platelet count 70 Remainder of the CBC including the differential have been normal  Differential diagnosis: 1.  Medications 2.  Autoimmune phenomena like ITP 3.  Liver disorders 4.  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  Recheck CBC in a blue top tube to avoid any clumping so that we can verify the platelet count I discussed with the patient that there is no indication for treatment of ITP unless her platelet count drops below 50.  If it drops below 50 we can consider doing a bone marrow biopsy and consideration for treatment for ITP.

## 2019-06-14 ENCOUNTER — Telehealth: Payer: Self-pay | Admitting: Family Medicine

## 2019-06-14 ENCOUNTER — Telehealth: Payer: Self-pay | Admitting: Cardiology

## 2019-06-14 ENCOUNTER — Telehealth: Payer: Self-pay | Admitting: Hematology and Oncology

## 2019-06-14 NOTE — Telephone Encounter (Signed)
Patient had a visit with Dr. Lindi Adie at the cancer center yesterday, and would like Dr. Doug Sou feedback on what was discussed at the appointment in regards to her medication Dr. Martinique put her on. The patient said the notes should be in her chart.

## 2019-06-14 NOTE — Telephone Encounter (Signed)
Returned call to patient she stated she saw Dr.Gudena yesterday and he advised her crestor and diovan could be causing low platelets.She wanted to ask Dr.Jordan if he would prescribe something else.Advised I will speak to Dr.Jordan this afternoon and call her back.

## 2019-06-14 NOTE — Telephone Encounter (Signed)
Patient wanted you to know she went to see the doctors you referred her to

## 2019-06-14 NOTE — Telephone Encounter (Signed)
I talk with patient regarding schedule  

## 2019-06-14 NOTE — Telephone Encounter (Signed)
Returned call to patient.I spoke to Dr.Jordan he advised she needs to continue Crestor and Diovan.Stated she is getting worse.Stated she is weak and tired.Stated she would like a phone visit to discuss her problems.Virtual visit scheduled with Jory Sims DNP 06/16/19 at 8:00 am.

## 2019-06-15 NOTE — Telephone Encounter (Signed)
Patient called requesting to speak with her PCP regarding her medications.

## 2019-06-15 NOTE — Progress Notes (Signed)
Virtual Visit via Telephone Note   This visit type was conducted due to national recommendations for restrictions regarding the COVID-19 Pandemic (e.g. social distancing) in an effort to limit this patient's exposure and mitigate transmission in our community.  Due to her co-morbid illnesses, this patient is at least at moderate risk for complications without adequate follow up.  This format is felt to be most appropriate for this patient at this time.  The patient did not have access to video technology/had technical difficulties with video requiring transitioning to audio format only (telephone).  All issues noted in this document were discussed and addressed.  No physical exam could be performed with this format.  Please refer to the patient's chart for her  consent to telehealth for [facility].   Date:  06/15/2019   ID:  Theresa [name], DOB [birth year], MRN [id number]  Patient Location: Home Provider Location: Home  PCP:  Antony Blackbird, MD  Cardiologist:  Electrophysiologist:  None   Evaluation Performed:  Follow-Up Visit  Chief Complaint:  Uncontrolled hypertension   History of Present Illness:    Theresa [name] is a 73 y.o. female we are following for ongoing assessment and management of palpitations, hypertension, with a long history of poorly controlled hypertension.  She is intolerant to multiple medications to include clonidine, hydralazine, and Coreg as she states this was causing her heart to race.  She also had some lower extremity edema on amlodipine.  She was started on spironolactone but stopped taking this as well because she had complaints of frequent urination.  She is currently tolerating metoprolol and valsartan.  When last seen by me on 05/30/2019 she was very upset and hypertensive.  She was seen by her PCP recently and was found to have thrombocytopenia with a platelet count of 77.  She was referred to hematology and this was very upsetting to her.  Her blood  pressure was elevated and she had not taken her medicines and stated that she was too nervous to take them.  On the last office visit I gave her reassurance and told her to please take your medications as directed.  She was advised to follow-up with her primary care physician for her anxiety.  She is on Xanax 0.25 mg as needed.  She may need to take it at night to help her to sleep as she states that the anxiety is keeping her awake at night.  She was seen by Dr. Payton Mccallum on 06/13/2019.  Hematologist.  It was noted that medications to include Crestor and Diovan can influence and cause thrombocytopenia.  She was scheduled for an ultrasound of her spleen, and also to have further testing thereafter.  At the time of that office visit her platelets were 101.  She called our office on 06/14/2019 concerned that she should be taken off of Crestor and Diovan.  This was referred to Dr. Martinique who asked her to continue her medications as directed until all of her tests have come back from hematology.  She is very anxious and hypertensive on the phone today. She states that she is dizzy and feels weak. She is compliant with her medications, but is now uncomfortable taking them because of what the hematologist told her about the above medications.  The patient does not have symptoms concerning for COVID-19 infection (fever, chills, cough, or new shortness of breath).    Past Medical History:  Diagnosis Date  . Gastric ulcer   . Hyperlipidemia   . Hypertension   .  River blindness   . Sinusitis    Past Surgical History:  Procedure Laterality Date  . APPENDECTOMY    . BREAST LUMPECTOMY       No outpatient medications have been marked as taking for the 06/16/19 encounter (Appointment) with Lendon Colonel, NP.     Allergies:   Amlodipine, Coreg [carvedilol], Shellfish allergy, Trazodone and nefazodone, Hydralazine hcl, Augmentin [amoxicillin-pot clavulanate], Clonidine derivatives, Lisinopril, and  Lisinopril-hydrochlorothiazide   Social History   Tobacco Use  . Smoking status: Never Smoker  . Smokeless tobacco: Never Used  Substance Use Topics  . Alcohol use: No  . Drug use: No     Family Hx: The patient's family history includes Cancer in her brother; Heart disease in her mother; Hypertension in her mother.  ROS:   Please see the history of present illness.    All other systems reviewed and are negative.   Prior CV studies:   The following studies were reviewed today:  Renal Artery Ultrasound 2017 Findings suggest 1-59% renal artery stenosis. There is evidence of elevated resistive indices in bilateral intrarenal arteries. Incidental finding: there is evidence of multiple simple cysts in the left kidney, largest measuring 1.14cm.  Labs/Other Tests and Data Reviewed:    EKG:  No ECG reviewed.  Recent Labs: 06/13/2019: ALT 12; BUN 16; Creatinine 1.11; Hemoglobin 13.0; Platelet Count 101; Potassium 4.0; Sodium 140   Recent Lipid Panel Lab Results  Component Value Date/Time   CHOL 243 (H) 12/31/2017 11:40 AM   TRIG 64 12/31/2017 11:40 AM   HDL 84 12/31/2017 11:40 AM   CHOLHDL 2.9 12/31/2017 11:40 AM   CHOLHDL 3.4 06/11/2016 09:41 AM   LDLCALC 146 (H) 12/31/2017 11:40 AM    Wt Readings from Last 3 Encounters:  06/13/19 174 lb 6.4 oz (79.1 kg)  05/30/19 173 lb (78.5 kg)  04/21/19 172 lb 3.2 oz (78.1 kg)     Objective:    Vital Signs:  There were no vitals taken for this visit.   VITAL SIGNS:  reviewed GEN:  no acute distress PSYCH:  Very anxious.   ASSESSMENT & PLAN:    1. Uncontrolled hypertension: Intolerant to multiple medications. She will need a repeat renal artery ultrasound follow up from 2017  She had multiple cysts at that time. Should be seen my pharmacist to help with medication management. She is anxious about the Diovan and Crestor as Hematology told her that those medications contributed to thrombocytopenia. May need work up for Bright's  Disease.  Will await results.   2. Anxiety: Significantly impacting her ability to keep BP controlled.She is not on any medications for help with this.  Would consider referral to PCP or Behavioral health for assistance.   3. Hypercholesterolemia: She is now on Crestor but is uncomfortable taking this. She could not tolerate Pravastatin or Zocor due to myalgia's.  May need to consider PCSK9 inhibitor for her.  Seeing pharmacy.   4. Thrombocytopenia: Followed by hematology   COVID-19 Education: The signs and symptoms of COVID-19 were discussed with the patient and how to seek care for testing (follow up with PCP or arrange E-visit). The importance of social distancing was discussed today.  Time:   Today, I have spent 20 minutes with the patient with telehealth technology discussing the above problems.     Medication Adjustments/Labs and Tests Ordered: Current medicines are reviewed at length with the patient today.  Concerns regarding medicines are outlined above.   Tests Ordered: No orders of the defined  types were placed in this encounter.   Medication Changes: No orders of the defined types were placed in this encounter.   Disposition:  Follow up 3 months after seeing Hypertension clinic  Signed, Phill Myron. West Pugh, ANP, AACC  06/15/2019 2:21 PM    [facility]

## 2019-06-16 ENCOUNTER — Telehealth (INDEPENDENT_AMBULATORY_CARE_PROVIDER_SITE_OTHER): Payer: No Typology Code available for payment source | Admitting: Adult Health

## 2019-06-16 ENCOUNTER — Encounter: Payer: Self-pay | Admitting: Adult Health

## 2019-06-16 VITALS — BP 178/86 | HR 62 | Ht 64.0 in | Wt 173.0 lb

## 2019-06-16 DIAGNOSIS — I10 Essential (primary) hypertension: Secondary | ICD-10-CM

## 2019-06-16 DIAGNOSIS — E7800 Pure hypercholesterolemia, unspecified: Secondary | ICD-10-CM

## 2019-06-16 DIAGNOSIS — Z9114 Patient's other noncompliance with medication regimen: Secondary | ICD-10-CM

## 2019-06-16 DIAGNOSIS — F418 Other specified anxiety disorders: Secondary | ICD-10-CM

## 2019-06-16 MED FILL — LUMIGAN 0.01% EYE DROPS: 0.01 | 18 days supply | Qty: 3 | Fill #0

## 2019-06-16 NOTE — Patient Instructions (Signed)
Medication Instructions:  Continue current medications  *If you need a refill on your cardiac medications before your next appointment, please call your pharmacy*  Lab Work: None Ordered  Testing/Procedures: Your physician has requested that you have a renal artery duplex. During this test, an ultrasound is used to evaluate blood flow to the kidneys. Allow one hour for this exam. Do not eat after midnight the day before and avoid carbonated beverages. Take your medications as you usually do.  Follow-Up: At [facility], you and your health needs are our priority.  As part of our continuing mission to provide you with exceptional heart care, we have created designated Provider Care Teams.  These Care Teams include your primary Cardiologist (physician) and Advanced Practice Providers (APPs -  Physician Assistants and Nurse Practitioners) who all work together to provide you with the care you need, when you need it.  Your next appointment:   2 week(s)  The format for your next appointment:   In Person  Provider:   Hypertension Clinic with the Pharmacist

## 2019-06-17 ENCOUNTER — Telehealth: Payer: Self-pay | Admitting: Hematology and Oncology

## 2019-06-17 ENCOUNTER — Telehealth: Payer: Self-pay | Admitting: Adult Health

## 2019-06-17 NOTE — Telephone Encounter (Signed)
appt set for Dec 3.

## 2019-06-17 NOTE — Telephone Encounter (Signed)
New message    Patient calling to discuss changes in Crestor and Valsartan. She would like to confirm if the dosage was changed.

## 2019-06-17 NOTE — Telephone Encounter (Signed)
Spoke with pt and is very anxious re B/P Theresa [name] seen pt yesterday and is requesting an appt with HTN clinic in the next week or so .Will forward message to Pharmacists to make appt ./cy

## 2019-06-19 NOTE — Progress Notes (Signed)
  HEMATOLOGY-ONCOLOGY TELEPHONE VISIT PROGRESS NOTE  I connected with Theresa [name] on 06/20/2019 at 10:45 AM EST by telephone and verified that I am speaking with the correct person using two identifiers.  I discussed the limitations, risks, security and privacy concerns of performing an evaluation and management service by telephone and the availability of in person appointments.  I also discussed with the patient that there may be a patient responsible charge related to this service. The patient expressed understanding and agreed to proceed.   History of Present Illness: Theresa [name] is a 73 y.o. female with above-mentioned history of thrombocytopenia. Labs on 06/13/19 showed: platelets 101, immature platelet fraction 33.8. She presents over the phone today for follow-up.   Observations/Objective:  No new problems or concerns.   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  (Although in her case, low probability) We decided that she does not need to change the medications because if it was the medication the platelet count would not improve.  Ultrasound of the abdomen: Normal spleen  We can continue to watch and monitor this without any treatment. Return to clinic in 3 months with labs and follow-up     I discussed the assessment and treatment plan with the patient. The patient was provided an opportunity to ask questions and all were answered. The patient agreed with the plan and demonstrated an understanding of the instructions. The patient was advised to call back or seek an in-person evaluation if the symptoms worsen or if the condition fails to improve as anticipated.   I provided 15 minutes of  non-face-to-face time during this encounter.   Rulon Eisenmenger, MD 06/20/2019    I, Molly Dorshimer, am acting as scribe for Nicholas Lose, MD.  I have reviewed the above documentation for accuracy and completeness, and I agree with the above.

## 2019-06-20 ENCOUNTER — Ambulatory Visit (HOSPITAL_COMMUNITY)
Admission: RE | Admit: 2019-06-20 | Discharge: 2019-06-20 | Disposition: A | Discharge status: Home / Self Care | Payer: Self-pay | Source: Ambulatory Visit | Attending: Hematology and Oncology | Admitting: Hematology and Oncology

## 2019-06-20 ENCOUNTER — Inpatient Hospital Stay (HOSPITAL_BASED_OUTPATIENT_CLINIC_OR_DEPARTMENT_OTHER): Payer: No Typology Code available for payment source | Admitting: Hematology and Oncology

## 2019-06-20 ENCOUNTER — Other Ambulatory Visit: Payer: Self-pay

## 2019-06-20 DIAGNOSIS — D696 Thrombocytopenia, unspecified: Secondary | ICD-10-CM | POA: Insufficient documentation

## 2019-06-20 NOTE — Assessment & Plan Note (Signed)
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  (Although in her case, low probability)  Ultrasound of the abdomen to evaluate for splenomegaly being done today.  We can continue to watch and monitor this without any treatment. Return to clinic in 3 months with labs and follow-up

## 2019-06-21 ENCOUNTER — Telehealth: Payer: Self-pay | Admitting: Hematology and Oncology

## 2019-06-21 NOTE — Telephone Encounter (Signed)
I talk with patient regarding schedule  

## 2019-06-22 ENCOUNTER — Other Ambulatory Visit (HOSPITAL_BASED_OUTPATIENT_CLINIC_OR_DEPARTMENT_OTHER): Payer: No Typology Code available for payment source | Admitting: Family Medicine

## 2019-06-22 DIAGNOSIS — I10 Essential (primary) hypertension: Secondary | ICD-10-CM

## 2019-06-22 DIAGNOSIS — F411 Generalized anxiety disorder: Secondary | ICD-10-CM

## 2019-06-22 DIAGNOSIS — R4589 Other symptoms and signs involving emotional state: Secondary | ICD-10-CM

## 2019-06-22 DIAGNOSIS — F418 Other specified anxiety disorders: Secondary | ICD-10-CM

## 2019-06-22 MED ORDER — CLONAZEPAM 0.5 MG PO TABS: ORAL_TABLET | ORAL | 1 refills | Status: DC

## 2019-06-22 NOTE — Progress Notes (Signed)
Patient ID: Theresa [name], female   DOB: 03-20-1946, 73 y.o.   MRN: [id number]   Patient with issues with uncontrolled hypertension and anxiety. RX sent to her pharmacy for klonopin and referral to the clinical pharmacy in follow-up of her hypertension.

## 2019-06-22 NOTE — Telephone Encounter (Signed)
Please contact the patient and let her know that I am out of office.  I have read the notes from her blood specialist/hematologist and he would like for her to continue her current medications until the work-up has been completed on why she is having low platelets as it may not be the medication.  Please also let patient know that I would like to send a prescription to her pharmacy for a medicine that she can take twice daily to help with anxiety/nervousness that should also help to keep her blood pressure from spiking when she is feeling nervous or upset.  The medication is not a blood pressure medication but helps with feeling anxious.  I have also sent a message to her dentist office regarding the fact that currently her blood pressure is elevated and she is also having issues with a low platelet count.  I would like for patient to have a visit in the next 1 to 2 weeks with the clinical pharmacist and bring her home blood pressure monitor if she has 1 in order to see if her blood pressure is better controlled which may then allow her to have her dental extractions.

## 2019-06-28 NOTE — Telephone Encounter (Signed)
Please share with patient and schedule with you at your convenience.

## 2019-06-28 NOTE — Telephone Encounter (Signed)
Can you assist in scheduling this patient with me in the next 1-2 weeks for a BP check?

## 2019-06-30 ENCOUNTER — Telehealth: Payer: Self-pay | Admitting: *Deleted

## 2019-06-30 ENCOUNTER — Other Ambulatory Visit: Payer: Self-pay

## 2019-06-30 ENCOUNTER — Ambulatory Visit (INDEPENDENT_AMBULATORY_CARE_PROVIDER_SITE_OTHER)
Payer: No Typology Code available for payment source | Admitting: Pharmacist Clinician (PhC)/ Clinical Pharmacy Specialist

## 2019-06-30 DIAGNOSIS — I10 Essential (primary) hypertension: Secondary | ICD-10-CM

## 2019-06-30 MED ORDER — CANDESARTAN CILEXETIL 8 MG PO TABS: 8.0000 mg | ORAL_TABLET | Freq: Every day | ORAL | 1 refills | Status: DC

## 2019-06-30 NOTE — Telephone Encounter (Signed)
Per pt request, recent lab work and ultrasound printed off and mailed to address on file.

## 2019-06-30 NOTE — Progress Notes (Signed)
07/01/2019 Theresa [name] 06/26/1946 [id number]   HPI:  Theresa [name] is a 73 y.o. female patient of Dr Martinique, with a [place] below who presents today for hypertension clinic evaluation.  In addition to hypertension, her medical history is significant for a recent diagnosis of thrombocytopenia and some anxiety.  Patient has also been noted to have white coat hypertension and sensitivities to multiple medications.    She was last seen by Jory Sims NP about 3 weeks ago, and found to have a BP of 178/86.  She was not wanting to take medications.  She has just been seen by hematology and it was noted that some medications can influence or cause thrombocytopenia, including valsartan and rosuvastatin.  Since then, she has wanted to get off these.  Her platelet count since increased to 101, and it was noted by hematology that the medications were unlikely to be involved, as her platelets would not have improved if the meds were the cause, as she was still taking them.    Today she comes in, again very anxious and wanting off the valsartan.  She states that it is the cause of her feeling so poorly and unable to stand for long periods or move about easily.   Unfortunately there are not many other options to try for her. See list of intolerances below.  She is upset that her primary MD gave her a prescription for clonazepam - she read the package insert and is afraid she will become addicted to the medications.      Blood Pressure Goal:  130/80  Current Medications: metoprolol tartrate 25 mg bid, valsartan 320 mg qd  Diet: mostly home cooked foods, upset that she doesn't have the stamina to cook much  Exercise: none  Home BP readings: no readings with her, but notes that her meds "aren't working"  Intolerances: amlodipine (edema), carvedilol (?), hydralazine (palpitations), lisinopril (palpitations), chlorthalidone and hctz (flushing, "felt bad"), clonidine (heart came out of her chest),  spironolactone (frequent urination), diltiazem (tired, headache)  Labs:  04/2019:  Na 141, K 4.2, Glu 110, BUN 17, SCr 0.98, GFR AA 57  Wt Readings from Last 3 Encounters:  06/30/19 172 lb 9.6 oz (78.3 kg)  06/16/19 173 lb (78.5 kg)  06/13/19 174 lb 6.4 oz (79.1 kg)   BP Readings from Last 3 Encounters:  06/30/19 (!) 224/100  06/16/19 (!) 178/86  06/13/19 (!) 223/99   Pulse Readings from Last 3 Encounters:  06/30/19 (!) 110  06/16/19 62  06/13/19 81    Current Outpatient Medications  Medication Sig Dispense Refill  . acetaminophen (TYLENOL) 500 MG tablet Take 500 mg by mouth every 6 (six) hours as needed for mild pain or moderate pain.    Marland Kitchen ALPRAZolam (XANAX) 0.25 MG tablet Take 0.25 mg by mouth at bedtime as needed for anxiety.    . folic acid (FOLVITE) A999333 MCG tablet Take by mouth.    . metoprolol tartrate (LOPRESSOR) 50 MG tablet Take 0.5 tablets (25 mg total) by mouth 2 (two) times daily. 180 tablet 3  . Multiple Vitamin (MULTIVITAMIN) tablet Take 1 tablet by mouth daily.    . rosuvastatin (CRESTOR) 5 MG tablet Take 1 tablet (5 mg total) by mouth daily. 90 tablet 3  . candesartan (ATACAND) 8 MG tablet Take 1 tablet (8 mg total) by mouth daily. 30 tablet 1  . clonazePAM (KLONOPIN) 0.5 MG tablet Take 1/2 pill twice daily as needed for anxiety (Patient not taking: Reported  on 06/30/2019) 30 tablet 1   No current facility-administered medications for this visit.     Allergies  Allergen Reactions  . Amlodipine Swelling  . Coreg [Carvedilol] Other (See Comments)    Drowsiness, dizziness, palpitations  . Shellfish Allergy Swelling  . Trazodone And Nefazodone Swelling    Possible eye pain and swelling  . Hydralazine Hcl Palpitations  . Augmentin [Amoxicillin-Pot Clavulanate]   . Clonidine Derivatives Other (See Comments)    "heart came out of chest"  . Lisinopril Palpitations  . Lisinopril-Hydrochlorothiazide Other (See Comments)    Possible flushing, insomnia    Past  Medical History:  Diagnosis Date  . Gastric ulcer   . Hyperlipidemia   . Hypertension   . River blindness   . Sinusitis     Blood pressure (!) 224/100, pulse (!) 110, resp. rate 16, height 5\' 4"  (1.626 m), weight 172 lb 9.6 oz (78.3 kg), SpO2 98 %.  White coat syndrome with diagnosis of hypertension Patient with uncontrolled hypertension, obviously spiking in the office.  Patient was anxious when she came in today, tearful at times.  We reviewed all of her intolerances and I explained that there were not many things left to try to control her blood pressure.  She is worried about the thrombocytopenia and feels like the valsartan is "too much".  My only other option for medication class would be to try an alpha blocker such as doxazosin, but that is not a strong antihypertensive, and if we were to stop the valsartan and start that, there would be concerns if she didn't tolerate doxazosin and was then only on metoprolol.  I suggested instead that we try candesartan, as it is only an 8 mg tablet.  She was willing to give that a try, hopefully [facility] will be able to fill that for her.    Also had a discussion with her about the clonazepam and that she should not worry about addiction.  Reviewed that many seniors feel anxiety as they have more health issues and lose the independence in life that they once had.   Encouraged her to try taking 1/2 tablet at bedtime for a few days to see if that would help her rest and feel better.     Theresa [name] PharmD CPP [facility] [address] [place], [place] [address] [phone]

## 2019-06-30 NOTE — Patient Instructions (Signed)
Return for a a follow up appointment with Dr. Martinique in 2 weeks  Check your blood pressure at home daily and keep record of the readings.  Take your BP meds as follows:  When you get the prescription for candesartan, you can stop valsartan and take 1 candesartan tablet each day.  Continue with all other medications  Bring all of your meds, your BP cuff and your record of home blood pressures to your next appointment.  Exercise as you're able, try to walk approximately 30 minutes per day.  Keep salt intake to a minimum, especially watch canned and prepared boxed foods.  Eat more fresh fruits and vegetables and fewer canned items.  Avoid eating in fast food restaurants.    HOW TO TAKE YOUR BLOOD PRESSURE: . Rest 5 minutes before taking your blood pressure. .  Don't smoke or drink caffeinated beverages for at least 30 minutes before. . Take your blood pressure before (not after) you eat. . Sit comfortably with your back supported and both feet on the floor (don't cross your legs). . Elevate your arm to heart level on a table or a desk. . Use the proper sized cuff. It should fit smoothly and snugly around your bare upper arm. There should be enough room to slip a fingertip under the cuff. The bottom edge of the cuff should be 1 inch above the crease of the elbow. . Ideally, take 3 measurements at one sitting and record the average.

## 2019-07-01 ENCOUNTER — Encounter: Payer: Self-pay | Admitting: Pharmacist Clinician (PhC)/ Clinical Pharmacy Specialist

## 2019-07-01 ENCOUNTER — Telehealth: Payer: Self-pay | Admitting: Pharmacist Clinician (PhC)/ Clinical Pharmacy Specialist

## 2019-07-01 MED ORDER — ENALAPRIL MALEATE 10 MG PO TABS: ORAL_TABLET | ORAL | 1 refills | Status: DC

## 2019-07-01 MED FILL — ENALAPRIL MALEATE 10 MG TAB: 10 | 60 days supply | Qty: 180 | Fill #0

## 2019-07-01 NOTE — Telephone Encounter (Signed)
Patient will NOT take candesartan but is willing to resume enalapril therapy.  Reports palpitation with enalapril 20mg  BID but willing to take 10mg  in AM and 20mg  in PM  Rx sent to prefer pharmacy

## 2019-07-01 NOTE — Telephone Encounter (Signed)
Per pt call stated - she would like a call back about the medication CANDESARTAN she would like to speak to you about this and the side effects she stated she will not be able to take it.  Pt is requesting a call back please to discuss it. Pt would like to go back to taking   ELLENAPRIL ?    PLEASE give pt a call back.       Stated she would like it called into:    [facility]

## 2019-07-01 NOTE — Telephone Encounter (Signed)
Patient called stating she wants to come off the ellenapril. Please fu with the patient

## 2019-07-01 NOTE — Telephone Encounter (Signed)
Patient needs to continue with current medications and schedule an appointment to discuss change in therapy.

## 2019-07-01 NOTE — Assessment & Plan Note (Signed)
Patient with uncontrolled hypertension, obviously spiking in the office.  Patient was anxious when she came in today, tearful at times.  We reviewed all of her intolerances and I explained that there were not many things left to try to control her blood pressure.  She is worried about the thrombocytopenia and feels like the valsartan is "too much".  My only other option for medication class would be to try an alpha blocker such as doxazosin, but that is not a strong antihypertensive, and if we were to stop the valsartan and start that, there would be concerns if she didn't tolerate doxazosin and was then only on metoprolol.  I suggested instead that we try candesartan, as it is only an 8 mg tablet.  She was willing to give that a try, hopefully [facility] will be able to fill that for her.    Also had a discussion with her about the clonazepam and that she should not worry about addiction.  Reviewed that many seniors feel anxiety as they have more health issues and lose the independence in life that they once had.   Encouraged her to try taking 1/2 tablet at bedtime for a few days to see if that would help her rest and feel better.

## 2019-07-08 ENCOUNTER — Other Ambulatory Visit: Payer: Self-pay | Admitting: Family Medicine

## 2019-07-08 MED FILL — LATANOPROST 0.005% EYE DRP: 0.005 | 18 days supply | Qty: 3 | Fill #0

## 2019-07-08 NOTE — Telephone Encounter (Signed)
Please have patient make office visit. Will stop RX for klonopin and refill alprazolam to take 0.25 mg as needed prior to procedures without refills.

## 2019-07-15 NOTE — Progress Notes (Signed)
Cardiology Office Note   Date:  07/18/2019   ID:  Theresa [name], DOB 08-14-1945, MRN [id number]  PCP:  Antony Blackbird, MD  Cardiologist:   Joshual Terrio Martinique, MD   Chief Complaint  Patient presents with  . Hypertension      History of Present Illness: Theresa [name] is a 73 y.o. female who is seen for follow up of palpitations and labile high blood pressure. She has a long history of poorly controlled HTN. Seen with her daughter today who is a Insurance account manager. Patient brings BP readings ranging from [id number] systolic and [id number] diastolic. A number of medications have been tried but she usually stops taking them because she states they cause her heart to race. This includes clonidine, hydralazine and even Coreg. She did have swelling on amlodipine. She reports recently being started on spironolactone but this made her urinate frequently and she thought she was dehydrated. Pulse rate is typically 90. She denies edema, SOB, HA, dizziness or syncope. Only had chest pain when on clonidine.   On prior  visit we had increased metoprolol to 50 mg bid. Later added Cardizem but HR became too low and metoprolol dose was decreased. BP remained high so lisinopril switched to Valsartan and Cardizem was discontinued. Earlier this year she was diagnosed with thrombocytopenia with plt count to 77K. Seen by hematology. [place] of spleen negative. Platelet count improved to 101K. Patient was concerned that medication was causing this but according to hematology this was unlikely since platelets rebounded on medication. Seen by our Pharm D and due to patient concerns was switched from Valsartan to Candesartan. Primary care had prescribed Clonazepam for anxiety but she was concerned about taking this as well. She never filled these prescriptions. She is now taking enalapril 10 mg in the morning and 20 mg in the evening with her metoprolol. She has been on this for 2 weeks with BP 146-168/84-98.   She states she is under a  lot of stress with everything that has gone on with her family. Palpitations have largely resolved. She states with the valsartan she had too much fatigue, chest pain and wasn't sleeping well. She does not want to take anything for anxiety.    Past Medical History:  Diagnosis Date  . Gastric ulcer   . Hyperlipidemia   . Hypertension   . River blindness   . Sinusitis     Past Surgical History:  Procedure Laterality Date  . APPENDECTOMY    . BREAST LUMPECTOMY       Current Outpatient Medications  Medication Sig Dispense Refill  . acetaminophen (TYLENOL) 500 MG tablet Take 500 mg by mouth every 6 (six) hours as needed for mild pain or moderate pain.    Marland Kitchen ALPRAZolam (XANAX) 0.25 MG tablet Take 1 or 2 tablets by mouth about 30 minutes prior to procedures 10 tablet 0  . clonazePAM (KLONOPIN) 0.5 MG tablet Take 1/2 pill twice daily as needed for anxiety 30 tablet 1  . enalapril (VASOTEC) 10 MG tablet Take 1 tablet (10 mg total) by mouth every morning AND 2 tablets (20 mg total) at bedtime. 90 tablet 1  . folic acid (FOLVITE) A999333 MCG tablet Take by mouth.    . metoprolol tartrate (LOPRESSOR) 50 MG tablet Take 0.5 tablets (25 mg total) by mouth 2 (two) times daily. 180 tablet 3  . Multiple Vitamin (MULTIVITAMIN) tablet Take 1 tablet by mouth daily.    . rosuvastatin (CRESTOR) 5 MG tablet Take 1 tablet (  5 mg total) by mouth daily. 90 tablet 3   No current facility-administered medications for this visit.    Allergies:   Amlodipine, Coreg [carvedilol], Shellfish allergy, Trazodone and nefazodone, Hydralazine hcl, Augmentin [amoxicillin-pot clavulanate], Clonidine derivatives, Lisinopril, and Lisinopril-hydrochlorothiazide    Social History:  The patient  reports that she has never smoked. She has never used smokeless tobacco. She reports that she does not drink alcohol or use drugs.   Family History:  The patient's family history includes Cancer in her brother; Heart disease in her mother;  Hypertension in her mother.    ROS:  Please see the history of present illness.   Otherwise, review of systems are positive for none.  She states she was on a statin but she quit taking because it caused her to have varicose veins. All other systems are reviewed and negative.    PHYSICAL EXAM: VS:  BP (!) 190/102   Pulse 87   Ht 5\' 4"  (1.626 m)   Wt 173 lb 3.2 oz (78.6 kg)   SpO2 97%   BMI 29.73 kg/m  , BMI Body mass index is 29.73 kg/m. GEN: Well nourished, well developed elderly BF, in no acute distress  HEENT: normal  Neck: no JVD, carotid bruits, or masses Cardiac: RRR; no murmurs, rubs, or gallops,no edema  Respiratory:  clear to auscultation bilaterally, normal work of breathing GI: soft, nontender, nondistended, + BS MS: no deformity or atrophy  Skin: warm and dry, no rash Neuro:  Strength and sensation are intact Psych: euthymic mood, full affect   EKG:  EKG is not ordered today.  Recent Labs: 06/13/2019: ALT 12; BUN 16; Creatinine 1.11; Hemoglobin 13.0; Platelet Count 101; Potassium 4.0; Sodium 140    Lipid Panel    Component Value Date/Time   CHOL 243 (H) 12/31/2017 1140   TRIG 64 12/31/2017 1140   HDL 84 12/31/2017 1140   CHOLHDL 2.9 12/31/2017 1140   CHOLHDL 3.4 06/11/2016 0941   VLDL 13 06/11/2016 0941   LDLCALC 146 (H) 12/31/2017 1140      Wt Readings from Last 3 Encounters:  07/18/19 173 lb 3.2 oz (78.6 kg)  06/30/19 172 lb 9.6 oz (78.3 kg)  06/16/19 173 lb (78.5 kg)      Other studies Reviewed: Additional studies/ records that were reviewed today include:   Renal US 11/01/15: Summary: Findings suggest 1-59% renal artery stenosis. There is evidence of elevated resistive indices in bilateral intrarenal arteries. Incidental finding: there is evidence of multiple simple cysts in the left kidney, largest measuring 1.14cm.   Abdominal US 06/20/19: ABDOMEN ULTRASOUND COMPLETE  COMPARISON:  None  FINDINGS: Gallbladder: Normally distended  without stones or wall thickening. No pericholecystic fluid or sonographic Murphy sign.  Common bile duct: Diameter: 4 mm, normal  Liver: Normal echogenicity. Small cyst RIGHT lobe 2.1 x 2.1 x 1.7 cm. Additional tiny cyst superiorly RIGHT lobe 8 x 6 x 6 mm. Portal vein is patent on color Doppler imaging with normal direction of blood flow towards the liver.  IVC: Normal appearance  Pancreas: Normal appearance  Spleen: Normal appearance, 6.0 cm length  Right Kidney: Length: 9.9 cm. Normal cortical thickness and echogenicity. Small medial cyst 1.6 x 1.6 x 1.3 cm, simple features. No additional mass or hydronephrosis.  Left Kidney: Length: 11.0 cm. Normal morphology without mass or hydronephrosis.  Abdominal aorta: Normal caliber. Atherosclerotic changes at bifurcation.  Other findings: No free fluid.  IMPRESSION: Small hepatic and RIGHT renal cysts.  Atherosclerotic changes at aortic  bifurcation.  Otherwise negative exam.   Electronically Signed   By: Lavonia Dana M.D.   On: 06/20/2019 10:32    ASSESSMENT AND PLAN:  1. Severe, uncontrolled HTN. Has stopped multiple medication due to perceived intolerances. I am not sure these are real but she will not take medication we have recommended. Fortunately BP is not extreme but she is still at risk. Will continue current therapy with lisinopril and metoprolol. I told her we really didn't have much else to offer her with all her perceived intolerances. We could increase her am lisinopril but she just changed to this 2 weeks ago so we will see. Recommend sodium restriction.  2. Palpitations. Likely exacerbated by severe HTN. Good response to beta blocker.  3. Hypercholesterolemia. Recommend adding Crestor 5 mg daily.  4. Aortic atherosclerosis noted on prior renal US. Continue to modifiy risk factors.    Current medicines are reviewed at length with the patient today.  The patient does not have concerns regarding  medicines.  The following changes have been made:  none  Labs/ tests ordered today include:  No orders of the defined types were placed in this encounter.    Disposition:   FU in 3 months with APP  Signed, Rahiem Schellinger Martinique, MD  07/18/2019 2:53 PM    [facility] [address] [place], [place], [address] Phone [phone], Fax [phone]

## 2019-07-18 ENCOUNTER — Encounter: Payer: Self-pay | Admitting: Cardiology

## 2019-07-18 ENCOUNTER — Ambulatory Visit (INDEPENDENT_AMBULATORY_CARE_PROVIDER_SITE_OTHER): Payer: No Typology Code available for payment source | Admitting: Cardiology

## 2019-07-18 ENCOUNTER — Other Ambulatory Visit: Payer: Self-pay

## 2019-07-18 VITALS — BP 190/102 | HR 87 | Ht 64.0 in | Wt 173.2 lb

## 2019-07-18 DIAGNOSIS — I10 Essential (primary) hypertension: Secondary | ICD-10-CM

## 2019-07-18 DIAGNOSIS — D696 Thrombocytopenia, unspecified: Secondary | ICD-10-CM

## 2019-07-18 DIAGNOSIS — E7800 Pure hypercholesterolemia, unspecified: Secondary | ICD-10-CM

## 2019-08-10 MED FILL — LATANOPROST 0.005% EYE DRP: 0.005 | 18 days supply | Qty: 3 | Fill #1

## 2019-08-10 MED FILL — ROSUVASTATIN CALCIUM 5 MG T: 5 | 90 days supply | Qty: 90 | Fill #3

## 2019-08-16 MED FILL — LUMIGAN 0.01% EYE DROPS: 0.01 | 18 days supply | Qty: 3 | Fill #0

## 2019-08-25 ENCOUNTER — Other Ambulatory Visit: Payer: Self-pay

## 2019-08-25 ENCOUNTER — Ambulatory Visit: Payer: Self-pay | Attending: Family Medicine | Admitting: Family Medicine

## 2019-08-25 ENCOUNTER — Encounter: Payer: Self-pay | Admitting: Family Medicine

## 2019-08-25 VITALS — BP 212/76 | HR 82 | Temp 97.2°F | Ht 64.0 in | Wt 175.0 lb

## 2019-08-25 DIAGNOSIS — R5383 Other fatigue: Secondary | ICD-10-CM

## 2019-08-25 DIAGNOSIS — Z79899 Other long term (current) drug therapy: Secondary | ICD-10-CM

## 2019-08-25 DIAGNOSIS — I10 Essential (primary) hypertension: Secondary | ICD-10-CM

## 2019-08-25 DIAGNOSIS — E785 Hyperlipidemia, unspecified: Secondary | ICD-10-CM

## 2019-08-25 DIAGNOSIS — F411 Generalized anxiety disorder: Secondary | ICD-10-CM

## 2019-08-25 MED ORDER — METOPROLOL TARTRATE 50 MG PO TABS: 25.0000 mg | ORAL_TABLET | Freq: Two times a day (BID) | ORAL | 1 refills | Status: DC

## 2019-08-25 MED ORDER — ENALAPRIL MALEATE 10 MG PO TABS: ORAL_TABLET | ORAL | 1 refills | Status: DC

## 2019-08-25 MED FILL — ?METOPROLOL 50 MG TABLET: 50 | 30 days supply | Qty: 30 | Fill #0

## 2019-08-25 MED FILL — ENALAPRIL MALEATE 10 MG TAB: 10 | 30 days supply | Qty: 90 | Fill #0

## 2019-08-25 NOTE — Progress Notes (Signed)
Established Patient Office Visit  Subjective:  Patient ID: Theresa [name], female    DOB: Mar 28, 1946  Age: 74 y.o. MRN: [id number]  CC:  Chief Complaint  Patient presents with  . Follow-up    HPI Theresa [name], 74 year old female seen in follow-up of hypertension and hyperlipidemia, and she is also currently being seen by hematology due to issues with thrombocytopenia.  She denies any unusual bruising or bleeding at this time.  She has had some fatigue.  She reports that she has followed up with cardiology regarding her blood pressure and she is taking her blood pressure medication, metoprolol and lisinopril as prescribed.  She states that her home blood pressure is generally in the 140s to 160s over 80s to 90s.  She has occasional headaches, she denies any dizziness, no episodes of focal numbness or weakness or slurred speech.  She has had no syncopal episodes.  No chest pain and no current palpitations.  She reports however that she recently received bad news that her nephew had liver cancer as her sister had sent her pictures of blood that her nephew had thrown up prior to being hospitalized.  Patient states that she was very upset about her nephew's health and believes she did have some palpitations at that time.  She reports that she took the previously prescribed alprazolam which did help her feel better.  She would like a refill of alprazolam that she can take as needed when she feels that she is very upset or when she is very anxious such as when she goes to have procedures with the dentist or eye doctor.        She reports that she is following a healthy diet along with taking her blood pressure medication and medicine for her cholesterol.  She thinks that overall she is well at this time.  She does often feel tired.  She denies any increased thirst or urinary frequency.  No dysuria.  No abdominal pain-no nausea/vomiting/diarrhea or constipation.  No issues with swelling in her lower  legs.  No shortness of breath or cough.  She reports that in the past she was told that she had worms in her system and she takes the medication every 6 months to make sure that they do not return.  She denies any loss of appetite and no abdominal pain.  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  Past Medical History:  Diagnosis Date  . Gastric ulcer   . Hyperlipidemia   . Hypertension   . River blindness   . Sinusitis     Past Surgical History:  Procedure Laterality Date  . APPENDECTOMY    . BREAST LUMPECTOMY      Family History  Problem Relation Age of Onset  . Heart disease Mother   . Hypertension Mother   . Cancer Brother     Social History   Socioeconomic History  . Marital status: Widowed    Spouse name: Not on file  . Number of children: 2  . Years of education: Not on file  . Highest education level: Not on file  Occupational History  . Not on file  Tobacco Use  . Smoking status: Never Smoker  . Smokeless tobacco: Never Used  Substance and Sexual Activity  . Alcohol use: No  . Drug use: No  . Sexual activity: Not Currently    Birth control/protection: Post-menopausal  Other Topics Concern  . Not on file  Social History Narrative  . Not on file  Social Determinants of Health   Financial Resource Strain:   . Difficulty of Paying Living Expenses: Not on file  Food Insecurity:   . Worried About Charity fundraiser in the Last Year: Not on file  . Ran Out of Food in the Last Year: Not on file  Transportation Needs:   . Lack of Transportation (Medical): Not on file  . Lack of Transportation (Non-Medical): Not on file  Physical Activity:   . Days of Exercise per Week: Not on file  . Minutes of Exercise per Session: Not on file  Stress:   . Feeling of Stress : Not on file  Social Connections:   . Frequency of Communication with Friends and Family: Not on file  . Frequency of Social Gatherings with Friends and Family: Not on file  . Attends Religious  Services: Not on file  . Active Member of Clubs or Organizations: Not on file  . Attends Archivist Meetings: Not on file  . Marital Status: Not on file  Intimate Partner Violence:   . Fear of Current or Ex-Partner: Not on file  . Emotionally Abused: Not on file  . Physically Abused: Not on file  . Sexually Abused: Not on file    Outpatient Medications Prior to Visit  Medication Sig Dispense Refill  . acetaminophen (TYLENOL) 500 MG tablet Take 500 mg by mouth every 6 (six) hours as needed for mild pain or moderate pain.    Marland Kitchen enalapril (VASOTEC) 10 MG tablet Take 1 tablet (10 mg total) by mouth every morning AND 2 tablets (20 mg total) at bedtime. 90 tablet 1  . folic acid (FOLVITE) A999333 MCG tablet Take by mouth.    . IVERMECTIN PO Take 3 mg by mouth.    . metoprolol tartrate (LOPRESSOR) 50 MG tablet Take 0.5 tablets (25 mg total) by mouth 2 (two) times daily. 180 tablet 3  . Multiple Vitamin (MULTIVITAMIN) tablet Take 1 tablet by mouth daily.    . rosuvastatin (CRESTOR) 5 MG tablet Take 1 tablet (5 mg total) by mouth daily. 90 tablet 3  . ALPRAZolam (XANAX) 0.25 MG tablet Take 1 or 2 tablets by mouth about 30 minutes prior to procedures (Patient not taking: Reported on 08/25/2019) 10 tablet 0  . clonazePAM (KLONOPIN) 0.5 MG tablet Take 1/2 pill twice daily as needed for anxiety (Patient not taking: Reported on 08/25/2019) 30 tablet 1   No facility-administered medications prior to visit.    Allergies  Allergen Reactions  . Amlodipine Swelling  . Coreg [Carvedilol] Other (See Comments)    Drowsiness, dizziness, palpitations  . Shellfish Allergy Swelling  . Trazodone And Nefazodone Swelling    Possible eye pain and swelling  . Hydralazine Hcl Palpitations  . Augmentin [Amoxicillin-Pot Clavulanate]   . Clonidine Derivatives Other (See Comments)    "heart came out of chest"  . Lisinopril Palpitations  . Lisinopril-Hydrochlorothiazide Other (See Comments)    Possible  flushing, insomnia    ROS Review of Systems  Constitutional: Positive for fatigue. Negative for chills and fever.  HENT: Negative for sore throat and trouble swallowing.   Respiratory: Negative for cough and shortness of breath.   Cardiovascular: Negative for chest pain, palpitations and leg swelling.  Gastrointestinal: Negative for abdominal pain, blood in stool, constipation, diarrhea and nausea.  Endocrine: Negative for cold intolerance, heat intolerance, polydipsia, polyphagia and polyuria.  Genitourinary: Negative for dysuria and frequency.  Musculoskeletal: Negative for arthralgias and back pain.  Neurological: Positive for headaches (Occasional).  Negative for dizziness.  Hematological: Negative for adenopathy. Does not bruise/bleed easily.  Psychiatric/Behavioral: Negative for self-injury and suicidal ideas. The patient is nervous/anxious.       Objective:    Physical Exam  Constitutional: She is oriented to person, place, and time. She appears well-developed and well-nourished.  Neck: No JVD present. No thyromegaly present.  Cardiovascular: Normal rate and regular rhythm.  Pulmonary/Chest: Effort normal and breath sounds normal.  Abdominal: Soft. There is no abdominal tenderness. There is no rebound and no guarding.  Musculoskeletal:        General: No edema.     Cervical back: Normal range of motion and neck supple.  Lymphadenopathy:    She has no cervical adenopathy.  Neurological: She is alert and oriented to person, place, and time.  Skin: Skin is warm and dry.  Psychiatric: Her behavior is normal.  Slightly anxious at times  Nursing note and vitals reviewed.   BP (!) 212/76 (BP Location: Left Arm, Patient Position: Sitting, Cuff Size: Normal)   Pulse 82   Temp (!) 97.2 F (36.2 C) (Oral)   Ht 5\' 4"  (1.626 m)   Wt 175 lb (79.4 kg)   SpO2 99%   BMI 30.04 kg/m  Wt Readings from Last 3 Encounters:  08/25/19 175 lb (79.4 kg)  07/18/19 173 lb 3.2 oz (78.6 kg)    06/30/19 172 lb 9.6 oz (78.3 kg)       Lab Results  Component Value Date   TSH 1.46 10/02/2015   Lab Results  Component Value Date   WBC 6.2 06/13/2019   HGB 13.0 06/13/2019   HCT 38.7 06/13/2019   MCV 85.4 06/13/2019   PLT 101 (L) 06/13/2019   Lab Results  Component Value Date   NA 140 06/13/2019   K 4.0 06/13/2019   CO2 25 06/13/2019   GLUCOSE 123 (H) 06/13/2019   BUN 16 06/13/2019   CREATININE 1.11 (H) 06/13/2019   BILITOT 0.3 06/13/2019   ALKPHOS 75 06/13/2019   AST 16 06/13/2019   ALT 12 06/13/2019   PROT 8.0 06/13/2019   ALBUMIN 4.2 06/13/2019   CALCIUM 9.2 06/13/2019   ANIONGAP 12 06/13/2019   Lab Results  Component Value Date   CHOL 243 (H) 12/31/2017   Lab Results  Component Value Date   HDL 84 12/31/2017   Lab Results  Component Value Date   LDLCALC 146 (H) 12/31/2017   Lab Results  Component Value Date   TRIG 64 12/31/2017   Lab Results  Component Value Date   CHOLHDL 2.9 12/31/2017   Lab Results  Component Value Date   HGBA1C 5.2 10/11/2018      Assessment & Plan:  1. Hypertension, uncontrolled; 4.  Whitecoat syndrome with hypertension Patient's blood pressure is highly elevated at today's visit which is consistent with past in office blood pressure readings.  She believes that she has whitecoat hypertension as she reports that her home blood pressures are generally much better than the blood pressures that are recorded at medical office visits.  She has been seeing cardiology in follow-up of her hypertension.  At her most recent cardiology visit on 07/18/2019, blood pressure was elevated at 190/102.  She is currently on lisinopril and metoprolol as she has reported multiple intolerances to other medications in the past.  I also believe that there is a strong component of anxiety to her elevated blood pressures but patient has refused to be on medication on a daily basis such as Klonopin to  help reduce her overall anxiety.  She has taken  alprazolam prior to procedures and this has actually helped with her blood pressure at these times.  She would like a new prescription for alprazolam to take as needed prior to procedures.  She has been asked to return for fasting lipid panel. - Lipid Panel; Future  2. Hyperlipidemia, unspecified hyperlipidemia type Patient is supposed to be on rosuvastatin 10 mg daily for control of her blood pressure however I suspect that she may not be taking the medication as our review of chart, she expressed to other specialist that she was afraid that the cholesterol medication may be contributing to her issues with thrombocytopenia for which she was recently evaluated by hematology.  She was given reassurance by hematology that her platelets improved and that her low platelet count was likely not related to her current medications.  Patient has been asked to return for comprehensive metabolic panel in follow-up of medication use as well as repeat lipid panel in follow-up of hyperlipidemia. - Comprehensive metabolic panel; Future - Lipid Panel; Future  3. Encounter for long-term (current) use of medications She is to schedule fasting lab visit for comprehensive metabolic panel in follow-up of long-term use of medications for treatment of hypertension and hyperlipidemia - Comprehensive metabolic panel; Future  5. GAD (generalized anxiety disorder) Patient with generalized anxiety disorder and reports recent increased anxiety as her nephew was recently diagnosed with a liver disorder.  Patient does not wish to take any daily medication such as sertraline/Lexapro and does not wish to try use of Klonopin to help with her anxiety.  I discussed with the patient that I believe that a large component of her high blood pressures is related to her chronic issues with anxiety.  She does request a prescription for alprazolam which she was prescribed in the past to take as needed prior to procedures as she does feel that  this medication helped.  She also declines referral to social worker for other evaluation of her anxiety.  6. Other fatigue She reports continued issues with fatigue.  She has been asked to return for lab visit at which time she will have TSH to look for thyroid disorder, vitamin D level to look for vitamin D deficiency as well as hemoglobin A1c to look for possible elevated blood sugar which could be contributing to her fatigue. - TSH; Future - Vitamin D, 25-hydroxy; Future - Hemoglobin A1c; Future   An After Visit Summary was printed and given to the patient.   Follow-up: Return in 3 months (on 11/23/2019) for chronic issues; schedule fasting labs.   Antony Blackbird, MD

## 2019-08-25 NOTE — Progress Notes (Signed)
Pt. Is here for hypertension follow up.

## 2019-08-30 ENCOUNTER — Ambulatory Visit: Payer: Self-pay | Attending: Family Medicine

## 2019-08-30 ENCOUNTER — Other Ambulatory Visit: Payer: Self-pay

## 2019-08-30 DIAGNOSIS — R5383 Other fatigue: Secondary | ICD-10-CM

## 2019-08-30 DIAGNOSIS — I10 Essential (primary) hypertension: Secondary | ICD-10-CM

## 2019-08-30 DIAGNOSIS — E785 Hyperlipidemia, unspecified: Secondary | ICD-10-CM

## 2019-08-30 DIAGNOSIS — Z79899 Other long term (current) drug therapy: Secondary | ICD-10-CM

## 2019-08-31 ENCOUNTER — Telehealth (INDEPENDENT_AMBULATORY_CARE_PROVIDER_SITE_OTHER): Payer: Self-pay

## 2019-08-31 ENCOUNTER — Encounter (INDEPENDENT_AMBULATORY_CARE_PROVIDER_SITE_OTHER): Payer: Self-pay

## 2019-08-31 LAB — COMPREHENSIVE METABOLIC PANEL WITH GFR
ALT: 6 IU/L (ref 0–32)
AST: 13 IU/L (ref 0–40)
Albumin/Globulin Ratio: 1.4 (ref 1.2–2.2)
Albumin: 4.4 g/dL (ref 3.7–4.7)
Alkaline Phosphatase: 69 IU/L (ref 39–117)
BUN/Creatinine Ratio: 14 (ref 12–28)
BUN: 12 mg/dL (ref 8–27)
Bilirubin Total: 0.4 mg/dL (ref 0.0–1.2)
CO2: 27 mmol/L (ref 20–29)
Calcium: 9.5 mg/dL (ref 8.7–10.3)
Chloride: 104 mmol/L (ref 96–106)
Creatinine, Ser: 0.88 mg/dL (ref 0.57–1.00)
GFR calc Af Amer: 74 mL/min/1.73
GFR calc non Af Amer: 64 mL/min/1.73
Globulin, Total: 3.2 g/dL (ref 1.5–4.5)
Glucose: 98 mg/dL (ref 65–99)
Potassium: 4.4 mmol/L (ref 3.5–5.2)
Sodium: 142 mmol/L (ref 134–144)
Total Protein: 7.6 g/dL (ref 6.0–8.5)

## 2019-08-31 LAB — LIPID PANEL
Chol/HDL Ratio: 3 ratio (ref 0.0–4.4)
Cholesterol, Total: 251 mg/dL — ABNORMAL HIGH (ref 100–199)
HDL: 83 mg/dL
LDL Chol Calc (NIH): 157 mg/dL — ABNORMAL HIGH (ref 0–99)
Triglycerides: 69 mg/dL (ref 0–149)
VLDL Cholesterol Cal: 11 mg/dL (ref 5–40)

## 2019-08-31 LAB — HEMOGLOBIN A1C
Est. average glucose Bld gHb Est-mCnc: 114 mg/dL
Hgb A1c MFr Bld: 5.6 % (ref 4.8–5.6)

## 2019-08-31 LAB — TSH: TSH: 2.88 u[IU]/mL (ref 0.450–4.500)

## 2019-08-31 LAB — VITAMIN D 25 HYDROXY (VIT D DEFICIENCY, FRACTURES): Vit D, 25-Hydroxy: 22.4 ng/mL — ABNORMAL LOW (ref 30.0–100.0)

## 2019-08-31 NOTE — Telephone Encounter (Signed)
After verifying date of birth patient was notified of results per C. Fulp PCP. She will take OTC vitamin D3. She currently has 2000 Iu's. Advised her to get 1000 Iu's per PCP. She verbalized understanding. Results printed and mailed to patient per her request. Nat Christen, CMA

## 2019-08-31 NOTE — Telephone Encounter (Signed)
-----   Message from Antony Blackbird, MD sent at 08/31/2019  8:40 AM EST ----- Normal Hgb A1c- blood sugars have been on average, normal over the past 90 days. Vitamin D level is low at 22.4. A prescription can be sent to pharmacy for a once weekly Vitamin D supplement or she can take otc Vitamin D3 of 1,000 Iu's daily. Normal TSH. Total cholesterol is elevated at 251 and LDL of 157. Electrolytes and liver enzymes are normal on CMET. Will also forward to Dr. Martinique in cardiology.

## 2019-09-05 ENCOUNTER — Other Ambulatory Visit: Payer: Self-pay

## 2019-09-05 DIAGNOSIS — E785 Hyperlipidemia, unspecified: Secondary | ICD-10-CM

## 2019-09-05 DIAGNOSIS — I10 Essential (primary) hypertension: Secondary | ICD-10-CM

## 2019-09-05 MED ORDER — ROSUVASTATIN CALCIUM 20 MG PO TABS: 20.0000 mg | ORAL_TABLET | Freq: Every day | ORAL | 3 refills | Status: DC

## 2019-09-06 MED FILL — ROSUVASTATIN CALCIUM 20 MG: 20 | 90 days supply | Qty: 90 | Fill #0

## 2019-09-13 ENCOUNTER — Telehealth: Payer: Self-pay | Admitting: Cardiology

## 2019-09-13 NOTE — Telephone Encounter (Signed)
Lab slips mailed to patient. Patient made aware.

## 2019-09-13 NOTE — Telephone Encounter (Signed)
Patient may need paper orders for her lab work to be done at the wellness clinic. She can not afford to have them done at our office and can get them done for free at the wellness clinic. Please send her paper orders in the mail.

## 2019-09-20 ENCOUNTER — Ambulatory Visit: Payer: No Typology Code available for payment source | Admitting: Hematology and Oncology

## 2019-09-20 ENCOUNTER — Other Ambulatory Visit: Payer: No Typology Code available for payment source

## 2019-09-21 ENCOUNTER — Encounter: Payer: Self-pay | Admitting: Adult Health

## 2019-09-21 ENCOUNTER — Other Ambulatory Visit: Payer: Self-pay

## 2019-09-21 ENCOUNTER — Ambulatory Visit (INDEPENDENT_AMBULATORY_CARE_PROVIDER_SITE_OTHER): Payer: No Typology Code available for payment source | Admitting: Adult Health

## 2019-09-21 ENCOUNTER — Other Ambulatory Visit: Payer: Self-pay | Admitting: *Deleted

## 2019-09-21 VITALS — BP 198/90 | HR 76 | Ht 64.0 in | Wt 173.6 lb

## 2019-09-21 DIAGNOSIS — E785 Hyperlipidemia, unspecified: Secondary | ICD-10-CM

## 2019-09-21 DIAGNOSIS — I10 Essential (primary) hypertension: Secondary | ICD-10-CM

## 2019-09-21 DIAGNOSIS — F419 Anxiety disorder, unspecified: Secondary | ICD-10-CM

## 2019-09-21 DIAGNOSIS — D696 Thrombocytopenia, unspecified: Secondary | ICD-10-CM

## 2019-09-21 MED ORDER — ENALAPRIL MALEATE 20 MG PO TABS: ORAL_TABLET | ORAL | 3 refills | Status: DC

## 2019-09-21 MED ORDER — HYDROCHLOROTHIAZIDE 12.5 MG PO CAPS: 12.5000 mg | ORAL_CAPSULE | Freq: Every day | ORAL | 3 refills | Status: DC

## 2019-09-21 MED ORDER — ENALAPRIL MALEATE 10 MG PO TABS: ORAL_TABLET | ORAL | 3 refills | Status: DC

## 2019-09-21 NOTE — Patient Instructions (Addendum)
Medication Instructions:  INCREASE- Enalapril 10 mg take 20 in the morning and 10 mg(1/2 tablet) in the evening START- Hydrochlorothiazide 12.5 mg by mouth daily  *If you need a refill on your cardiac medications before your next appointment, please call your pharmacy*  Lab Work: BMP in 2 weeks  If you have labs (blood work) drawn today and your tests are completely normal, you will receive your results only by: Marland Kitchen MyChart Message (if you have MyChart) OR . A paper copy in the mail If you have any lab test that is abnormal or we need to change your treatment, we will call you to review the results.  Testing/Procedures: None Ordered  Follow-Up: At [facility], you and your health needs are our priority.  As part of our continuing mission to provide you with exceptional heart care, we have created designated Provider Care Teams.  These Care Teams include your primary Cardiologist (physician) and Advanced Practice Providers (APPs -  Physician Assistants and Nurse Practitioners) who all work together to provide you with the care you need, when you need it.  Your next appointment:   1 month(s)  The format for your next appointment:   In Person  Provider:   Jory Sims, DNP, ANP

## 2019-09-21 NOTE — Addendum Note (Signed)
Addended by: Vennie Homans on: 09/21/2019 04:12 PM   Modules accepted: Orders

## 2019-09-21 NOTE — Progress Notes (Signed)
Patient Care Team: Antony Blackbird, MD as PCP - General (Family Medicine)  DIAGNOSIS:    ICD-10-CM   1. Thrombocytopenia ([place])  D69.6      CHIEF COMPLIANT: Follow-up of thrombocytopenia  INTERVAL HISTORY: Theresa [name] is a 74 y.o. with above-mentioned history of thrombocytopenia. She presents to the clinic today for follow-up.   She informed me that her blood pressure has been significantly high and her primary care physician has been adjusting her blood pressure medications.  Today her blood pressure reading was 203 x 82.  She tells me that her nephew had died this morning and that she is under a lot of stress because of that.  She denies any headaches or lightheadedness or blurred vision.  ALLERGIES:  is allergic to amlodipine; coreg [carvedilol]; shellfish allergy; trazodone and nefazodone; hydralazine hcl; augmentin [amoxicillin-pot clavulanate]; clonidine derivatives; lisinopril; and lisinopril-hydrochlorothiazide.  MEDICATIONS:  Current Outpatient Medications  Medication Sig Dispense Refill  . acetaminophen (TYLENOL) 500 MG tablet Take 500 mg by mouth every 6 (six) hours as needed for mild pain or moderate pain.    Marland Kitchen ALPRAZolam (XANAX) 0.25 MG tablet Take 1 or 2 tablets by mouth about 30 minutes prior to procedures 10 tablet 0  . enalapril (VASOTEC) 10 MG tablet Take 2 tablets (20 mg total) by mouth every morning AND 1 tablet (10 mg total) at bedtime. A999333 tablet 3  . folic acid (FOLVITE) A999333 MCG tablet Take by mouth.    . hydrochlorothiazide (MICROZIDE) 12.5 MG capsule Take 1 capsule (12.5 mg total) by mouth daily. 90 capsule 3  . IVERMECTIN PO Take 3 mg by mouth.    . metoprolol tartrate (LOPRESSOR) 50 MG tablet Take 0.5 tablets (25 mg total) by mouth 2 (two) times daily. 180 tablet 1  . Multiple Vitamin (MULTIVITAMIN) tablet Take 1 tablet by mouth daily.    . rosuvastatin (CRESTOR) 20 MG tablet Take 1 tablet (20 mg total) by mouth daily. 90 tablet 3   No current  facility-administered medications for this visit.    PHYSICAL EXAMINATION: ECOG PERFORMANCE STATUS: 1 - Symptomatic but completely ambulatory  Vitals:   09/22/19 0845  BP: (!) 203/82  Pulse: 71  Resp: 18  Temp: (!) 97.4 F (36.3 C)  SpO2: 100%   Filed Weights   09/22/19 0845  Weight: 173 lb 6.4 oz (78.7 kg)    LABORATORY DATA:  I have reviewed the data as listed CMP Latest Ref Rng & Units 08/30/2019 06/13/2019 05/12/2019  Glucose 65 - 99 mg/dL 98 123(H) 110(H)  BUN 8 - 27 mg/dL 12 16 17   Creatinine 0.57 - 1.00 mg/dL 0.88 1.11(H) 0.98  Sodium 134 - 144 mmol/L 142 140 141  Potassium 3.5 - 5.2 mmol/L 4.4 4.0 4.2  Chloride 96 - 106 mmol/L 104 103 103  CO2 20 - 29 mmol/L 27 25 26   Calcium 8.7 - 10.3 mg/dL 9.5 9.2 9.2  Total Protein 6.0 - 8.5 g/dL 7.6 8.0 -  Total Bilirubin 0.0 - 1.2 mg/dL 0.4 0.3 -  Alkaline Phos 39 - 117 IU/L 69 75 -  AST 0 - 40 IU/L 13 16 -  ALT 0 - 32 IU/L 6 12 -    Lab Results  Component Value Date   WBC 6.6 09/22/2019   HGB 13.1 09/22/2019   HCT 39.4 09/22/2019   MCV 84.2 09/22/2019   PLT 103 (L) 09/22/2019   NEUTROABS PENDING 09/22/2019    ASSESSMENT & PLAN:  Thrombocytopenia ([place]) Isolated thrombocytopenia:  10/02/2015: Platelet  count: 129 08/16/2018: Platelet count 105 05/12/2019: Platelet count 70 06/13/2019: Platelets 101 with elevated mean platelet count 09/22/2019: Platelets 103  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  (Although in her case, low probability) Ultrasound of the abdomen: Normal spleen  We can continue to watch and monitor this without any treatment. Return to clinic in 6 months with labs and follow-up    No orders of the defined types were placed in this encounter.  The patient has a good understanding of the overall plan. she agrees with it. she will call with any problems that may develop before the next visit here.  Total time spent: 20 mins including face to  face time and time spent for planning, charting and coordination of care  Nicholas Lose, MD 09/22/2019  I, Cloyde Reams Dorshimer, am acting as scribe for Dr. Nicholas Lose.  I have reviewed the above documentation for accuracy and completeness, and I agree with the above.

## 2019-09-21 NOTE — Progress Notes (Signed)
Cardiology Office Note   Date:  09/21/2019   ID:  Theresa [name], DOB 05/09/1946, MRN [id number]  PCP:  Antony Blackbird, MD  Cardiologist: Dr. Martinique  No chief complaint on file.    History of Present Illness: Theresa [name] is a 74 y.o. female who presents for ongoing assessment and management of palpitations and labile blood pressure was long history of poorly controlled hypertension.  She was last seen by Dr. Martinique on 07/18/2019.  She apparently has been tried on several antihypertensive medications which she did not tolerate.  She been tried on spironolactone, hydralazine, clonidine, amlodipine, and metoprolol.   Cardizem was also added but she became bradycardic.  Other history includes hyperlipidemia, thrombocytopenia.  She was also under a lot of psychological stress.  Dr. Martinique was uncertain whether these intolerances were physiologic or psychologic.  On last office visit she was continued on lisinopril and metoprolol, she was counseled on sodium restriction, she was started on rosuvastatin 5 mg daily for hypercholesterolemia.  Palpitations were controlled on metoprolol.  She comes today nervous and hypertensive.  She is also bringing a list with her upper blood pressures at home which have been ranging in the A999333 to A999333 systolic, over [id number] and 0000000 diastolic.  She is very nervous about any medications that she takes.  She also is on Lexapro 0.25 mg as needed for anxiety.  [facility]  She also has labs drawn there as well.  Past Medical History:  Diagnosis Date  . Gastric ulcer   . Hyperlipidemia   . Hypertension   . River blindness   . Sinusitis     Past Surgical History:  Procedure Laterality Date  . APPENDECTOMY    . BREAST LUMPECTOMY       Current Outpatient Medications  Medication Sig Dispense Refill  . acetaminophen (TYLENOL) 500 MG tablet Take 500 mg by mouth every 6 (six) hours as needed for mild pain or  moderate pain.    Marland Kitchen ALPRAZolam (XANAX) 0.25 MG tablet Take 1 or 2 tablets by mouth about 30 minutes prior to procedures 10 tablet 0  . enalapril (VASOTEC) 10 MG tablet Take 1 tablet (10 mg total) by mouth every morning AND 2 tablets (20 mg total) at bedtime. 90 tablet 1  . folic acid (FOLVITE) A999333 MCG tablet Take by mouth.    . IVERMECTIN PO Take 3 mg by mouth.    . metoprolol tartrate (LOPRESSOR) 50 MG tablet Take 0.5 tablets (25 mg total) by mouth 2 (two) times daily. 180 tablet 1  . Multiple Vitamin (MULTIVITAMIN) tablet Take 1 tablet by mouth daily.    . rosuvastatin (CRESTOR) 20 MG tablet Take 1 tablet (20 mg total) by mouth daily. 90 tablet 3   No current facility-administered medications for this visit.    Allergies:   Amlodipine, Coreg [carvedilol], Shellfish allergy, Trazodone and nefazodone, Hydralazine hcl, Augmentin [amoxicillin-pot clavulanate], Clonidine derivatives, Lisinopril, and Lisinopril-hydrochlorothiazide    Social History:  The patient  reports that she has never smoked. She has never used smokeless tobacco. She reports that she does not drink alcohol or use drugs.   Family History:  The patient's family history includes Cancer in her brother; Heart disease in her mother; Hypertension in her mother.    ROS: All other systems are reviewed and negative. Unless otherwise mentioned in H&P    PHYSICAL EXAM: VS:  BP (!) 238/100   Pulse 76   Ht 5\' 4"  (1.626  m)   Wt 173 lb 9.6 oz (78.7 kg)   SpO2 96%   BMI 29.80 kg/m  , BMI Body mass index is 29.8 kg/m. GEN: Well nourished, well developed, in no acute distress HEENT: normal Neck: no JVD, carotid bruits, or masses Cardiac: RRR; S4 murmurs, rubs, or gallops,no edema  Respiratory:  Clear to auscultation bilaterally, normal work of breathing GI: soft, nontender, nondistended, + BS MS: no deformity or atrophy Skin: warm and dry, no rash Neuro:  Strength and sensation are intact Psych: euthymic mood, full  affect   EKG: Not completed this office visit  Recent Labs: 06/13/2019: Hemoglobin 13.0; Platelet Count 101 08/30/2019: ALT 6; BUN 12; Creatinine, Ser 0.88; Potassium 4.4; Sodium 142; TSH 2.880    Lipid Panel    Component Value Date/Time   CHOL 251 (H) 08/30/2019 0850   TRIG 69 08/30/2019 0850   HDL 83 08/30/2019 0850   CHOLHDL 3.0 08/30/2019 0850   CHOLHDL 3.4 06/11/2016 0941   VLDL 13 06/11/2016 0941   LDLCALC 157 (H) 08/30/2019 0850      Wt Readings from Last 3 Encounters:  09/21/19 173 lb 9.6 oz (78.7 kg)  08/25/19 175 lb (79.4 kg)  07/18/19 173 lb 3.2 oz (78.6 kg)      Other studies Reviewed: Abdominal Ultrasound 06/20/2019  FINDINGS: Gallbladder: Normally distended without stones or wall thickening. No pericholecystic fluid or sonographic Murphy sign.  Common bile duct: Diameter: 4 mm, normal  Liver: Normal echogenicity. Small cyst RIGHT lobe 2.1 x 2.1 x 1.7  cm. Additional tiny cyst superiorly RIGHT lobe 8 x 6 x 6 mm. Portal vein is patent on color Doppler imaging with normal direction of blood flow towards the liver.  IVC: Normal appearance  Pancreas: Normal appearance  Spleen: Normal appearance, 6.0 cm length  Right Kidney: Length: 9.9 cm. Normal cortical thickness and echogenicity. Small medial cyst 1.6 x 1.6 x 1.3 cm, simple features. No additional mass or hydronephrosis.  Left Kidney: Length: 11.0 cm. Normal morphology without mass or hydronephrosis.  Abdominal aorta: Normal caliber. Atherosclerotic changes at bifurcation.  Other findings: No free fluid.  IMPRESSION: Small hepatic and RIGHT renal cysts.  Atherosclerotic changes at aortic bifurcation.   ASSESSMENT AND PLAN:  1.  Uncontrolled hypertension: We will change her enalapril 10 mg 2 tablets in the morning and 1 tablet at nighttime to enalapril HCTZ, 20 mg/12.5 mg in the morning and 1/2 tablet in the p.m.  She will have lab work completed in 2 weeks and I will see her  again in 1 month.  I have reinforced the need to take the medicine every day for 1 month.  If she has palpitations or has anxiety related to these medications.  She is to take alprazolam 0.25 mg 1 to 2 tablets as needed.  May need to consider OSA evaluation and she is has uncontrolled hypertension and is intolerant to so many medications.  We will discuss this further on follow-up in 1 month.  2.  Thrombocytopenia: Being followed by heme-on[facility] due to see them tomorrow.  3.  Anxiety: She is on antianxiety medications.  She is to take them as needed she is [facility] for this.  4.  Hyperlipidemia: Continue rosuvastatin 20 mg daily.  Repeat labs are planned in May, 2021.  Current medicines are reviewed at length with the patient today.  I have spent 35 minutes  dedicated to the care of this patient on the date of this encounter to  include pre-visit review of records, assessment, management and diagnostic testing,with shared decision making.  Labs/ tests ordered today include: BMET  Phill Myron. West Pugh, ANP, AACC   09/21/2019 3:28 PM    [facility] [place] [address] Office [phone] Fax [phone]  Notice: This dictation was prepared with Dragon dictation along with smaller phrase technology. Any transcriptional errors that result from this process are unintentional and may not be corrected upon review.

## 2019-09-22 ENCOUNTER — Inpatient Hospital Stay (HOSPITAL_BASED_OUTPATIENT_CLINIC_OR_DEPARTMENT_OTHER): Payer: Self-pay | Admitting: Hematology and Oncology

## 2019-09-22 ENCOUNTER — Other Ambulatory Visit: Payer: Self-pay | Admitting: Adult Health

## 2019-09-22 ENCOUNTER — Telehealth: Payer: Self-pay | Admitting: *Deleted

## 2019-09-22 ENCOUNTER — Other Ambulatory Visit: Payer: Self-pay

## 2019-09-22 ENCOUNTER — Other Ambulatory Visit: Payer: Self-pay | Admitting: Emergency Medicine

## 2019-09-22 ENCOUNTER — Other Ambulatory Visit: Payer: Self-pay | Admitting: Cardiology

## 2019-09-22 ENCOUNTER — Inpatient Hospital Stay: Payer: Self-pay | Attending: Hematology and Oncology

## 2019-09-22 VITALS — BP 203/82 | HR 71 | Temp 97.4°F | Resp 18 | Ht 64.0 in | Wt 173.4 lb

## 2019-09-22 DIAGNOSIS — I10 Essential (primary) hypertension: Secondary | ICD-10-CM

## 2019-09-22 DIAGNOSIS — Z79899 Other long term (current) drug therapy: Secondary | ICD-10-CM | POA: Insufficient documentation

## 2019-09-22 DIAGNOSIS — D696 Thrombocytopenia, unspecified: Secondary | ICD-10-CM | POA: Insufficient documentation

## 2019-09-22 DIAGNOSIS — Z0000 Encounter for general adult medical examination without abnormal findings: Secondary | ICD-10-CM

## 2019-09-22 LAB — CBC WITH DIFFERENTIAL (CANCER CENTER ONLY)
Abs Immature Granulocytes: 0.02 10*3/uL (ref 0.00–0.07)
Basophils Absolute: 0.1 10*3/uL (ref 0.0–0.1)
Basophils Relative: 1 %
Eosinophils Absolute: 0.2 10*3/uL (ref 0.0–0.5)
Eosinophils Relative: 3 %
HCT: 39.4 % (ref 36.0–46.0)
Hemoglobin: 13.1 g/dL (ref 12.0–15.0)
Immature Granulocytes: 0 %
Lymphocytes Relative: 21 %
Lymphs Abs: 1.4 10*3/uL (ref 0.7–4.0)
MCH: 28 pg (ref 26.0–34.0)
MCHC: 33.2 g/dL (ref 30.0–36.0)
MCV: 84.2 fL (ref 80.0–100.0)
Monocytes Absolute: 0.5 10*3/uL (ref 0.1–1.0)
Monocytes Relative: 7 %
Neutro Abs: 4.5 10*3/uL (ref 1.7–7.7)
Neutrophils Relative %: 68 %
Platelet Count: 103 10*3/uL — ABNORMAL LOW (ref 150–400)
RBC: 4.68 MIL/uL (ref 3.87–5.11)
RDW: 13 % (ref 11.5–15.5)
WBC Count: 6.6 10*3/uL (ref 4.0–10.5)
nRBC: 0 % (ref 0.0–0.2)

## 2019-09-22 LAB — CMP (CANCER CENTER ONLY)
ALT: 9 U/L (ref 0–44)
AST: 16 U/L (ref 15–41)
Albumin: 3.9 g/dL (ref 3.5–5.0)
Alkaline Phosphatase: 60 U/L (ref 38–126)
Anion gap: 11 (ref 5–15)
BUN: 15 mg/dL (ref 8–23)
CO2: 28 mmol/L (ref 22–32)
Calcium: 9.4 mg/dL (ref 8.9–10.3)
Chloride: 107 mmol/L (ref 98–111)
Creatinine: 1.04 mg/dL — ABNORMAL HIGH (ref 0.44–1.00)
GFR, Est AFR Am: >60 mL/min (ref >60)
GFR, Estimated: 53 mL/min — ABNORMAL LOW (ref >60)
Glucose, Bld: 121 mg/dL — ABNORMAL HIGH (ref 70–99)
Potassium: 3.9 mmol/L (ref 3.5–5.1)
Sodium: 146 mmol/L — ABNORMAL HIGH (ref 135–145)
Total Bilirubin: 0.5 mg/dL (ref 0.3–1.2)
Total Protein: 7.9 g/dL (ref 6.5–8.1)

## 2019-09-22 LAB — LIPID PANEL
Cholesterol: 186 mg/dL (ref 0–200)
HDL: 69 mg/dL (ref >40)
LDL Cholesterol: 109 mg/dL — ABNORMAL HIGH (ref 0–99)
Total CHOL/HDL Ratio: 2.7 RATIO
Triglycerides: 40 mg/dL (ref <150)
VLDL: 8 mg/dL (ref 0–40)

## 2019-09-22 MED ORDER — ENALAPRIL MALEATE 10 MG PO TABS: ORAL_TABLET | ORAL | 3 refills | Status: DC

## 2019-09-22 MED ORDER — HYDROCHLOROTHIAZIDE 12.5 MG PO CAPS: 12.5000 mg | ORAL_CAPSULE | Freq: Every day | ORAL | 3 refills | Status: DC

## 2019-09-22 MED FILL — HYDROCHLOROTHIAZIDE 12.5 MG: 12.5 | 90 days supply | Qty: 90 | Fill #0

## 2019-09-22 NOTE — Telephone Encounter (Signed)
New Message     *STAT* If patient is at the pharmacy, call can be transferred to refill team.   1. Which medications need to be refilled? (please list name of each medication and dose if known) hydrochlorothiazide (MICROZIDE) 12.5 MG capsule enalapril (VASOTEC) 10 MG tablet  2. Which pharmacy/location (including street and city if local pharmacy) is medication to be sent to? [facility] - [place], [place] - [address]  3. Do they need a 30 day or 90 day supply? [id number]  Pharmacy is calling and says the pt does want these medications filled there and the pharmacist is asking for them to be resent

## 2019-09-22 NOTE — Telephone Encounter (Signed)
Received call from pt sister Roosvelt Harps stating pt is currently with her and is slightly shaking/jittery.  States pt has not eaten anything since 8am this morning.  RN educated pt sister that pt needs to eat because she could be experiencing low blood sugar.  RN also educate pt sister if pt continues to shake, she will need to be seen in the emergency room.  Verbalized understanding.

## 2019-09-22 NOTE — Assessment & Plan Note (Signed)
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  (Although in her case, low probability) Ultrasound of the abdomen: Normal spleen  We can continue to watch and monitor this without any treatment. Return to clinic in 6 months with labs and follow-up

## 2019-09-23 ENCOUNTER — Telehealth: Payer: Self-pay | Admitting: Cardiology

## 2019-09-23 ENCOUNTER — Telehealth: Payer: Self-pay | Admitting: Hematology and Oncology

## 2019-09-23 NOTE — Telephone Encounter (Signed)
I spoke with patient. She reports earlier this month Crestor was increased to 20 mg daily. She reports feeling weak and lack of energy since that time.  She saw Dr Lindi Adie yesterday and reported this so lipid panel was checked. These results are in [place].  Patient would like Dr Martinique to review. She is asking if Crestor can be reduced. She saw Jory Sims, NP earlier this week and HCTZ was started. She took this yesterday and started having palpitations after taking it.  She has not taken HCTZ yet today and is not having any palpitations at the current time.  She is asking if she should continue HCTZ

## 2019-09-23 NOTE — Telephone Encounter (Signed)
New Message   Pt is calling about lab results and her medication concerning her Cholesterol     Please call

## 2019-09-23 NOTE — Telephone Encounter (Signed)
I talk with patient regarding schedule  

## 2019-09-25 NOTE — Telephone Encounter (Signed)
Her cholesterol is still not at goal. LDL 109. She was switched from lisinopril to lisinopril HCT by Jory Sims. She should really stay on meds for at least 2 weeks to allow herself to get adjusted as BP still poorly controlled.  Anzel Kearse Martinique MD, [facility]

## 2019-09-27 NOTE — Telephone Encounter (Signed)
Spoke to patient Dr.Jordan's advice given.Stated she cannot take lisinopril/hctz.Advised to keep appointment as planned with Jory Sims DNP 3/29 at 2:45pm.Advised to check B/P daily and bring readings to appointment.Stated she wanted me to call [place] to let them know to continue her benefits until her appointment.

## 2019-09-29 ENCOUNTER — Telehealth: Payer: Self-pay | Admitting: Cardiology

## 2019-09-29 MED ORDER — ROSUVASTATIN CALCIUM 5 MG PO TABS: 5.0000 mg | ORAL_TABLET | Freq: Every day | ORAL | 3 refills | Status: DC

## 2019-09-29 NOTE — Telephone Encounter (Signed)
[facility] called left a message with eligibly coordinator to call me back.

## 2019-09-29 NOTE — Telephone Encounter (Signed)
   Pt c/o medication issue:  1. Name of Medication: rosuvastatin (CRESTOR) 20 MG tablet  2. How are you currently taking this medication (dosage and times per day)? As written  3. Are you having a reaction (difficulty breathing--STAT)?   4. What is your medication issue? Pt said Dr. Martinique raise her dosage from 5 mg to 20 mg. She said she doesn't like how it feels and her cholesterol is increasing. She asking if she can go back to 5 mg  Please advise

## 2019-09-29 NOTE — Telephone Encounter (Signed)
Spoke with pt, she was feeling tired before but since the increase in medication it has gotten worse. She was given the okay to decrease back to 5 mg. She had lab work drawn on 09-22-19 and would like dr Martinique to review and make any other recommendations. Labs available in chart.

## 2019-09-30 ENCOUNTER — Telehealth: Payer: Self-pay | Admitting: Cardiology

## 2019-09-30 NOTE — Telephone Encounter (Signed)
Spoke with patient. She reports she needs assistance with reapplying for her [employer]. She states it is financial assistance for her appointments and that Malachy Mood has helped her before. Will route to [facility] for review.

## 2019-09-30 NOTE — Telephone Encounter (Signed)
New Message    Pt is calling and is needing to reapply for her orange card. She says is has expired and she is needing assistance with applying. She asks for paperwork to be mailed to her home  Please call back

## 2019-09-30 NOTE — Telephone Encounter (Signed)
Her LDL was 109 on 5 mg Crestor. Crestor was then increased to 20 mg. I would have her try taking 10 mg instead.  Jayla Mackie Martinique MD, [facility]

## 2019-10-03 NOTE — Telephone Encounter (Signed)
Spoke to patient Dr.Jordan advised to take Crestor 10 mg daily.

## 2019-10-05 ENCOUNTER — Telehealth: Payer: Self-pay | Admitting: Cardiology

## 2019-10-05 NOTE — Telephone Encounter (Signed)
Thank you :)

## 2019-10-05 NOTE — Telephone Encounter (Signed)
S/w billing they state that there is nothing we can do to help with her orange card.   Also Dr Martinique has no preference on COVID vaccine just make sure to get it-per nurse.  Pt notified, she states that she needs Ivermectin refill explained to pt that we only fill cardiac medications and to call PCP for refill. She states that she cannot go to their office either d/t insurance issues. She will call and see what they say.  She states that she is doing fine but will need to cancel upcoming appointment d/t orange card insurance issue. Will cancel and forward to Jory Sims to let her know. She states that she will CB to schedule an appointment and will call with any issues.

## 2019-10-05 NOTE — Telephone Encounter (Signed)
Patient would like Dr. Doug Sou advice as to which brand of the COVID vaccine will be better for her to get. She is scheduled to get her 1st dose on 10-24-19. She is scheduled to see Jory Sims the same day but after her appointment for her COVID vaccine.  The patient also needs assistance getting her [employer] card renewed. Please let her know what the office can do to help.

## 2019-10-06 ENCOUNTER — Telehealth: Payer: Self-pay | Admitting: Family Medicine

## 2019-10-06 NOTE — Telephone Encounter (Signed)
Patient called and requested for a paper script to be written for listed medication. Patient stated that her daughter can come pick it up when its ready. Please follow up at your earliest convenience.   IVERMECTIN 3mg 

## 2019-10-06 NOTE — Telephone Encounter (Signed)
Message sent to our social worker Raquel Sarna for assistance.

## 2019-10-06 NOTE — Telephone Encounter (Signed)
Which medications does she needed refilled?

## 2019-10-06 NOTE — Telephone Encounter (Signed)
Message sent to our social worker Ulla Gallo for assistance.

## 2019-10-07 ENCOUNTER — Telehealth: Payer: Self-pay | Admitting: Licensed Clinical Social Worker

## 2019-10-07 ENCOUNTER — Other Ambulatory Visit: Payer: Self-pay | Admitting: Family Medicine

## 2019-10-07 DIAGNOSIS — B7300 Onchocerciasis with eye involvement, unspecified: Secondary | ICD-10-CM

## 2019-10-07 MED ORDER — IVERMECTIN 3 MG PO TABS: 3.0000 mg | ORAL_TABLET | Freq: Once | ORAL | 0 refills | Status: AC

## 2019-10-07 NOTE — Progress Notes (Signed)
Patient ID: Theresa [name], female   DOB: 06-Jan-1946, 74 y.o.   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 In the past, patient has stated that she got this from another provider and takes the medication a few times per year. I will place a referral for patient to be seen by infectious disease to see if she needs to continue the medication or if the infection has cleared.  Rx will be sent to pharmacy listed on chart for invermectin 3 mg to take once every 3 months and will have nurse find out how many years of treatment that patient has had so far and patient will follow-up with ID.

## 2019-10-07 NOTE — Telephone Encounter (Signed)
As I am out of the office today, let patient know that I sent in a [facility] for the [facility] and she can take the 3 mg dose if it has been at least 3 months since she last took this medication. Also please ask how long she has been on this medication. I have placed a referral for her to be seen by ID-infectious disease in follow-up to see if her infection has cleared and if not then to see how many more years of treatment that she will need.

## 2019-10-07 NOTE — Telephone Encounter (Signed)
CSW received referral to assist patient with some financial resources. CSW contacted patient with no answer and message left for return call. Patient appears to have upcoming appointment with financial counseling at [facility] which will evaluate her for possible medicaid and financial assistance. CSW available as needed. Raquel Sarna, [place], [place]

## 2019-10-10 ENCOUNTER — Encounter: Payer: Self-pay | Admitting: Cardiology

## 2019-10-10 ENCOUNTER — Telehealth: Payer: Self-pay | Admitting: Adult Health

## 2019-10-10 ENCOUNTER — Telehealth: Payer: Self-pay | Admitting: Pharmacist

## 2019-10-10 NOTE — Telephone Encounter (Signed)
Returned call to patient she stated she wanted satisfaction survey mailed to her.Advised I will speak to management and let them know.

## 2019-10-10 NOTE — Telephone Encounter (Signed)
Patient called upset wanting to get the  ivermectin (STROMECTOL) 3 MG TABS tablet.  She does not have transportation and states she really needs this please call when ready,

## 2019-10-10 NOTE — Telephone Encounter (Signed)
Ivermectin was ordered by patient's PCP on 10/07/19 but was sent to [facility] pharmacy. We cannot fill this medication.  Patient is requesting that this rx be sent to [facility]. Will route to covering provider for Dr. Chapman Fitch.

## 2019-10-10 NOTE — Telephone Encounter (Signed)
Called pt / Requested to speak with Theresa [name]  for reapplying for her [employer] discount. Message sent to [facility].

## 2019-10-10 NOTE — Telephone Encounter (Signed)
error 

## 2019-10-10 NOTE — Telephone Encounter (Signed)
Called pt unable to reach/ Left voice message to call back. Name and phone nr provided.

## 2019-10-10 NOTE — Telephone Encounter (Signed)
Patient calling stating she has not received her patient satisfaction survey yet. She says it was given through [place], but she does not use a computer and her eyesight is bad so she needs a paper copy.

## 2019-10-10 NOTE — Telephone Encounter (Addendum)
It appears this patient did not have a visit where this prescription was written but had just called in for a refill.  Order notes from PCP indicate referral to infectious disease to determine need for ongoing treatment.  I will defer this to infectious disease for prescription if they feel she needs to remain on it.  Please inform the patient.

## 2019-10-11 NOTE — Telephone Encounter (Signed)
Pt was reschedule a financial appt sooner, Pt was inform

## 2019-10-13 ENCOUNTER — Other Ambulatory Visit: Payer: Self-pay

## 2019-10-13 ENCOUNTER — Ambulatory Visit: Payer: Self-pay | Attending: Family Medicine

## 2019-10-17 MED FILL — LUMIGAN 0.01% EYE DROPS: 0.01 | 18 days supply | Qty: 3 | Fill #1

## 2019-10-24 ENCOUNTER — Ambulatory Visit: Payer: Self-pay | Attending: Internal Medicine

## 2019-10-24 ENCOUNTER — Ambulatory Visit: Payer: No Typology Code available for payment source | Admitting: Adult Health

## 2019-10-24 DIAGNOSIS — Z23 Encounter for immunization: Secondary | ICD-10-CM

## 2019-10-24 NOTE — Progress Notes (Signed)
   Covid-19 Vaccination Clinic  Name:  Theresa [name]    MRN: [id number] DOB: 22-Feb-1946  10/24/2019  [name] was observed post Covid-19 immunization for 15 minutes without incident. She was provided with Vaccine Information Sheet and instruction to access the V-Safe system.   [name] was instructed to call 911 with any severe reactions post vaccine: Marland Kitchen Difficulty breathing  . Swelling of face and throat  . A fast heartbeat  . A bad rash all over body  . Dizziness and weakness   Immunizations Administered    Name Date Dose VIS Date Route   Pfizer COVID-19 Vaccine 10/24/2019 12:56 PM 0.3 mL 07/08/2019 Intramuscular   Manufacturer: [place]   Lot: [id number]   [place]: [id number]

## 2019-10-31 ENCOUNTER — Other Ambulatory Visit: Payer: Self-pay | Admitting: Cardiology

## 2019-10-31 DIAGNOSIS — I10 Essential (primary) hypertension: Secondary | ICD-10-CM

## 2019-10-31 MED ORDER — ENALAPRIL MALEATE 10 MG PO TABS: ORAL_TABLET | ORAL | 0 refills | Status: DC

## 2019-10-31 MED FILL — ENALAPRIL MALEATE 10 MG TAB: 10 | 30 days supply | Qty: 90 | Fill #0

## 2019-10-31 NOTE — Telephone Encounter (Signed)
Left detailed message for pt that we sent in a 30 day refill to [facility], and that she needed to call and make her 1 month f/u appt.

## 2019-10-31 NOTE — Telephone Encounter (Signed)
New Message   *STAT* If patient is at the pharmacy, call can be transferred to refill team.   1. Which medications need to be refilled? (please list name of each medication and dose if known) enalapril (VASOTEC) 10 MG tablet  2. Which pharmacy/location (including street and city if local pharmacy) is medication to be sent to? [place], [place]  3. Do they need a 30 day or 90 day supply? 90 day.  Patient states that she was sent in a 30 day vs a 90 day prescription. Please sent new prescription over to pharmacy.

## 2019-10-31 NOTE — Telephone Encounter (Signed)
*  STAT* If patient is at the pharmacy, call can be transferred to refill team.   1. Which medications need to be refilled? (please list name of each medication and dose if known)  enalapril (VASOTEC) 10 MG tablet  2. Which pharmacy/location (including street and city if local pharmacy) is medication to be sent to? [place], [place]  3. Do they need a 30 day or 90 day supply? 90 day supply  Patient only has one tablet left.

## 2019-11-01 MED ORDER — ENALAPRIL MALEATE 10 MG PO TABS: ORAL_TABLET | ORAL | 1 refills | Status: DC

## 2019-11-01 NOTE — Telephone Encounter (Signed)
Rx request sent to pharmacy.  

## 2019-11-02 ENCOUNTER — Ambulatory Visit: Payer: No Typology Code available for payment source

## 2019-11-03 MED FILL — ENALAPRIL MALEATE 10 MG TAB: 10 | 60 days supply | Qty: 180 | Fill #0

## 2019-11-07 ENCOUNTER — Ambulatory Visit: Payer: No Typology Code available for payment source | Admitting: Infectious Disease

## 2019-11-16 ENCOUNTER — Ambulatory Visit: Payer: No Typology Code available for payment source | Attending: Internal Medicine

## 2019-11-16 DIAGNOSIS — Z23 Encounter for immunization: Secondary | ICD-10-CM

## 2019-11-16 NOTE — Progress Notes (Signed)
   Covid-19 Vaccination Clinic  Name:  Theresa [name]    MRN: [id number] DOB: 28-Feb-1946  11/16/2019  [name] was observed post Covid-19 immunization for 15 minutes without incident. She was provided with Vaccine Information Sheet and instruction to access the V-Safe system.   [name] was instructed to call 911 with any severe reactions post vaccine: Marland Kitchen Difficulty breathing  . Swelling of face and throat  . A fast heartbeat  . A bad rash all over body  . Dizziness and weakness   Immunizations Administered    Name Date Dose VIS Date Route   Pfizer COVID-19 Vaccine 11/16/2019  1:49 PM 0.3 mL 09/21/2018 Intramuscular   Manufacturer: [place]   Lot: [id number]   [place]: [id number]

## 2019-11-22 ENCOUNTER — Other Ambulatory Visit: Payer: Self-pay | Admitting: *Deleted

## 2019-11-23 ENCOUNTER — Telehealth: Payer: Self-pay | Admitting: Family Medicine

## 2019-11-23 ENCOUNTER — Encounter: Payer: Self-pay | Admitting: Family Medicine

## 2019-11-23 ENCOUNTER — Other Ambulatory Visit: Payer: Self-pay

## 2019-11-23 ENCOUNTER — Ambulatory Visit: Payer: No Typology Code available for payment source | Attending: Family Medicine | Admitting: Family Medicine

## 2019-11-23 VITALS — BP 204/98 | HR 69 | Temp 97.9°F | Ht 64.0 in | Wt 174.8 lb

## 2019-11-23 DIAGNOSIS — B7300 Onchocerciasis with eye involvement, unspecified: Secondary | ICD-10-CM

## 2019-11-23 DIAGNOSIS — R42 Dizziness and giddiness: Secondary | ICD-10-CM

## 2019-11-23 DIAGNOSIS — D696 Thrombocytopenia, unspecified: Secondary | ICD-10-CM

## 2019-11-23 DIAGNOSIS — Z79899 Other long term (current) drug therapy: Secondary | ICD-10-CM

## 2019-11-23 DIAGNOSIS — E785 Hyperlipidemia, unspecified: Secondary | ICD-10-CM

## 2019-11-23 DIAGNOSIS — R1013 Epigastric pain: Secondary | ICD-10-CM

## 2019-11-23 DIAGNOSIS — F411 Generalized anxiety disorder: Secondary | ICD-10-CM

## 2019-11-23 DIAGNOSIS — I10 Essential (primary) hypertension: Secondary | ICD-10-CM

## 2019-11-23 MED ORDER — FAMOTIDINE 20 MG PO TABS: 20.0000 mg | ORAL_TABLET | Freq: Two times a day (BID) | ORAL | 5 refills | Status: DC

## 2019-11-23 MED FILL — FAMOTIDINE 20 MG TABS: 20 | 30 days supply | Qty: 60 | Fill #0

## 2019-11-23 NOTE — Progress Notes (Signed)
Established Patient Office Visit  Subjective:  Patient ID: Theresa [name], female    DOB: [birth year]  Age: 74 y.o. MRN: [id number]  CC: No chief complaint on file.   HPI Theresa [name], 74 year old female, who presents for follow-up of chronic medical issues including hypertension which has remained difficult to control and patient has had prior issues with intolerance to multiple medications.  She has seen cardiology in follow-up and states that she is now taking enalapril and metoprolol and she reports that her home blood pressures are lower, in the 140s-150s to 80s - 90s than the blood pressures that she has here at her visits.  She reports occasional mild headaches and occasional dizziness but the dizziness tends to occur when she is feeling anxious/upset.  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  She denies any actual visual disturbances but states that she still feels as if she feels the mother worm and/or larve crawling beneath the skin of her upper arms, especially on the right.  She has been on medication every 4 to 6 months since 2005 for treatment.  She denies any abdominal pain no loss of appetite.  She has seen no worms or larve/eggs in the stool in the past few years.  She denies any fever or chills and no night sweats.  No unexplained weight loss or weight gain.           She has paperwork with her at today's visit indicating that she will have repeat lipid panel and hepatic panel in May with another provider.  Patient reports that she is tolerating Crestor without any difficulty and is currently on the 10 mg dose.  She has noticed some epigastric discomfort on occasion.  She has had no blood in the stool and no black stools.  She denies any current nausea/vomiting/diarrhea or  constipation.  She denies any chest pain and no palpitations.  She reports she did have palpitations with use of hydrochlorothiazide.  She reports prior skin changes to the upper arms with use of her prior cholesterol medication, possibly simvastatin.  She also again notes that she is having changes to the skin of her thighs, right greater than left (patient with chronic spider veins which she has shown to me on prior visits).  Past Medical History:  Diagnosis Date  . Gastric ulcer   . Hyperlipidemia   . Hypertension   . River blindness   . Sinusitis     Past Surgical History:  Procedure Laterality Date  . APPENDECTOMY    . BREAST LUMPECTOMY      Family History  Problem Relation Age of Onset  . Heart disease Mother   . Hypertension Mother   . Cancer Brother     Social History   Socioeconomic History  . Marital status: Widowed    Spouse name: Not on file  . Number of children: 2  . Years of education: Not on file  . Highest education level: Not on file  Occupational History  . Not on file  Tobacco Use  . Smoking status: Never Smoker  . Smokeless tobacco: Never Used  Substance and Sexual Activity  . Alcohol use: No  . Drug use: No  . Sexual activity: Not Currently    Birth control/protection: Post-menopausal  Other Topics Concern  .  Not on file  Social History Narrative  . Not on file   Social Determinants of Health   Financial Resource Strain:   . Difficulty of Paying Living Expenses:   Food Insecurity:   . Worried About Charity fundraiser in the Last Year:   . Arboriculturist in the Last Year:   Transportation Needs:   . Film/video editor (Medical):   Marland Kitchen Lack of Transportation (Non-Medical):   Physical Activity:   . Days of Exercise per Week:   . Minutes of Exercise per Session:   Stress:   . Feeling of Stress :   Social Connections:   . Frequency of Communication with Friends and Family:   . Frequency of Social Gatherings with Friends and Family:    . Attends Religious Services:   . Active Member of Clubs or Organizations:   . Attends Archivist Meetings:   Marland Kitchen Marital Status:   Intimate Partner Violence:   . Fear of Current or Ex-Partner:   . Emotionally Abused:   Marland Kitchen Physically Abused:   . Sexually Abused:     Outpatient Medications Prior to Visit  Medication Sig Dispense Refill  . acetaminophen (TYLENOL) 500 MG tablet Take 500 mg by mouth every 6 (six) hours as needed for mild pain or moderate pain.    Marland Kitchen ALPRAZolam (XANAX) 0.25 MG tablet Take 1 or 2 tablets by mouth about 30 minutes prior to procedures 10 tablet 0  . bimatoprost (LUMIGAN) 0.01 % SOLN Place 1 drop into both eyes nightly.    . enalapril (VASOTEC) 10 MG tablet Take 2 tablets (20 mg total) by mouth every morning AND 1 tablet (10 mg total) at bedtime. 90 tablet 1  . folic acid (FOLVITE) A999333 MCG tablet Take by mouth.    . metoprolol tartrate (LOPRESSOR) 50 MG tablet Take 0.5 tablets (25 mg total) by mouth 2 (two) times daily. 180 tablet 1  . Multiple Vitamin (MULTIVITAMIN) tablet Take 1 tablet by mouth daily.    . rosuvastatin (CRESTOR) 10 MG tablet Take 1 tablet (10 mg total) by mouth daily. 90 tablet 3  . hydrochlorothiazide (MICROZIDE) 12.5 MG capsule Take 1 capsule (12.5 mg total) by mouth daily. 90 capsule 3  . rosuvastatin (CRESTOR) 5 MG tablet Take 5 mg by mouth.     No facility-administered medications prior to visit.    Allergies  Allergen Reactions  . Amlodipine Swelling  . Coreg [Carvedilol] Other (See Comments)    Drowsiness, dizziness, palpitations  . Shellfish Allergy Swelling  . Trazodone And Nefazodone Swelling    Possible eye pain and swelling  . Hydralazine Hcl Palpitations  . Augmentin [Amoxicillin-Pot Clavulanate]   . Clonidine Derivatives Other (See Comments)    "heart came out of chest"  . Lisinopril Palpitations  . Lisinopril-Hydrochlorothiazide Other (See Comments)    Possible flushing, insomnia    ROS Review of Systems    Constitutional: Negative for chills and fever.  HENT: Positive for dental problem (She reports that she can have a tooth extracted but is waiting for oncology to contact oral surgeon once platelets have increased). Negative for sore throat and trouble swallowing.   Eyes: Negative for photophobia and visual disturbance.  Respiratory: Negative for cough and shortness of breath.   Cardiovascular: Negative for chest pain, palpitations and leg swelling.  Gastrointestinal: Positive for abdominal pain (epigastric pain x 4 days). Negative for blood in stool, constipation, diarrhea and nausea.  Endocrine: Negative for polydipsia, polyphagia and polyuria.  Genitourinary: Negative for dysuria and frequency.  Musculoskeletal: Negative for arthralgias and back pain.  Skin: Positive for color change (spider veins on thighs and c/o skin changes on upper arms with past statin use). Negative for rash and wound.  Neurological: Positive for dizziness (occasional). Negative for headaches.  Hematological: Negative for adenopathy. Does not bruise/bleed easily.  Psychiatric/Behavioral: Negative for self-injury and suicidal ideas. The patient is nervous/anxious.       Objective:    Physical Exam  Constitutional: She is oriented to person, place, and time. She appears well-developed and well-nourished.  Well-nourished well-developed elderly female in no acute distress sitting in exam chair.  She is wearing a mask as per office COVID-19 precautions.  Neck: No JVD present. No thyromegaly present.  Cardiovascular: Normal rate and regular rhythm.  Pulmonary/Chest: Effort normal and breath sounds normal.  Abdominal: Soft. There is abdominal tenderness (Mild epigastric discomfort to palpation, no rebound or guarding). There is no rebound and no guarding.  Musculoskeletal:        General: No tenderness or edema.     Cervical back: Normal range of motion and neck supple.  Lymphadenopathy:    She has no cervical  adenopathy.  Neurological: She is alert and oriented to person, place, and time.  Skin: Skin is warm and dry.  Spider veins on the right thigh area  Psychiatric: She has a normal mood and affect. Her behavior is normal.  Nursing note and vitals reviewed.   BP (!) 204/98   Pulse 69   Temp 97.9 F (36.6 C) (Temporal)   Ht 5\' 4"  (1.626 m)   Wt 174 lb 12.8 oz (79.3 kg)   SpO2 99%   BMI 30.00 kg/m  Wt Readings from Last 3 Encounters:  11/23/19 174 lb 12.8 oz (79.3 kg)  09/22/19 173 lb 6.4 oz (78.7 kg)  09/21/19 173 lb 9.6 oz (78.7 kg)     There are no preventive care reminders to display for this patient.   Lab Results  Component Value Date   TSH 2.880 08/30/2019   Lab Results  Component Value Date   WBC 6.6 09/22/2019   HGB 13.1 09/22/2019   HCT 39.4 09/22/2019   MCV 84.2 09/22/2019   PLT 103 (L) 09/22/2019   Lab Results  Component Value Date   NA 146 (H) 09/22/2019   K 3.9 09/22/2019   CO2 28 09/22/2019   GLUCOSE 121 (H) 09/22/2019   BUN 15 09/22/2019   CREATININE 1.04 (H) 09/22/2019   BILITOT 0.5 09/22/2019   ALKPHOS 60 09/22/2019   AST 16 09/22/2019   ALT 9 09/22/2019   PROT 7.9 09/22/2019   ALBUMIN 3.9 09/22/2019   CALCIUM 9.4 09/22/2019   ANIONGAP 11 09/22/2019   Lab Results  Component Value Date   CHOL 186 09/22/2019   Lab Results  Component Value Date   HDL 69 09/22/2019   Lab Results  Component Value Date   LDLCALC 109 (H) 09/22/2019   Lab Results  Component Value Date   TRIG 40 09/22/2019   Lab Results  Component Value Date   CHOLHDL 2.7 09/22/2019   Lab Results  Component Value Date   HGBA1C 5.6 08/30/2019      Assessment & Plan:  1. Hypertension, uncontrolled Patient continues to have uncontrolled hypertension which is worsened by patient's longstanding issues with anxiety.  She is to continue her current medications of enalapril and metoprolol as she reports that she has been able to take these medications without any  untoward side effects as with prior medications  2. Hyperlipidemia, unspecified hyperlipidemia type She reports that she has been able to tolerate Crestor without difficulty and is currently on 10 mg and has follow-up appointment for repeat lipid panel and hepatic panel next month.  She has had good improvement of LDL from approximately 156 down to 109 after being on the Crestor for a few months on review of prior labs  3. GAD (generalized anxiety disorder) Patient with generalized anxiety disorder but does not wish to take any medications on a daily basis.  She tends to have higher blood pressure when she is feeling anxious and she has prescription for Xanax to take on an as-needed basis such as before procedures to help with anxiety which tends to therefore keep her blood pressure lower  4. Epigastric discomfort We will check lipase and patient reluctantly agrees to start the use of Pepcid 20 mg twice daily as she states that her symptoms are usually improved with the use of over-the-counter Tums but she admits that the symptoms return.  She should follow-up in the next few weeks if her symptoms do not improve and seek emergency room follow-up if she has blood in the stool, black stools or severe abdominal pain and/or any other concerns - Lipase - famotidine (PEPCID) 20 MG tablet; Take 1 tablet (20 mg total) by mouth 2 (two) times daily. To decrease stomach acid  Dispense: 60 tablet; Refill: 5  5. Thrombocytopenia ([place]) Continue follow-up with hematology regarding low platelets.  Platelets have increased since her initial hematology visit.  6. Dizziness BMP in follow-up of patient's complaint of dizziness to look for electrolyte abnormality - Basic Metabolic Panel  7. Encounter for long-term (current) use of medications Patient metabolic panel in follow-up of medications for treatment of hypertension as patient has upcoming lab visit for lipid panel and hepatic panel possibly through  cardiology  8. River blindness New referral placed to infectious disease in follow-up of patient's history of filariasis/river blindness - Ambulatory referral to Infectious Disease   Meds ordered this encounter  Medications  . famotidine (PEPCID) 20 MG tablet    Sig: Take 1 tablet (20 mg total) by mouth 2 (two) times daily. To decrease stomach acid    Dispense:  60 tablet    Refill:  5    Follow-up: Return in about 2 months (around 01/23/2020) for chronic issues- sooner if needed.    Greater than 40 minutes of face-to-face time spent with the patient at today's visit with additional 10 minutes for completion of today's note and review of notes from outside specialist  Antony Blackbird, MD

## 2019-11-23 NOTE — Telephone Encounter (Signed)
Patient called saying she took the COVID-19 vaccine with [employer]. 1 dose was 10/24/19 [id number] . Second does 11/16/19 [id number]

## 2019-11-23 NOTE — Progress Notes (Signed)
Chronic issues

## 2019-11-23 NOTE — Telephone Encounter (Signed)
noted 

## 2019-11-24 LAB — BASIC METABOLIC PANEL WITH GFR
BUN/Creatinine Ratio: 15 (ref 12–28)
BUN: 15 mg/dL (ref 8–27)
CO2: 26 mmol/L (ref 20–29)
Calcium: 9.6 mg/dL (ref 8.7–10.3)
Chloride: 105 mmol/L (ref 96–106)
Creatinine, Ser: 0.97 mg/dL (ref 0.57–1.00)
GFR calc Af Amer: 66 mL/min/1.73
GFR calc non Af Amer: 57 mL/min/1.73 — ABNORMAL LOW
Glucose: 93 mg/dL (ref 65–99)
Potassium: 4.5 mmol/L (ref 3.5–5.2)
Sodium: 144 mmol/L (ref 134–144)

## 2019-11-24 LAB — LIPASE: Lipase: 38 U/L (ref 14–85)

## 2019-11-28 ENCOUNTER — Telehealth: Payer: Self-pay | Admitting: Cardiology

## 2019-11-28 NOTE — Telephone Encounter (Signed)
See phone note from 10/10/19. Patient states that she still has not received her paper copy of the patient satisfaction survey. She states she asked twice. Please advise. I have confirmed address.

## 2019-11-28 NOTE — Telephone Encounter (Signed)
Returned call to patient, she states she requested her patient satisfaction survey be mailed to her but she has not received it.   Advised would request this to be mailed again and apologized for the delay.

## 2019-12-02 ENCOUNTER — Telehealth: Payer: Self-pay

## 2019-12-02 NOTE — Telephone Encounter (Signed)
COVID-19 Pre-Screening Questions:12/02/19  Do you currently have a fever (>100 F), chills or unexplained body aches? NO   Are you currently experiencing new cough, shortness of breath, sore throat, runny nose?NO  .  Have you recently travelled outside the state of  in the last 14 days?NO .  Have you been in contact with someone that is currently pending confirmation of Covid19 testing or has been confirmed to have the Covid19 virus?  NO   **If the patient answers NO to ALL questions -  advise the patient to please call the clinic before coming to the office should any symptoms develop.   

## 2019-12-05 ENCOUNTER — Other Ambulatory Visit: Payer: Self-pay

## 2019-12-05 ENCOUNTER — Encounter: Payer: Self-pay | Admitting: Internal Medicine

## 2019-12-05 ENCOUNTER — Ambulatory Visit (INDEPENDENT_AMBULATORY_CARE_PROVIDER_SITE_OTHER): Payer: Self-pay | Admitting: Internal Medicine

## 2019-12-05 DIAGNOSIS — B731 Onchocerciasis without eye disease: Secondary | ICD-10-CM

## 2019-12-05 LAB — HEPATIC FUNCTION PANEL
ALT: 9 IU/L (ref 0–32)
AST: 16 IU/L (ref 0–40)
Albumin: 4.6 g/dL (ref 3.7–4.7)
Alkaline Phosphatase: 66 IU/L (ref 39–117)
Bilirubin Total: 0.4 mg/dL (ref 0.0–1.2)
Bilirubin, Direct: 0.15 mg/dL (ref 0.00–0.40)
Total Protein: 7.5 g/dL (ref 6.0–8.5)

## 2019-12-05 LAB — LIPID PANEL
Chol/HDL Ratio: 2.7 ratio (ref 0.0–4.4)
Cholesterol, Total: 215 mg/dL — ABNORMAL HIGH (ref 100–199)
HDL: 79 mg/dL (ref >39)
LDL Chol Calc (NIH): 124 mg/dL — ABNORMAL HIGH (ref 0–99)
Triglycerides: 69 mg/dL (ref 0–149)
VLDL Cholesterol Cal: 12 mg/dL (ref 5–40)

## 2019-12-06 ENCOUNTER — Encounter: Payer: Self-pay | Admitting: Internal Medicine

## 2019-12-06 NOTE — Progress Notes (Signed)
[facility] for Infectious Disease      Reason for Consult: onchocerciasis    Referring Physician: Dr. Chapman Fitch    Patient ID: Theresa [name], female    DOB: [birth year], 74 y.o.   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  She was diagnosed in [place] in 2005 by a skin snip and has been treated since that time.  Never had any eye issues though is followed for her cataracts by ophthalmology.  She has been treated regularly with ivermectin every 6-12 months.  She reports she feels some parasites under her eye and in other locations but no associated itch or rash.    Past Medical History:  Diagnosis Date  . Gastric ulcer   . Hyperlipidemia   . Hypertension   . River blindness   . Sinusitis     Prior to Admission medications   Medication Sig Start Date End Date Taking? Authorizing Provider  acetaminophen (TYLENOL) 500 MG tablet Take 500 mg by mouth every 6 (six) hours as needed for mild pain or moderate pain.   Yes [provider]  ALPRAZolam Duanne Moron) 0.25 MG tablet Take 1 or 2 tablets by mouth about 30 minutes prior to procedures 07/08/19  Yes Fulp, Cammie, MD  bimatoprost (LUMIGAN) 0.01 % SOLN Place 1 drop into both eyes nightly. 08/16/19  Yes [provider]  Cholecalciferol (VITAMIN D3 PO) Take by mouth.   Yes [provider]  enalapril (VASOTEC) 10 MG tablet Take 2 tablets (20 mg total) by mouth every morning AND 1 tablet (10 mg total) at bedtime. 11/01/19  Yes [place], Peter M, MD  famotidine (PEPCID) 20 MG tablet Take 1 tablet (20 mg total) by mouth 2 (two) times daily. To decrease stomach acid 11/23/19  Yes Fulp, Cammie, MD  folic acid (FOLVITE) A999333 MCG tablet Take by mouth.   Yes [provider]  IVERMECTIN PO Take by mouth. Take every 6 months   Yes [provider]  metoprolol tartrate (LOPRESSOR) 50 MG tablet Take 0.5 tablets (25 mg total) by mouth 2 (two) times daily. 08/25/19 08/19/20  Yes Fulp, Cammie, MD  Multiple Vitamin (MULTIVITAMIN) tablet Take 1 tablet by mouth daily.   Yes [provider]  Multiple Vitamins-Minerals (ZINC PO) Take by mouth.   Yes [provider]  rosuvastatin (CRESTOR) 10 MG tablet Take 1 tablet (10 mg total) by mouth daily. 10/03/19  Yes [place], Peter M, MD    Allergies  Allergen Reactions  . Amlodipine Swelling  . Coreg [Carvedilol] Other (See Comments)    Drowsiness, dizziness, palpitations  . Shellfish Allergy Swelling  . Trazodone And Nefazodone Swelling    Possible eye pain and swelling  . Hydralazine Hcl Palpitations  . Augmentin [Amoxicillin-Pot Clavulanate]   . Clonidine Derivatives Other (See Comments)    "heart came out of chest"  . Lisinopril Palpitations  . Lisinopril-Hydrochlorothiazide Other (See Comments)    Possible flushing, insomnia    Social History   Tobacco Use  . Smoking status: Never Smoker  . Smokeless tobacco: Never Used  Substance Use Topics  . Alcohol use: No  . Drug use: No    Family History  Problem Relation Age of Onset  . Heart disease Mother   . Hypertension Mother   . Cancer Brother     Review of Systems  Constitutional: negative for anorexia and weight loss Eyes: positive for cataracts, negative for irritation Integument/breast: negative for  skin lesion(s) and pruritus All other systems reviewed and are negative    Constitutional: in no apparent distress  Vitals:   12/05/19 1359  Pulse: 91  Temp: 98.4 F (36.9 C)  SpO2: 100%   EYES: anicteric Cardiovascular: Cor RRR Respiratory: clear Skin: negatives: no rash Neuro: non-focal  Labs: Lab Results  Component Value Date   WBC 6.6 09/22/2019   HGB 13.1 09/22/2019   HCT 39.4 09/22/2019   MCV 84.2 09/22/2019   PLT 103 (L) 09/22/2019    Lab Results  Component Value Date   CREATININE 0.97 11/23/2019   BUN 15 11/23/2019   NA 144 11/23/2019   K 4.5 11/23/2019   CL 105 11/23/2019   CO2 26 11/23/2019    Lab  Results  Component Value Date   ALT 9 12/05/2019   AST 16 12/05/2019   ALKPHOS 66 12/05/2019   BILITOT 0.4 12/05/2019     Assessment: she has onchocerciasis but at this point by the history it really seems to be asymptomatic though she endorses feeling of the parasites under the skin.  I would expect though to be some skin reaction if that were the case. Also, it has been over 15 years so the lifespan of the adult would likely be over.  I also discussed the treatment with doxycycline, though this may be better indicated earlier in the disease course.   Regardless, she will take the ivermectin   Plan: 1) take ivermectin 2) return for new symptoms and ivermectin every 6 months as indicated by recurrent symptoms.

## 2019-12-06 NOTE — Telephone Encounter (Signed)
Contacted pt and verified she still has not gotten the [place]. She stated she is still waiting for it. I informed her I have tried to adjust her MyChart settings for this in the future but was unsuccessful but I will share the office of patient experience to get one mailed to her. She was appreciative not additional questions at this time.

## 2019-12-09 ENCOUNTER — Other Ambulatory Visit: Payer: Self-pay

## 2019-12-09 DIAGNOSIS — E785 Hyperlipidemia, unspecified: Secondary | ICD-10-CM

## 2019-12-09 DIAGNOSIS — I10 Essential (primary) hypertension: Secondary | ICD-10-CM

## 2019-12-09 MED ORDER — ROSUVASTATIN CALCIUM 20 MG PO TABS
20.0000 mg | ORAL_TABLET | Freq: Every day | ORAL | 3 refills | Status: DC
Start: 2019-12-09 — End: 2021-06-06

## 2019-12-09 NOTE — Telephone Encounter (Signed)
Patient calling back stating she still has not received her survey.

## 2019-12-16 ENCOUNTER — Telehealth: Payer: Self-pay | Admitting: Hematology and Oncology

## 2019-12-16 NOTE — Telephone Encounter (Signed)
Scheduled appt per 5/21 sch message - pt aware of appt added

## 2019-12-19 ENCOUNTER — Ambulatory Visit: Payer: No Typology Code available for payment source | Admitting: Hematology and Oncology

## 2019-12-20 NOTE — Progress Notes (Signed)
Patient Care Team: Antony Blackbird, MD as PCP - General (Family Medicine)  DIAGNOSIS:    ICD-10-CM   1. Thrombocytopenia ([place])  D69.6     CHIEF COMPLIANT: Follow-up of thrombocytopenia  INTERVAL HISTORY: Theresa [name] is a 74 y.o. with above-mentioned history of thrombocytopenia. She presents to the clinictoday for follow-up.  She received COVID-19 vaccine in April.  She also tells me that the dose of Crestor was increased to 20 mg daily.  Today her platelet count is 88.  She does not have any signs or symptoms of bruising or bleeding.  She has a dental issue which needs to be fixed but it is not bleeding anymore.    ALLERGIES:  is allergic to amlodipine; coreg [carvedilol]; shellfish allergy; trazodone and nefazodone; hydralazine hcl; augmentin [amoxicillin-pot clavulanate]; clonidine derivatives; lisinopril; and lisinopril-hydrochlorothiazide.  MEDICATIONS:  Current Outpatient Medications  Medication Sig Dispense Refill  . acetaminophen (TYLENOL) 500 MG tablet Take 500 mg by mouth every 6 (six) hours as needed for mild pain or moderate pain.    Marland Kitchen ALPRAZolam (XANAX) 0.25 MG tablet Take 1 or 2 tablets by mouth about 30 minutes prior to procedures 10 tablet 0  . bimatoprost (LUMIGAN) 0.01 % SOLN Place 1 drop into both eyes nightly.    . Cholecalciferol (VITAMIN D3 PO) Take by mouth.    . enalapril (VASOTEC) 10 MG tablet Take 2 tablets (20 mg total) by mouth every morning AND 1 tablet (10 mg total) at bedtime. 90 tablet 1  . famotidine (PEPCID) 20 MG tablet Take 1 tablet (20 mg total) by mouth 2 (two) times daily. To decrease stomach acid 60 tablet 5  . folic acid (FOLVITE) [id number] MCG tablet Take by mouth.    . IVERMECTIN PO Take by mouth. Take every 6 months    . metoprolol tartrate (LOPRESSOR) 50 MG tablet Take 0.5 tablets (25 mg total) by mouth 2 (two) times daily. 180 tablet 1  . Multiple Vitamin (MULTIVITAMIN) tablet Take 1 tablet by mouth daily.    . Multiple Vitamins-Minerals (ZINC  PO) Take by mouth.    . rosuvastatin (CRESTOR) 20 MG tablet Take 1 tablet (20 mg total) by mouth daily. 90 tablet 3   No current facility-administered medications for this visit.    PHYSICAL EXAMINATION: ECOG PERFORMANCE STATUS: 1 - Symptomatic but completely ambulatory  Vitals:   12/21/19 1342  BP: (!) 158/82  Pulse: 82  Resp: 18  Temp: 98.9 F (37.2 C)  SpO2: 100%   Filed Weights   12/21/19 1342  Weight: 174 lb 6.4 oz (79.1 kg)    LABORATORY DATA:  I have reviewed the data as listed CMP Latest Ref Rng & Units 12/05/2019 11/23/2019 09/22/2019  Glucose 65 - 99 mg/dL - 93 121(H)  BUN 8 - 27 mg/dL - 15 15  Creatinine 0.57 - 1.00 mg/dL - 0.97 1.04(H)  Sodium 134 - 144 mmol/L - 144 146(H)  Potassium 3.5 - 5.2 mmol/L - 4.5 3.9  Chloride 96 - 106 mmol/L - 105 107  CO2 20 - 29 mmol/L - 26 28  Calcium 8.7 - 10.3 mg/dL - 9.6 9.4  Total Protein 6.0 - 8.5 g/dL 7.5 - 7.9  Total Bilirubin 0.0 - 1.2 mg/dL 0.4 - 0.5  Alkaline Phos 39 - 117 IU/L 66 - 60  AST 0 - 40 IU/L 16 - 16  ALT 0 - 32 IU/L 9 - 9    Lab Results  Component Value Date   WBC 7.1 12/21/2019  HGB 12.6 12/21/2019   HCT 37.7 12/21/2019   MCV 83.4 12/21/2019   PLT 88 (L) 12/21/2019   NEUTROABS 4.6 12/21/2019    ASSESSMENT & PLAN:  Thrombocytopenia (HCC) Isolated thrombocytopenia:  10/02/2015: Platelet count: 129 08/16/2018: Platelet count 105 05/12/2019: Platelet count 70 06/13/2019: Platelets 101 with elevated mean platelet count 09/22/2019: Platelets 103 12/21/2019: Platelet count 88  I discussed with her that Covid vaccines have a tendency to aggravate ITP.  High immature platelet fraction confirms ITP. At this point there is no indication for treatment.     Dental issues: Because of platelets 88, I recommended waiting for improvement in the platelet count before dental surgery unless it is emergent.  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 (Although in  her case, low probability) Ultrasound of the abdomen: Normal spleen  Return to clinic in 3 months with labs and follow-up    No orders of the defined types were placed in this encounter.  The patient has a good understanding of the overall plan. she agrees with it. she will call with any problems that may develop before the next visit here.  Total time spent: 20 mins including face to face time and time spent for planning, charting and coordination of care  Nicholas Lose, MD 12/21/2019  I, Cloyde Reams Dorshimer, am acting as scribe for Dr. Nicholas Lose.  I have reviewed the above documentation for accuracy and completeness, and I agree with the above.

## 2019-12-21 ENCOUNTER — Other Ambulatory Visit: Payer: Self-pay

## 2019-12-21 ENCOUNTER — Telehealth: Payer: Self-pay | Admitting: Oncology

## 2019-12-21 ENCOUNTER — Telehealth: Payer: Self-pay | Admitting: Cardiology

## 2019-12-21 ENCOUNTER — Inpatient Hospital Stay: Payer: Self-pay

## 2019-12-21 ENCOUNTER — Inpatient Hospital Stay
Payer: No Typology Code available for payment source | Attending: Hematology and Oncology | Admitting: Hematology and Oncology

## 2019-12-21 DIAGNOSIS — D696 Thrombocytopenia, unspecified: Secondary | ICD-10-CM

## 2019-12-21 DIAGNOSIS — Z79899 Other long term (current) drug therapy: Secondary | ICD-10-CM | POA: Insufficient documentation

## 2019-12-21 LAB — CBC WITH DIFFERENTIAL (CANCER CENTER ONLY)
Abs Immature Granulocytes: 0.02 10*3/uL (ref 0.00–0.07)
Basophils Absolute: 0.1 10*3/uL (ref 0.0–0.1)
Basophils Relative: 1 %
Eosinophils Absolute: 0.2 10*3/uL (ref 0.0–0.5)
Eosinophils Relative: 2 %
HCT: 37.7 % (ref 36.0–46.0)
Hemoglobin: 12.6 g/dL (ref 12.0–15.0)
Immature Granulocytes: 0 %
Lymphocytes Relative: 25 %
Lymphs Abs: 1.8 10*3/uL (ref 0.7–4.0)
MCH: 27.9 pg (ref 26.0–34.0)
MCHC: 33.4 g/dL (ref 30.0–36.0)
MCV: 83.4 fL (ref 80.0–100.0)
Monocytes Absolute: 0.5 10*3/uL (ref 0.1–1.0)
Monocytes Relative: 8 %
Neutro Abs: 4.6 10*3/uL (ref 1.7–7.7)
Neutrophils Relative %: 64 %
Platelet Count: 88 10*3/uL — ABNORMAL LOW (ref 150–400)
RBC: 4.52 MIL/uL (ref 3.87–5.11)
RDW: 13.2 % (ref 11.5–15.5)
WBC Count: 7.1 10*3/uL (ref 4.0–10.5)
nRBC: 0 % (ref 0.0–0.2)

## 2019-12-21 LAB — IMMATURE PLATELET FRACTION: Immature Platelet Fraction: 41.2 % — ABNORMAL HIGH (ref 1.2–8.6)

## 2019-12-21 NOTE — Telephone Encounter (Signed)
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 This is not related to her Crestor.  Commodore Bellew Martinique MD, [facility]

## 2019-12-21 NOTE — Assessment & Plan Note (Signed)
Isolated thrombocytopenia:  10/02/2015: Platelet count: 129 08/16/2018: Platelet count 105 05/12/2019: Platelet count 70 06/13/2019: Platelets 101 with elevated mean platelet count 09/22/2019: Platelets 103  Probable diagnosis: Low-grade ITP Both Crestor and Diovan can cause mild thrombocytopenia. (Although in her case, low probability) Ultrasound of the abdomen: Normal spleen  Return to clinic in 6 months with labs and follow-up

## 2019-12-21 NOTE — Telephone Encounter (Signed)
Called patient back- she said her medication Crestor was recently changed and increased to 20 mg- she states she went to her oncologist today and they checked her blood work and the platelet count had decreased down to 88. The only change that was made was the Crestor- and she would like to know if this could have caused this to happen, because now she can not have her surgery until her platelet count is higher.   I advised I would route to MD and Nurse.   Thank you!

## 2019-12-21 NOTE — Telephone Encounter (Signed)
Patient would like to speak with Malachy Mood about her lab orders and changing medication.  She  Is concerned with how the increase in medication has lowered her pulse

## 2019-12-21 NOTE — Telephone Encounter (Signed)
Scheduled appts per 5/26 los. Gave pt a print out of AVS.

## 2019-12-21 NOTE — Telephone Encounter (Signed)
Spoke to patient Dr.Jordan's advice given. 

## 2019-12-30 ENCOUNTER — Telehealth: Payer: Self-pay | Admitting: Family Medicine

## 2019-12-30 NOTE — Telephone Encounter (Signed)
Patient called saying that she would like to be prescribed chlorhexidine mouthwash because she has a cavity and her hematologist told her that she has to wait 3 months before she could get her tooth extracted. Please f/u  With patient to see if the medication can be prescribed.

## 2020-01-02 MED ORDER — CHLORHEXIDINE GLUCONATE 0.12 % MT SOLN
15.0000 mL | Freq: Two times a day (BID) | OROMUCOSAL | 0 refills | Status: DC
Start: 2020-01-02 — End: 2021-12-05

## 2020-01-02 NOTE — Telephone Encounter (Signed)
Made pt aware of RX sent to pharmacy.

## 2020-01-02 NOTE — Telephone Encounter (Signed)
I sent an Rx for peridex mouth rinse to our pharmacy.  If they do not have it then [facility]

## 2020-01-09 ENCOUNTER — Other Ambulatory Visit: Payer: Self-pay | Admitting: Cardiology

## 2020-01-09 MED FILL — LUMIGAN 0.01% EYE DROPS: 0.01 | 18 days supply | Qty: 3 | Fill #2

## 2020-01-09 MED FILL — FAMOTIDINE 20 MG TABS: 20 | 30 days supply | Qty: 60 | Fill #1

## 2020-01-11 ENCOUNTER — Other Ambulatory Visit: Payer: Self-pay | Admitting: Adult Health

## 2020-01-11 MED FILL — ?METOPROLOL TART 25MG TABLE: 25 | 30 days supply | Qty: 60 | Fill #0

## 2020-01-23 ENCOUNTER — Ambulatory Visit: Payer: No Typology Code available for payment source | Admitting: Family Medicine

## 2020-01-26 HISTORY — PX: CATARACT EXTRACTION: SUR2

## 2020-02-10 ENCOUNTER — Other Ambulatory Visit: Payer: Self-pay | Admitting: Cardiology

## 2020-02-10 ENCOUNTER — Ambulatory Visit: Payer: No Typology Code available for payment source | Admitting: Internal Medicine

## 2020-02-10 DIAGNOSIS — I10 Essential (primary) hypertension: Secondary | ICD-10-CM

## 2020-02-10 NOTE — Telephone Encounter (Signed)
Rx(s) sent to pharmacy electronically.  

## 2020-02-13 MED FILL — METOPROLOL TARTRATE 25 MG T: 25 | 90 days supply | Qty: 180 | Fill #1

## 2020-02-13 MED FILL — ENALAPRIL MALEATE 10 MG TAB: 10 | 90 days supply | Qty: 270 | Fill #0

## 2020-02-22 MED FILL — ENALAPRIL MALEATE 10 MG TAB: 10 | 90 days supply | Qty: 270 | Fill #0

## 2020-02-23 MED FILL — METOPROLOL TARTRATE 25 MG T: 25 | 90 days supply | Qty: 180 | Fill #1

## 2020-02-24 ENCOUNTER — Ambulatory Visit: Payer: Self-pay | Attending: Family Medicine | Admitting: Internal Medicine

## 2020-02-24 ENCOUNTER — Other Ambulatory Visit: Payer: Self-pay

## 2020-02-24 DIAGNOSIS — D693 Immune thrombocytopenic purpura: Secondary | ICD-10-CM

## 2020-02-24 DIAGNOSIS — B731 Onchocerciasis without eye disease: Secondary | ICD-10-CM

## 2020-02-24 DIAGNOSIS — I10 Essential (primary) hypertension: Secondary | ICD-10-CM

## 2020-02-24 NOTE — Progress Notes (Signed)
Virtual Visit via Telephone Note Due to current restrictions/limitations of in-office visits due to the COVID-19 pandemic, this scheduled clinical appointment was converted to a telehealth visit  I connected with Theresa [name] on 02/24/20 at 8:43 a.m by telephone and verified that I am speaking with the correct person using two identifiers. I am in my office.  The patient is at home.  Only the patient and myself participated in this encounter.  I discussed the limitations, risks, security and privacy concerns of performing an evaluation and management service by telephone and the availability of in person appointments. I also discussed with the patient that there may be a patient responsible charge related to this service. The patient expressed understanding and agreed to proceed.   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 Lindi Adie.  PCP is Dr. Chapman Fitch.  She was last seen 10/2019.  This was to be a follow-up visit with her PCP who is not in office at this time.  Saw Dr. Linus Salmons. 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  Prescribed Ivermectin to take once every 6 mths  Saw Dr. Lindi Adie for the low PLT count.  Dx ITP.  PLT count in range where no treatment needed at this time.  Pt denies bruising or bleeding.  HTN:  BP tends to run high and pt very sensitive to meds.  Several meds tried in past including Lisinopril, Norvasc and Hydralazine had to be d/c due to S.E.  Reports compliance with meds.  Most recent readings were 184/90 yesterday with pulse of 62.  She tells me she was stressed yesterday because they found out her grand-daughter failed her exam and did not tell them.  Last mth BP 136/84 and 146/74 with pulse in the 60s.  BP when she saw Dr. Lindi Adie in May was 158/82.  She is not wanting any additional meds because "too much blood pressure medications make me dizzy and feel bad all day."   Outpatient Encounter  Medications as of 02/24/2020  Medication Sig  . acetaminophen (TYLENOL) 500 MG tablet Take 500 mg by mouth every 6 (six) hours as needed for mild pain or moderate pain.  Marland Kitchen ALPRAZolam (XANAX) 0.25 MG tablet Take 1 or 2 tablets by mouth about 30 minutes prior to procedures  . bimatoprost (LUMIGAN) 0.01 % SOLN Place 1 drop into both eyes nightly.  . chlorhexidine (PERIDEX) 0.12 % solution Use as directed 15 mLs in the mouth or throat 2 (two) times daily.  . Cholecalciferol (VITAMIN D3 PO) Take by mouth.  . enalapril (VASOTEC) 10 MG tablet Take 2 tablets (20 mg total) by mouth every morning AND 1 tablet (10 mg total) at bedtime.  . famotidine (PEPCID) 20 MG tablet Take 1 tablet (20 mg total) by mouth 2 (two) times daily. To decrease stomach acid  . folic acid (FOLVITE) 657 MCG tablet Take by mouth.  . IVERMECTIN PO Take by mouth. Take every 6 months  . metoprolol tartrate (LOPRESSOR) 25 MG tablet Take 1 tablet (25 mg total) by mouth 2 (two) times daily.  . Multiple Vitamin (MULTIVITAMIN) tablet Take 1 tablet by mouth daily.  . Multiple Vitamins-Minerals (ZINC PO) Take by mouth.  . rosuvastatin (CRESTOR) 20 MG tablet Take 1 tablet (20 mg total) by mouth daily.   No facility-administered encounter medications on file as of 02/24/2020.      Observations/Objective: Lab Results  Component Value Date   WBC 7.1 12/21/2019  HGB 12.6 12/21/2019   HCT 37.7 12/21/2019   MCV 83.4 12/21/2019   PLT 88 (L) 12/21/2019     Chemistry      Component Value Date/Time   NA 144 11/23/2019 0945   K 4.5 11/23/2019 0945   CL 105 11/23/2019 0945   CO2 26 11/23/2019 0945   BUN 15 11/23/2019 0945   CREATININE 0.97 11/23/2019 0945   CREATININE 1.04 (H) 09/22/2019 0825   CREATININE 0.90 10/02/2015 1204      Component Value Date/Time   CALCIUM 9.6 11/23/2019 0945   ALKPHOS 66 12/05/2019 0836   AST 16 12/05/2019 0836   AST 16 09/22/2019 0825   ALT 9 12/05/2019 0836   ALT 9 09/22/2019 0825   BILITOT 0.4  12/05/2019 0836   BILITOT 0.5 09/22/2019 0825     Lab Results  Component Value Date   CHOL 215 (H) 12/05/2019   HDL 79 12/05/2019   LDLCALC 124 (H) 12/05/2019   TRIG 69 12/05/2019   CHOLHDL 2.7 12/05/2019     Assessment and Plan: 1. Hypertension, uncontrolled -Patient not interested in having any medication added.  She will continue enalapril and metoprolol.  Advised that she continue to check blood pressure at least once a week and record her readings.  Ideally the goal is 130/80 or lower but given her age systolic blood pressure less than 150 is acceptable.  2. 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  Patient asymptomatic with last platelet count of 88  3. Onchocerciasis Followed by ID.   Follow Up Instructions: 3-4 mths   I discussed the assessment and treatment plan with the patient. The patient was provided an opportunity to ask questions and all were answered. The patient agreed with the plan and demonstrated an understanding of the instructions.   The patient was advised to call back or seek an in-person evaluation if the symptoms worsen or if the condition fails to improve as anticipated.  I provided 14 minutes of non-face-to-face time during this encounter.   Karle Plumber, MD

## 2020-02-27 ENCOUNTER — Telehealth: Payer: Self-pay | Admitting: Family Medicine

## 2020-02-27 NOTE — Telephone Encounter (Signed)
From Dr Durenda Age note she had stated she was not interested in any additional medication. If she has changed her mind I would recommend she schedule an appointment with Dr Wynetta Emery

## 2020-02-27 NOTE — Telephone Encounter (Signed)
FYI   Will forward to covering provider  

## 2020-02-27 NOTE — Telephone Encounter (Signed)
Please advise.  Copied from [place] [phone]. Topic: General - Inquiry >> Feb 24, 2020  9:07 AM Scherrie Gerlach wrote: Reason for CRM:  pt was able to take her blood pressure for her virtual appt.  But now has it and asked me to pass it on.   it is 151/93  p 55

## 2020-02-29 NOTE — Telephone Encounter (Signed)
Pt states she is not interested in no new medication. Pt states the enalapril and metoprolol makes her tired and weak.

## 2020-03-01 ENCOUNTER — Telehealth: Payer: Self-pay | Admitting: Family Medicine

## 2020-03-01 MED FILL — METOPROLOL TARTRATE 25 MG T: 25 | 90 days supply | Qty: 180 | Fill #1

## 2020-03-01 NOTE — Telephone Encounter (Signed)
Please advise.  Copied from [place] [phone]. Topic: General - Other >> Feb 28, 2020 10:09 AM Leward Quan A wrote: Reason for CRM: Patient called to ask Dr Wynetta Emery to please send survey by mail since she have issues with her sight and have to get help from her granddaughter to read and complete. States that the video is hard to see. Also need 90 day refill on her metoprolol tartrate (LOPRESSOR) 25 MG tablet

## 2020-03-05 MED FILL — LUMIGAN 0.01% EYE DROPS: 0.01 | 18 days supply | Qty: 3 | Fill #3

## 2020-03-09 LAB — HEPATIC FUNCTION PANEL
ALT: 11 IU/L (ref 0–32)
AST: 17 IU/L (ref 0–40)
Albumin: 4.4 g/dL (ref 3.7–4.7)
Alkaline Phosphatase: 62 IU/L (ref 48–121)
Bilirubin Total: 0.4 mg/dL (ref 0.0–1.2)
Bilirubin, Direct: 0.14 mg/dL (ref 0.00–0.40)
Total Protein: 7.7 g/dL (ref 6.0–8.5)

## 2020-03-09 LAB — LIPID PANEL
Chol/HDL Ratio: 2.9 ratio (ref 0.0–4.4)
Cholesterol, Total: 221 mg/dL — ABNORMAL HIGH (ref 100–199)
HDL: 75 mg/dL (ref >39)
LDL Chol Calc (NIH): 136 mg/dL — ABNORMAL HIGH (ref 0–99)
Triglycerides: 59 mg/dL (ref 0–149)
VLDL Cholesterol Cal: 10 mg/dL (ref 5–40)

## 2020-03-15 NOTE — Telephone Encounter (Signed)
Pt calling again regarding this survey and is requesting to have a an update. Please advise.

## 2020-03-15 NOTE — Telephone Encounter (Signed)
Returned pt call n pt states he is suppose to do a survey for Dr. Wynetta Emery. I asked pt what survey and pt doesn't know

## 2020-03-16 ENCOUNTER — Other Ambulatory Visit: Payer: Self-pay

## 2020-03-16 DIAGNOSIS — E785 Hyperlipidemia, unspecified: Secondary | ICD-10-CM

## 2020-03-16 NOTE — Progress Notes (Signed)
Lipid

## 2020-03-21 ENCOUNTER — Inpatient Hospital Stay: Payer: No Typology Code available for payment source

## 2020-03-25 NOTE — Progress Notes (Signed)
Patient Care Team: Antony Blackbird, MD as PCP - General (Family Medicine)  DIAGNOSIS:    ICD-10-CM   1. Thrombocytopenia (Theresa [name])  D69.6     CHIEF COMPLIANT: Follow-up ofthrombocytopenia  INTERVAL HISTORY: Theresa [name] is a 74 y.o. with above-mentioned history of thrombocytopenia. She presentsto the clinictoday for follow-up.  She reports no new problems or concerns.  Denies any bruising or bleeding problems.  Her Crestor dose has been increased to 20 mg and she is worried that the Crestor may be causing her thrombocytopenia.  ALLERGIES:  is allergic to amlodipine, coreg [carvedilol], shellfish allergy, trazodone and nefazodone, hydralazine hcl, augmentin [amoxicillin-pot clavulanate], clonidine derivatives, lisinopril, and lisinopril-hydrochlorothiazide.  MEDICATIONS:  Current Outpatient Medications  Medication Sig Dispense Refill  . acetaminophen (TYLENOL) 500 MG tablet Take 500 mg by mouth every 6 (six) hours as needed for mild pain or moderate pain.    Marland Kitchen ALPRAZolam (XANAX) 0.25 MG tablet Take 1 or 2 tablets by mouth about 30 minutes prior to procedures 10 tablet 0  . bimatoprost (LUMIGAN) 0.01 % SOLN Place 1 drop into both eyes nightly.    . chlorhexidine (PERIDEX) 0.12 % solution Use as directed 15 mLs in the mouth or throat 2 (two) times daily. 120 mL 0  . Cholecalciferol (VITAMIN D3 PO) Take by mouth.    . enalapril (VASOTEC) 10 MG tablet Take 2 tablets (20 mg total) by mouth every morning AND 1 tablet (10 mg total) at bedtime. 180 tablet 2  . famotidine (PEPCID) 20 MG tablet Take 1 tablet (20 mg total) by mouth 2 (two) times daily. To decrease stomach acid 60 tablet 5  . folic acid (FOLVITE) 001 MCG tablet Take by mouth.    . IVERMECTIN PO Take by mouth. Take every 6 months    . metoprolol tartrate (LOPRESSOR) 25 MG tablet Take 1 tablet (25 mg total) by mouth 2 (two) times daily. 60 tablet 5  . Multiple Vitamin (MULTIVITAMIN) tablet Take 1 tablet by mouth daily.    . Multiple  Vitamins-Minerals (ZINC PO) Take by mouth.    . rosuvastatin (CRESTOR) 20 MG tablet Take 1 tablet (20 mg total) by mouth daily. 90 tablet 3   No current facility-administered medications for this visit.    PHYSICAL EXAMINATION: ECOG PERFORMANCE STATUS: 1 - Symptomatic but completely ambulatory  Vitals:   03/26/20 1152  BP: (!) 122/108  Pulse: 66  Resp: 17  Temp: 98.1 F (36.7 C)  SpO2: 100%   Filed Weights   03/26/20 1152  Weight: 171 lb 11.2 oz (77.9 kg)    LABORATORY DATA:  I have reviewed the data as listed CMP Latest Ref Rng & Units 03/09/2020 12/05/2019 11/23/2019  Glucose 65 - 99 mg/dL - - 93  BUN 8 - 27 mg/dL - - 15  Creatinine 0.57 - 1.00 mg/dL - - 0.97  Sodium 134 - 144 mmol/L - - 144  Potassium 3.5 - 5.2 mmol/L - - 4.5  Chloride 96 - 106 mmol/L - - 105  CO2 20 - 29 mmol/L - - 26  Calcium 8.7 - 10.3 mg/dL - - 9.6  Total Protein 6.0 - 8.5 g/dL 7.7 7.5 -  Total Bilirubin 0.0 - 1.2 mg/dL 0.4 0.4 -  Alkaline Phos 48 - 121 IU/L 62 66 -  AST 0 - 40 IU/L 17 16 -  ALT 0 - 32 IU/L 11 9 -    Lab Results  Component Value Date   WBC 5.3 03/26/2020   HGB 12.7 03/26/2020  HCT 38.8 03/26/2020   MCV 83.6 03/26/2020   PLT 81 (L) 03/26/2020   NEUTROABS 3.1 03/26/2020    ASSESSMENT & PLAN:  Thrombocytopenia (HCC) Isolated thrombocytopenia:  10/02/2015: Platelet count: 129 08/16/2018: Platelet count 105 05/12/2019: Platelet count 70 06/13/2019: Platelets 101 with elevated mean platelet count 09/22/2019:Platelets 103 12/21/2019: Platelet count 88 03/26/20: Platelet count 81   Probable diagnosis: Low-grade ITP Both Crestor and Diovan can cause mild thrombocytopenia. (Although in her case, low probability) Ultrasound of the abdomen: Normal spleen  Return to clinic in 6 months with labs and follow-up    No orders of the defined types were placed in this encounter.  The patient has a good understanding of the overall plan. she agrees with it. she will call with  any problems that may develop before the next visit here.  Total time spent: 20 mins including face to face time and time spent for planning, charting and coordination of care  Nicholas Lose, MD 03/26/2020  I, Cloyde Reams Dorshimer, am acting as scribe for Dr. Nicholas Lose.  I have reviewed the above documentation for accuracy and completeness, and I agree with the above.

## 2020-03-26 ENCOUNTER — Inpatient Hospital Stay (HOSPITAL_BASED_OUTPATIENT_CLINIC_OR_DEPARTMENT_OTHER): Payer: No Typology Code available for payment source | Admitting: Hematology and Oncology

## 2020-03-26 ENCOUNTER — Other Ambulatory Visit: Payer: Self-pay

## 2020-03-26 ENCOUNTER — Inpatient Hospital Stay: Payer: No Typology Code available for payment source | Attending: Hematology and Oncology

## 2020-03-26 DIAGNOSIS — D696 Thrombocytopenia, unspecified: Secondary | ICD-10-CM

## 2020-03-26 DIAGNOSIS — Z79899 Other long term (current) drug therapy: Secondary | ICD-10-CM | POA: Insufficient documentation

## 2020-03-26 DIAGNOSIS — E785 Hyperlipidemia, unspecified: Secondary | ICD-10-CM

## 2020-03-26 LAB — CBC WITH DIFFERENTIAL (CANCER CENTER ONLY)
Abs Immature Granulocytes: 0 10*3/uL (ref 0.00–0.07)
Basophils Absolute: 0.1 10*3/uL (ref 0.0–0.1)
Basophils Relative: 1 %
Eosinophils Absolute: 0.2 10*3/uL (ref 0.0–0.5)
Eosinophils Relative: 5 %
HCT: 38.8 % (ref 36.0–46.0)
Hemoglobin: 12.7 g/dL (ref 12.0–15.0)
Immature Granulocytes: 0 %
Lymphocytes Relative: 29 %
Lymphs Abs: 1.5 10*3/uL (ref 0.7–4.0)
MCH: 27.4 pg (ref 26.0–34.0)
MCHC: 32.7 g/dL (ref 30.0–36.0)
MCV: 83.6 fL (ref 80.0–100.0)
Monocytes Absolute: 0.4 10*3/uL (ref 0.1–1.0)
Monocytes Relative: 8 %
Neutro Abs: 3.1 10*3/uL (ref 1.7–7.7)
Neutrophils Relative %: 57 %
Platelet Count: 81 10*3/uL — ABNORMAL LOW (ref 150–400)
RBC: 4.64 MIL/uL (ref 3.87–5.11)
RDW: 13.2 % (ref 11.5–15.5)
WBC Count: 5.3 10*3/uL (ref 4.0–10.5)
nRBC: 0 % (ref 0.0–0.2)

## 2020-03-26 NOTE — Assessment & Plan Note (Signed)
Isolated thrombocytopenia:  10/02/2015: Platelet count: 129 08/16/2018: Platelet count 105 05/12/2019: Platelet count 70 06/13/2019: Platelets 101 with elevated mean platelet count 09/22/2019:Platelets 103 12/21/2019: Platelet count 88  Dental issues: Because of platelets 88, I recommended waiting for improvement in the platelet count before dental surgery unless it is emergent.  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 (Although in her case, low probability) Ultrasound of the abdomen: Normal spleen  Return to clinic in 3 months with labs and in 6 months with labs and follow-up

## 2020-03-27 ENCOUNTER — Telehealth: Payer: Self-pay | Admitting: Cardiology

## 2020-03-27 ENCOUNTER — Telehealth: Payer: Self-pay | Admitting: *Deleted

## 2020-03-27 NOTE — Telephone Encounter (Signed)
Follow up:     Patient calling back because she is not feeling well from some medications. Please call patient.

## 2020-03-27 NOTE — Telephone Encounter (Signed)
Patient states she is having neck cramps, especially at night and would like to discuss with a nurse. She is aware that Dr. Martinique and his nurse are off today.

## 2020-03-27 NOTE — Telephone Encounter (Signed)
Spoke with pt, she reports that since the increase in her rosuvastatin she is having leg cramps at night. Instructed patient to reduce dosage back to previous and will let dr Martinique know she can not tolerate the higher dose. Pt agreed with this plan. Will forward to dr Martinique to review and advise.

## 2020-03-27 NOTE — Telephone Encounter (Signed)
Received call from pt requesting recent office notes and vital signs be mailed to her.  RN verified address on file and mailed per pt request.

## 2020-03-27 NOTE — Telephone Encounter (Signed)
OK 

## 2020-03-27 NOTE — Telephone Encounter (Signed)
Spoke with pt, aware okay to take the decreased dose of crestor.

## 2020-03-27 NOTE — Telephone Encounter (Signed)
Patient called about symptoms. She already spoke with Theresa Blades RN today about statin/cramping and was advised to decrease dose of medication. Advised she can work on hydration, eat banana, speak with pharmacist about OTC meds - co-Q10, magnesium

## 2020-04-03 ENCOUNTER — Telehealth: Payer: Self-pay | Admitting: Cardiology

## 2020-04-03 NOTE — Telephone Encounter (Signed)
Spoke to patient she stated she cannot come to office 11/22 for lab work.Advised ok to have lab done 11/19 or 11/22,11/23 or 11/24.

## 2020-04-03 NOTE — Telephone Encounter (Signed)
Theresa [name] is calling requesting to speak with Malachy Mood is regards to lab appointments that were previously being discussed. Please advise.

## 2020-04-20 ENCOUNTER — Telehealth: Payer: Self-pay | Admitting: Internal Medicine

## 2020-04-20 ENCOUNTER — Other Ambulatory Visit: Payer: Self-pay

## 2020-04-20 ENCOUNTER — Ambulatory Visit: Payer: Medicaid Other | Attending: Internal Medicine

## 2020-04-20 NOTE — Telephone Encounter (Signed)
Pt came to the office today to request a new referral for all the specialist she is going since she has Medicaid [employer] with [employer] insurance efft 03/28/2020, please follow up

## 2020-04-23 ENCOUNTER — Other Ambulatory Visit: Payer: Self-pay | Admitting: Family Medicine

## 2020-04-23 ENCOUNTER — Telehealth: Payer: Self-pay | Admitting: Family Medicine

## 2020-04-23 DIAGNOSIS — D696 Thrombocytopenia, unspecified: Secondary | ICD-10-CM

## 2020-04-23 DIAGNOSIS — I10 Essential (primary) hypertension: Secondary | ICD-10-CM

## 2020-04-23 NOTE — Progress Notes (Signed)
Patient ID: Theresa [name], female   DOB: 12/03/1945, 74 y.o.   MRN: [id number]   See phone message. Patient has a new insurance and needs new referrals placed to her current specialists. Referrals for cardiology and hematology placed.

## 2020-04-23 NOTE — Telephone Encounter (Signed)
Patient is calling to speak to [facility]. Patient had granddaughter drop off documents. CB- [phone]

## 2020-04-23 NOTE — Telephone Encounter (Signed)
Will forward to pcp

## 2020-04-24 NOTE — Telephone Encounter (Signed)
Pt was inform that did not received any documents from her so far

## 2020-05-22 MED FILL — ENALAPRIL MALEATE 10 MG TAB: 10 | 60 days supply | Qty: 180 | Fill #1

## 2020-05-22 MED FILL — METOPROLOL TARTRATE 25 MG T: 25 | 60 days supply | Qty: 120 | Fill #2

## 2020-05-23 ENCOUNTER — Other Ambulatory Visit: Payer: Self-pay | Admitting: Ophthalmology

## 2020-05-23 MED FILL — LUMIGAN 0.01% EYE DROPS: 0.01 | 14 days supply | Qty: 3 | Fill #0

## 2020-05-28 ENCOUNTER — Ambulatory Visit: Payer: No Typology Code available for payment source | Admitting: Internal Medicine

## 2020-06-01 ENCOUNTER — Other Ambulatory Visit: Payer: Self-pay

## 2020-06-01 ENCOUNTER — Ambulatory Visit: Payer: No Typology Code available for payment source | Admitting: Internal Medicine

## 2020-06-01 ENCOUNTER — Telehealth: Payer: Self-pay | Admitting: Family Medicine

## 2020-06-01 ENCOUNTER — Ambulatory Visit: Payer: Medicaid Other | Attending: Internal Medicine | Admitting: Internal Medicine

## 2020-06-01 ENCOUNTER — Encounter: Payer: Self-pay | Admitting: Internal Medicine

## 2020-06-01 VITALS — BP 180/100 | HR 76 | Ht 64.0 in | Wt 173.0 lb

## 2020-06-01 DIAGNOSIS — Z2821 Immunization not carried out because of patient refusal: Secondary | ICD-10-CM

## 2020-06-01 DIAGNOSIS — H409 Unspecified glaucoma: Secondary | ICD-10-CM

## 2020-06-01 DIAGNOSIS — I10 Essential (primary) hypertension: Secondary | ICD-10-CM

## 2020-06-01 DIAGNOSIS — D1720 Benign lipomatous neoplasm of skin and subcutaneous tissue of unspecified limb: Secondary | ICD-10-CM

## 2020-06-01 DIAGNOSIS — H3530 Unspecified macular degeneration: Secondary | ICD-10-CM

## 2020-06-01 NOTE — Progress Notes (Signed)
Having cramps in the back of her legs.  Has swelling in arms.

## 2020-06-01 NOTE — Telephone Encounter (Signed)
Patient had a visit earlier today with another provider and I will see if the referral was already placed and if not I will place a referral but did patient say what the issue was with her eyes as to why she needed a referral?

## 2020-06-01 NOTE — Telephone Encounter (Signed)
Pt need a f /u eye referral . Thank you

## 2020-06-01 NOTE — Progress Notes (Signed)
Patient ID: XOE HOE, female    DOB: 1945-07-31  MRN: [id number]  CC: Hypertension   Subjective: Theresa [name] is a 74 y.o. female who presents for f/u visit.  PCP is Dr. Chapman Fitch.   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 Lindi Adie.  HTN:   BP tends to run high and pt very sensitive to meds.  Several meds tried in past including Lisinopril, Norvasc and Hydralazine had to be d/c due to S.E. currently on enalapril and metoprolol which she takes consistently.  Reports BP has been fluctuating. Has her home BP device with her to show me her readings.  Some of her recent readings are: 160/77, 162/78, 172/79, 178/85, 174/88, 182/94, 152/70.  A friend has recommended two supplements for her to take for blood pressure - Garlique and Striction BP (this one contains vitamin B, magnesium and Cinnoman).  She wants to know whether these will help with her blood pressure.  Complains of having a lump on her right upper arm that has been present since 2018.  She thinks it was caused by Pravachol which she was on in the past.  It causes no pain.  Has not changed in size over the past year.  States she was told by her hematologist that its fatty tissue.     Complain of cramps in the legs associated with statin therapy.  Last lipid profile was done in August and total cholesterol was 221 and LDL cholesterol was 136.  She reports that her cardiologist increase the Crestor to 20 mg.  She started having cramps in her legs so she informed them about it.  They subsequently decreased it to 5 mg.  She cuts the 20 mg tablets in quarters.  Cramps have significantly decreased with the lower dose.  She gets cramps occasionally now  Requesting referral to an ophthalmologist here in [place].  States that she was seeing an ophthalmologist at [facility] for cataracts, glaucoma and macular degeneration.  She applied for  assistance through [facility] and they gave her partial assistance.  She has subsequently been approved for Medicaid.  She informed them that she has Medicaid but they never got back to her to say whether they will take Medicaid or not.   Patient Active Problem List   Diagnosis Date Noted  . Lipoma of upper extremity 06/01/2020  . 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 Thrombocytopenia ([place]) 06/13/2019  . Nuclear age-related cataract, both eyes 09/27/2018  . Open angle with borderline findings and low glaucoma risk in both eyes 03/18/2018  . Non-adherence to medical treatment 01/15/2017  . Hyperlipidemia 06/09/2016  . White coat syndrome with diagnosis of hypertension 10/02/2015  . Insomnia 10/02/2015  . Palpitations 10/02/2015     Current Outpatient Medications on File Prior to Visit  Medication Sig Dispense Refill  . acetaminophen (TYLENOL) 500 MG tablet Take 500 mg by mouth every 6 (six) hours as needed for mild pain or moderate pain.    . bimatoprost (LUMIGAN) 0.01 % SOLN Place 1 drop into both eyes nightly.    . chlorhexidine (PERIDEX) 0.12 % solution Use as directed 15 mLs in the mouth or throat 2 (two) times daily. 120 mL 0  . Cholecalciferol (VITAMIN D3 PO) Take by mouth.    . enalapril (VASOTEC) 10 MG tablet Take 2 tablets (20 mg total) by mouth every morning AND 1 tablet (10 mg total) at  bedtime. 180 tablet 2  . famotidine (PEPCID) 20 MG tablet Take 1 tablet (20 mg total) by mouth 2 (two) times daily. To decrease stomach acid 60 tablet 5  . folic acid (FOLVITE) 782 MCG tablet Take by mouth.    . IVERMECTIN PO Take by mouth. Take every 6 months    . metoprolol tartrate (LOPRESSOR) 25 MG tablet Take 1 tablet (25 mg total) by mouth 2 (two) times daily. 60 tablet 5  . Multiple Vitamin (MULTIVITAMIN) tablet Take 1 tablet by mouth daily.    . Multiple Vitamins-Minerals (ZINC PO) Take by mouth.    . ALPRAZolam (XANAX) 0.25 MG tablet Take 1 or 2 tablets by mouth about 30  minutes prior to procedures (Patient not taking: Reported on 06/01/2020) 10 tablet 0  . rosuvastatin (CRESTOR) 20 MG tablet Take 1 tablet (20 mg total) by mouth daily. 90 tablet 3   No current facility-administered medications on file prior to visit.    Allergies  Allergen Reactions  . Amlodipine Swelling  . Coreg [Carvedilol] Other (See Comments)    Drowsiness, dizziness, palpitations  . Shellfish Allergy Swelling  . Trazodone And Nefazodone Swelling    Possible eye pain and swelling  . Hydralazine Hcl Palpitations  . Augmentin [Amoxicillin-Pot Clavulanate]   . Clonidine Derivatives Other (See Comments)    "heart came out of chest"  . Lisinopril Palpitations  . Lisinopril-Hydrochlorothiazide Other (See Comments)    Possible flushing, insomnia    Social History   Socioeconomic History  . Marital status: Widowed    Spouse name: Not on file  . Number of children: 2  . Years of education: Not on file  . Highest education level: Not on file  Occupational History  . Not on file  Tobacco Use  . Smoking status: Never Smoker  . Smokeless tobacco: Never Used  Vaping Use  . Vaping Use: Never used  Substance and Sexual Activity  . Alcohol use: No  . Drug use: No  . Sexual activity: Not Currently    Birth control/protection: Post-menopausal  Other Topics Concern  . Not on file  Social History Narrative  . Not on file   Social Determinants of Health   Financial Resource Strain:   . Difficulty of Paying Living Expenses: Not on file  Food Insecurity:   . Worried About Charity fundraiser in the Last Year: Not on file  . Ran Out of Food in the Last Year: Not on file  Transportation Needs:   . Lack of Transportation (Medical): Not on file  . Lack of Transportation (Non-Medical): Not on file  Physical Activity:   . Days of Exercise per Week: Not on file  . Minutes of Exercise per Session: Not on file  Stress:   . Feeling of Stress : Not on file  Social Connections:   .  Frequency of Communication with Friends and Family: Not on file  . Frequency of Social Gatherings with Friends and Family: Not on file  . Attends Religious Services: Not on file  . Active Member of Clubs or Organizations: Not on file  . Attends Archivist Meetings: Not on file  . Marital Status: Not on file  Intimate Partner Violence:   . Fear of Current or Ex-Partner: Not on file  . Emotionally Abused: Not on file  . Physically Abused: Not on file  . Sexually Abused: Not on file    Family History  Problem Relation Age of Onset  . Heart disease Mother   .  Hypertension Mother   . Cancer Brother     Past Surgical History:  Procedure Laterality Date  . APPENDECTOMY    . BREAST LUMPECTOMY      ROS: Review of Systems Negative except as stated above  PHYSICAL EXAM: BP (!) 180/100   Pulse 76   Ht 5\' 4"  (1.626 m)   Wt 173 lb (78.5 kg)   SpO2 99%   BMI 29.70 kg/m   Physical Exam BP 180/100 General appearance - alert, well appearing, and in no distress Mental status - normal mood, behavior, speech, dress, motor activity, and thought processes Chest - clear to auscultation, no wheezes, rales or rhonchi, symmetric air entry Heart - normal rate, regular rhythm, normal S1, S2, no murmurs, rubs, clicks or gallops Extremities - peripheral pulses normal, no pedal edema, no clubbing or cyanosis Skin -about a 5 to 6 cm x 5 cm soft movable fatty tissue on the right upper inner arm close to the axilla.  It is nontender to touch   CMP Latest Ref Rng & Units 03/09/2020 12/05/2019 11/23/2019  Glucose 65 - 99 mg/dL - - 93  BUN 8 - 27 mg/dL - - 15  Creatinine 0.57 - 1.00 mg/dL - - 0.97  Sodium 134 - 144 mmol/L - - 144  Potassium 3.5 - 5.2 mmol/L - - 4.5  Chloride 96 - 106 mmol/L - - 105  CO2 20 - 29 mmol/L - - 26  Calcium 8.7 - 10.3 mg/dL - - 9.6  Total Protein 6.0 - 8.5 g/dL 7.7 7.5 -  Total Bilirubin 0.0 - 1.2 mg/dL 0.4 0.4 -  Alkaline Phos 48 - 121 IU/L 62 66 -  AST 0 - 40  IU/L 17 16 -  ALT 0 - 32 IU/L 11 9 -   Lipid Panel     Component Value Date/Time   CHOL 221 (H) 03/09/2020 1009   TRIG 59 03/09/2020 1009   HDL 75 03/09/2020 1009   CHOLHDL 2.9 03/09/2020 1009   CHOLHDL 2.7 09/22/2019 0825   VLDL 8 09/22/2019 0825   LDLCALC 136 (H) 03/09/2020 1009    CBC    Component Value Date/Time   WBC 5.3 03/26/2020 1117   WBC 4.3 08/16/2018 1416   RBC 4.64 03/26/2020 1117   HGB 12.7 03/26/2020 1117   HGB 12.8 05/12/2019 1605   HCT 38.8 03/26/2020 1117   HCT 38.4 05/12/2019 1605   PLT 81 (L) 03/26/2020 1117   PLT 70 (LL) 05/12/2019 1605   MCV 83.6 03/26/2020 1117   MCV 84 05/12/2019 1605   MCH 27.4 03/26/2020 1117   MCHC 32.7 03/26/2020 1117   RDW 13.2 03/26/2020 1117   RDW 12.8 05/12/2019 1605   LYMPHSABS 1.5 03/26/2020 1117   LYMPHSABS 1.6 05/12/2019 1605   MONOABS 0.4 03/26/2020 1117   EOSABS 0.2 03/26/2020 1117   EOSABS 0.2 05/12/2019 1605   BASOSABS 0.1 03/26/2020 1117   BASOSABS 0.1 05/12/2019 1605    ASSESSMENT AND PLAN: 1. Hypertension, uncontrolled Advised patient that her blood pressure is not controlled.  I recommended that we try adding another blood pressure medication but patient was very resistant stating that she does not want to try anything else because the medicines make her dizzy.  Advised of the risks of uncontrolled blood pressure including acute cardiovascular events.  Advised that the supplements that she has probably will not have any significant impact on her blood pressure.  She will follow-up with her PCP in 2 weeks.  2. Lipoma of  upper extremity, unspecified laterality Advised to observe.  If it increases in size or becomes bothersome to her that her PCP can refer her to a surgeon to have it removed.  3. Glaucoma, unspecified glaucoma type, unspecified laterality 4. Macular degeneration of both eyes, unspecified type Referral submitted for ophthalmology here locally.  5. Influenza vaccination declined This was  recommended and offered.  Patient declined.     Patient was given the opportunity to ask questions.  Patient verbalized understanding of the plan and was able to repeat key elements of the plan.   Orders Placed This Encounter  Procedures  . Ambulatory referral to Ophthalmology     Requested Prescriptions    No prescriptions requested or ordered in this encounter    Return in about 2 weeks (around 06/15/2020) for Dr. Chapman Fitch.Karle Plumber, MD, FACP

## 2020-06-01 NOTE — Telephone Encounter (Signed)
Patient is calling because is in need of a referral to an eye specialist. Please advise [phone]- [phone]

## 2020-06-04 NOTE — Telephone Encounter (Signed)
It looks like Dr. Wynetta Emery placed a ophthalmology referral for patient when patient saw Dr. Wynetta Emery this past Friday

## 2020-06-04 NOTE — Telephone Encounter (Signed)
F /u on glaucoma! Thank you

## 2020-06-13 DIAGNOSIS — E785 Hyperlipidemia, unspecified: Secondary | ICD-10-CM | POA: Diagnosis not present

## 2020-06-13 LAB — HEPATIC FUNCTION PANEL
ALT: 9 IU/L (ref 0–32)
AST: 15 IU/L (ref 0–40)
Albumin: 4.4 g/dL (ref 3.7–4.7)
Alkaline Phosphatase: 67 IU/L (ref 44–121)
Bilirubin Total: 0.5 mg/dL (ref 0.0–1.2)
Bilirubin, Direct: 0.13 mg/dL (ref 0.00–0.40)
Total Protein: 7.4 g/dL (ref 6.0–8.5)

## 2020-06-13 LAB — LIPID PANEL
Chol/HDL Ratio: 2.9 ratio (ref 0.0–4.4)
Cholesterol, Total: 214 mg/dL — ABNORMAL HIGH (ref 100–199)
HDL: 73 mg/dL (ref >39)
LDL Chol Calc (NIH): 131 mg/dL — ABNORMAL HIGH (ref 0–99)
Triglycerides: 58 mg/dL (ref 0–149)
VLDL Cholesterol Cal: 10 mg/dL (ref 5–40)

## 2020-06-22 ENCOUNTER — Telehealth: Payer: Self-pay | Admitting: Family Medicine

## 2020-06-22 NOTE — Telephone Encounter (Signed)
Review this medication for refill

## 2020-06-22 NOTE — Telephone Encounter (Signed)
Medication Refill - Medication: IVERMECTIN PO    Has the patient contacted their pharmacy? No. (Agent: If no, request that the patient contact the pharmacy for the refill.) (Agent: If yes, when and what did the pharmacy advise?)  Preferred Pharmacy (with phone number or street name):  [place], [place]. Phone:  [phone]  Fax:  [phone]       Agent: Please be advised that RX refills may take up to 3 business days. We ask that you follow-up with your pharmacy.

## 2020-06-25 ENCOUNTER — Telehealth: Payer: Self-pay

## 2020-06-25 NOTE — Telephone Encounter (Signed)
Patient called requesting appointment with Dr. Linus Salmons regarding Onchocerciasis. Reports "feeling movement" in her skin and increased itching. Patient will call her niece to set up transportation and call us back for appointment once she knows her availability.    Beryle Flock, RN

## 2020-06-25 NOTE — Telephone Encounter (Signed)
Pt stated she was to have a Med review appt this Friday but it has been cancelled. Pt stated she mentioned this medication to Dr. Wynetta Emery and was advised to schedule the Med review / Pt is trying to get a refill for IVERMECTIN PO and wants to know if her [facility] provider will takeover and fill it/ please advise asap

## 2020-06-25 NOTE — Telephone Encounter (Signed)
This is listed under the orders of a historical provider. I reviewed the chart and this is monitored by Dr. Linus Salmons (ID) for onchocerciasis. I believe this medication order will have to come from him.

## 2020-06-25 NOTE — Telephone Encounter (Signed)
Will forward to Dr. Johnson  

## 2020-06-26 NOTE — Telephone Encounter (Signed)
Please see luke message regarding medication

## 2020-06-27 ENCOUNTER — Ambulatory Visit: Payer: Medicaid Other | Admitting: Internal Medicine

## 2020-06-27 ENCOUNTER — Encounter: Payer: Self-pay | Admitting: Internal Medicine

## 2020-06-27 ENCOUNTER — Other Ambulatory Visit: Payer: Self-pay | Admitting: Internal Medicine

## 2020-06-27 ENCOUNTER — Other Ambulatory Visit: Payer: Self-pay

## 2020-06-27 DIAGNOSIS — B731 Onchocerciasis without eye disease: Secondary | ICD-10-CM | POA: Diagnosis not present

## 2020-06-27 MED ORDER — DOXYCYCLINE MONOHYDRATE 100 MG PO CAPS: 200.0000 mg | ORAL_CAPSULE | Freq: Every day | ORAL | 0 refills | Status: DC

## 2020-06-27 MED FILL — DOXYCYCLINE MONO 100 MG CAP: 100 | 34 days supply | Qty: 68 | Fill #0

## 2020-06-27 NOTE — Progress Notes (Signed)
   Subjective:    Patient ID: Theresa [name], female    DOB: 1945-08-13, 74 y.o.   MRN: [id number]  HPI She is here for follow up of a history of onchocerciasis. She was diagnosed about 15 years ago by a skin snip and has been taking ivermectin every 6 months since that time.  I saw her 6 months ago and she described feeling the parasites moving under her skin on both arms, around ears, eyelids.  No redness, no inflammatory reaction.  Did not notice a bulge.  Asking for more ivermectin.     Review of Systems  Constitutional: Negative for fever.  Eyes: Negative for pain, discharge, redness and itching.  Gastrointestinal: Negative for diarrhea.  Skin: Negative for rash.       Objective:   Physical Exam Eyes:     General: No scleral icterus.       Right eye: No discharge.        Left eye: No discharge.     Conjunctiva/sclera: Conjunctivae normal.  Skin:    Findings: No erythema, lesion or rash.  Neurological:     General: No focal deficit present.     Mental Status: She is alert.  Psychiatric:        Mood and Affect: Mood normal.   SH: no tobacco       Assessment & Plan:

## 2020-06-27 NOTE — Assessment & Plan Note (Addendum)
At this point, it is unlikely she has persistent parasites this far past the expected lifecycle.  I though did offer her more definitive treatment with doxycycline and will follow that with a final dose of ivermectin in 6 months.  She prefers to do this.  She is asking about a skin snip but there is no obvious clinical location.  20 minutes spent including discussion of the lifecycle, treatment options and typical skin manifestations.    rtc in 6 months  ADDENDUM 12/9: patient called and stated she will not take doxycycline due to side effects she read about.  Requesting ivermectin.  It was again explained that she has no active lesions or concerns for active adult forms and is long past the lifecycle now in a non-endemic area.  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  I am ok with not taking the doxycycline to eradicate any further reproductive ability since it is past the lifecycle.   She can follow up if she develops any new rash - nodules, papular dermatitis, lichenified dermatitis

## 2020-06-27 NOTE — Telephone Encounter (Signed)
Contacted pt to go over Dr. Wynetta Emery message pt states she seen ID today and got everything taken care of

## 2020-06-29 ENCOUNTER — Ambulatory Visit: Payer: No Typology Code available for payment source | Admitting: Family Medicine

## 2020-07-03 MED FILL — LUMIGAN 0.01% EYE DROPS: 0.01 | 14 days supply | Qty: 3 | Fill #1

## 2020-07-04 ENCOUNTER — Telehealth: Payer: Self-pay | Admitting: *Deleted

## 2020-07-04 NOTE — Telephone Encounter (Signed)
Patient called to say that doxycycline will not work for her. She has googled the side effects of the doxycyline, already has headache, stomach pain, high blood pressure. She has not started the doxycycline, will not take it based on the potential side effects she has read about. She said she had no issues with the ivermectin, would like cure. Please advise. Landis Gandy, RN

## 2020-07-05 NOTE — Telephone Encounter (Signed)
As discussed at the appointment, it is way past the lifecycle and there are no active areas of concern so no indication for ivermectin.  Doxycycline was offered to eradicate any immature forms and "cure" this though not likely to be present anyway so ok not to take the doxycycline.  Ivermectin is not a cure.   Can cancel her follow up appt then.

## 2020-07-09 NOTE — Telephone Encounter (Signed)
Left message with Dr Henreitta Leber advice, asked her to call back.  Will follow to make sure she knows no need to follow up at [facility]. Landis Gandy, RN

## 2020-07-10 ENCOUNTER — Telehealth: Payer: Self-pay

## 2020-07-10 NOTE — Telephone Encounter (Signed)
RN spoke with patient to cancel follow-up appointment in June per Dr. Linus Salmons. Per Dr. Henreitta Leber last note, patient will not need follow-up unless new rash develops. RN advised the patient of this, she verbalized understanding and has no further questions at this time.   Beryle Flock, RN

## 2020-07-10 NOTE — Telephone Encounter (Signed)
Patient called saying she can not take the doxycycline due to her numerous health conditions. She states that she can feel the parasites moving around more at night, reports itching in eyes and ears. Requesting Ivermectin and an appointment with Dr. Linus Salmons. RN advised patient per Dr. Henreitta Leber notes, that it is okay if she does not wish to take the doxycycline, but that there is no indication for Ivermectin at this time. RN advised patient that she has a follow-up scheduled with Dr. Linus Salmons in June. Patient states she may be out of the country then. RN advised patient to reschedule once her travel plans are more definitive. Patient verbalized understanding and has no further questions.    Beryle Flock, RN

## 2020-07-23 ENCOUNTER — Telehealth: Payer: Self-pay | Admitting: Hematology and Oncology

## 2020-07-23 NOTE — Telephone Encounter (Signed)
Rescheduled appointment per 12/27 schedule message. Patient is aware of changes. 

## 2020-07-24 ENCOUNTER — Telehealth: Payer: Self-pay

## 2020-07-24 NOTE — Telephone Encounter (Signed)
Copied from CRM [id number]. Topic: General - Other >> Jul 24, 2020 10:44 AM Gwenlyn Fudge wrote: Reason for CRM: Pt called and is requesting to speak with Mikle Bosworth regarding her last appt and the bill attached to it. She states that it was supposed to be paid for. Please advise.

## 2020-07-24 NOTE — Telephone Encounter (Signed)
I return Pt call, I ask the Pt to bring the bill to see if we can help how it will be

## 2020-08-06 ENCOUNTER — Other Ambulatory Visit: Payer: Self-pay | Admitting: Cardiology

## 2020-08-06 ENCOUNTER — Other Ambulatory Visit: Payer: Self-pay | Admitting: Adult Health

## 2020-08-06 DIAGNOSIS — I10 Essential (primary) hypertension: Secondary | ICD-10-CM

## 2020-08-06 MED FILL — METOPROLOL TARTRATE 25 MG T: 25 | 90 days supply | Qty: 180 | Fill #0

## 2020-08-06 MED FILL — LUMIGAN 0.01% EYE DROPS: 0.01 | 14 days supply | Qty: 3 | Fill #2

## 2020-08-06 MED FILL — ENALAPRIL MALEATE 10 MG TAB: 10 | 90 days supply | Qty: 270 | Fill #0

## 2020-09-15 DIAGNOSIS — Z20822 Contact with and (suspected) exposure to covid-19: Secondary | ICD-10-CM | POA: Diagnosis not present

## 2020-09-21 ENCOUNTER — Ambulatory Visit: Payer: No Typology Code available for payment source | Admitting: Hematology and Oncology

## 2020-09-21 ENCOUNTER — Other Ambulatory Visit: Payer: No Typology Code available for payment source

## 2020-09-24 ENCOUNTER — Telehealth: Payer: Self-pay

## 2020-09-24 ENCOUNTER — Inpatient Hospital Stay: Payer: Medicaid Other | Admitting: Hematology and Oncology

## 2020-09-24 ENCOUNTER — Inpatient Hospital Stay: Payer: Medicaid Other

## 2020-09-24 NOTE — Telephone Encounter (Signed)
Pt would like to know how soon can she get her booster shot.

## 2020-09-24 NOTE — Telephone Encounter (Signed)
Copied from [place] [id number]. Topic: Quick Communication - See Telephone Encounter >> Sep 24, 2020  8:45 AM Loma Boston wrote: CRM for notification. See Telephone encounter for: 09/24/20.Pt tested positive for covid on the 19th wants a FU call [phone] No real symptoms  concerning but fatigued. O2 around 97, just still concerned with fatigue. Wants a call back and advice on booster.   Former [facility] patient. Please advise.

## 2020-09-24 NOTE — Telephone Encounter (Signed)
Pt is very upset as had left message earlier today, upset as call not returned, upset as was to have a PCP in place of [facility] and she cannot connect with anyone FU with pt to advise

## 2020-09-25 ENCOUNTER — Other Ambulatory Visit: Payer: Self-pay | Admitting: Physician Assistant

## 2020-09-25 ENCOUNTER — Ambulatory Visit: Payer: Self-pay | Admitting: *Deleted

## 2020-09-25 MED FILL — LUMIGAN 0.01% EYE DROPS: 0.01 | 18 days supply | Qty: 3 | Fill #0

## 2020-09-25 NOTE — Telephone Encounter (Signed)
Patient returned call and requesting information about quarantine and when she can get a [employer] booster. Reviewed with patient message from Dr. Margarita Rana from 8:23 am she is able to get her booster when symptoms resolved. Reviewed with patient fatigue should not prevent her from receiving the vaccine. Patient would like to inform Dr. Wynetta Emery that she tested positive for covid Feb. 19 and has completed her quarantine. Patient asking if she needs to test negative prior to getting the booster. Patient verbalized understanding she does not need to test again and if asymptomatic, she can scheduled and get the [place] booster shot. Patient verbalized she may continue to experience fatigue and to stay hydrated. Patient will call back to schedule a booster after working on her transportation needs.  Care advise given. Patient verbalized understanding of care advise and to call back if needed and to schedule booster.   Reason for Disposition . [1] Follow-up call to recent contact AND [2] information only call, no triage required  Answer Assessment - Initial Assessment Questions 1. REASON FOR CALL or QUESTION: "What is your reason for calling today?" or "How can I best help you?" or "What question do you have that I can help answer?"     Patient returned call and would like to inform Dr. Wynetta Emery she has tested postitive for covid and when she can get a booster?  Protocols used: INFORMATION ONLY CALL - NO TRIAGE-A-AH

## 2020-09-25 NOTE — Telephone Encounter (Signed)
FYI

## 2020-09-25 NOTE — Telephone Encounter (Signed)
She is able to get her booster shot when symptoms resolve.  Fatigue sometimes lingers in patients who have had COVID hence that should not prevent her from receiving the vaccine.

## 2020-09-26 NOTE — Telephone Encounter (Signed)
Pt has been called and informed that she ca obtain vaccine as soon as she has no symptoms.

## 2020-10-16 ENCOUNTER — Other Ambulatory Visit: Payer: Self-pay | Admitting: Physician Assistant

## 2020-10-16 MED FILL — TIMOLOL 0.5% EYE DROPS: 0.5 | 75 days supply | Qty: 10 | Fill #0

## 2020-10-18 ENCOUNTER — Other Ambulatory Visit: Payer: Self-pay | Admitting: *Deleted

## 2020-10-18 DIAGNOSIS — D696 Thrombocytopenia, unspecified: Secondary | ICD-10-CM

## 2020-10-21 NOTE — Assessment & Plan Note (Signed)
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 (Although in her case, low probability) Ultrasound of the abdomen: Normal spleen  Return to clinic in32months with labs and follow-up

## 2020-10-21 NOTE — Progress Notes (Signed)
Patient Care Team: Pcp, No as PCP - General  DIAGNOSIS:    ICD-10-CM   1. Thrombocytopenia ([place])  D69.6     CHIEF COMPLIANT: Follow-up ofthrombocytopenia  INTERVAL HISTORY: Theresa [name] is a 75 y.o. with above-mentioned history of thrombocytopenia. She presentsto the clinictoday for follow-up.  She has had a few issues related to her teeth and thinks she needs to have a dental extraction.  She is waiting for the platelets to improve before she goes to see her dentist.  Denies any bleeding or bruising problems.  ALLERGIES:  is allergic to amlodipine, coreg [carvedilol], shellfish allergy, trazodone and nefazodone, hydralazine hcl, augmentin [amoxicillin-pot clavulanate], clonidine derivatives, lisinopril, and lisinopril-hydrochlorothiazide.  MEDICATIONS:  Current Outpatient Medications  Medication Sig Dispense Refill  . acetaminophen (TYLENOL) 500 MG tablet Take 500 mg by mouth every 6 (six) hours as needed for mild pain or moderate pain.    Marland Kitchen ALPRAZolam (XANAX) 0.25 MG tablet Take 1 or 2 tablets by mouth about 30 minutes prior to procedures 10 tablet 0  . bimatoprost (LUMIGAN) 0.01 % SOLN Place 1 drop into both eyes nightly.    . chlorhexidine (PERIDEX) 0.12 % solution Use as directed 15 mLs in the mouth or throat 2 (two) times daily. 120 mL 0  . Cholecalciferol (VITAMIN D3 PO) Take by mouth.    . doxycycline (MONODOX) 100 MG capsule Take 2 capsules (200 mg total) by mouth daily. 84 capsule 0  . enalapril (VASOTEC) 10 MG tablet TAKE 2 TABLETS (20 MG TOTAL) BY MOUTH EVERY MORNING AND 1 TABLET (10 MG TOTAL) AT BEDTIME. 180 tablet 2  . famotidine (PEPCID) 20 MG tablet Take 1 tablet (20 mg total) by mouth 2 (two) times daily. To decrease stomach acid 60 tablet 5  . folic acid (FOLVITE) 032 MCG tablet Take by mouth.    . metoprolol tartrate (LOPRESSOR) 25 MG tablet TAKE 1 TABLET (25 MG TOTAL) BY MOUTH 2 (TWO) TIMES DAILY. 120 tablet 5  . Multiple Vitamin (MULTIVITAMIN) tablet Take 1  tablet by mouth daily.    . Multiple Vitamins-Minerals (ZINC PO) Take by mouth.    . rosuvastatin (CRESTOR) 20 MG tablet Take 1 tablet (20 mg total) by mouth daily. 90 tablet 3   No current facility-administered medications for this visit.    PHYSICAL EXAMINATION: ECOG PERFORMANCE STATUS: 1 - Symptomatic but completely ambulatory  Vitals:   10/22/20 1027  BP: (!) 142/90  Pulse: 64  Resp: 17  Temp: 97.9 F (36.6 C)  SpO2: 100%   Filed Weights   10/22/20 1027  Weight: 176 lb 8 oz (80.1 kg)     LABORATORY DATA:  I have reviewed the data as listed CMP Latest Ref Rng & Units 06/13/2020 03/09/2020 12/05/2019  Glucose 65 - 99 mg/dL - - -  BUN 8 - 27 mg/dL - - -  Creatinine 0.57 - 1.00 mg/dL - - -  Sodium 134 - 144 mmol/L - - -  Potassium 3.5 - 5.2 mmol/L - - -  Chloride 96 - 106 mmol/L - - -  CO2 20 - 29 mmol/L - - -  Calcium 8.7 - 10.3 mg/dL - - -  Total Protein 6.0 - 8.5 g/dL 7.4 7.7 7.5  Total Bilirubin 0.0 - 1.2 mg/dL 0.5 0.4 0.4  Alkaline Phos 44 - 121 IU/L 67 62 66  AST 0 - 40 IU/L 15 17 16   ALT 0 - 32 IU/L 9 11 9     Lab Results  Component Value Date  WBC 5.6 10/22/2020   HGB 13.1 10/22/2020   HCT 39.6 10/22/2020   MCV 84.1 10/22/2020   PLT 113 (L) 10/22/2020   NEUTROABS 3.4 10/22/2020    ASSESSMENT & PLAN:  Thrombocytopenia (HCC) Isolated thrombocytopenia:  10/02/2015: Platelet count: 129 08/16/2018: Platelet count 105 05/12/2019: Platelet count 70 06/13/2019: Platelets 101 with elevated mean platelet count 09/22/2019:Platelets 103 12/21/2019: Platelet count 88 03/26/20: Platelet count 81 10/22/2020: Platelets 113   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 (Although in her case, low probability) Ultrasound of the abdomen: Normal spleen I provided a letter for her to take it to her dentist.  Return to clinic on an as-needed basis if the platelet count drops below 75.   No orders of the defined types  were placed in this encounter.  The patient has a good understanding of the overall plan. she agrees with it. she will call with any problems that may develop before the next visit here.  Total time spent: 20 mins including face to face time and time spent for planning, charting and coordination of care  Rulon Eisenmenger, MD, MPH 10/22/2020  I, Cloyde Reams Dorshimer, am acting as scribe for Dr. Nicholas Lose.  I have reviewed the above documentation for accuracy and completeness, and I agree with the above.

## 2020-10-22 ENCOUNTER — Inpatient Hospital Stay (HOSPITAL_BASED_OUTPATIENT_CLINIC_OR_DEPARTMENT_OTHER): Payer: Medicaid Other | Admitting: Hematology and Oncology

## 2020-10-22 ENCOUNTER — Other Ambulatory Visit: Payer: Self-pay

## 2020-10-22 ENCOUNTER — Inpatient Hospital Stay: Payer: Medicaid Other | Attending: Hematology and Oncology

## 2020-10-22 DIAGNOSIS — D696 Thrombocytopenia, unspecified: Secondary | ICD-10-CM | POA: Diagnosis not present

## 2020-10-22 LAB — CBC WITH DIFFERENTIAL (CANCER CENTER ONLY)
Abs Immature Granulocytes: 0.01 10*3/uL (ref 0.00–0.07)
Basophils Absolute: 0.1 10*3/uL (ref 0.0–0.1)
Basophils Relative: 1 %
Eosinophils Absolute: 0.2 10*3/uL (ref 0.0–0.5)
Eosinophils Relative: 3 %
HCT: 39.6 % (ref 36.0–46.0)
Hemoglobin: 13.1 g/dL (ref 12.0–15.0)
Immature Granulocytes: 0 %
Lymphocytes Relative: 28 %
Lymphs Abs: 1.6 10*3/uL (ref 0.7–4.0)
MCH: 27.8 pg (ref 26.0–34.0)
MCHC: 33.1 g/dL (ref 30.0–36.0)
MCV: 84.1 fL (ref 80.0–100.0)
Monocytes Absolute: 0.4 10*3/uL (ref 0.1–1.0)
Monocytes Relative: 7 %
Neutro Abs: 3.4 10*3/uL (ref 1.7–7.7)
Neutrophils Relative %: 61 %
Platelet Count: 113 10*3/uL — ABNORMAL LOW (ref 150–400)
RBC: 4.71 MIL/uL (ref 3.87–5.11)
RDW: 13.6 % (ref 11.5–15.5)
WBC Count: 5.6 10*3/uL (ref 4.0–10.5)
nRBC: 0 % (ref 0.0–0.2)

## 2020-10-23 ENCOUNTER — Telehealth: Payer: Self-pay | Admitting: Hematology and Oncology

## 2020-10-23 NOTE — Telephone Encounter (Signed)
Per 3/28 los, no changes made to pt shcedule

## 2020-10-31 ENCOUNTER — Ambulatory Visit: Payer: Medicaid Other | Attending: Internal Medicine

## 2020-10-31 DIAGNOSIS — Z23 Encounter for immunization: Secondary | ICD-10-CM

## 2020-10-31 NOTE — Progress Notes (Signed)
   Covid-19 Vaccination Clinic  Name:  Theresa [name]    MRN: [id number] DOB: [birth year]  10/31/2020  [name] was observed post Covid-19 immunization for 15 minutes without incident. She was provided with Vaccine Information Sheet and instruction to access the V-Safe system.   [name] was instructed to call 911 with any severe reactions post vaccine: Marland Kitchen Difficulty breathing  . Swelling of face and throat  . A fast heartbeat  . A bad rash all over body  . Dizziness and weakness   Immunizations Administered    Name Date Dose VIS Date Route   PFIZER Comrnaty(Gray TOP) Covid-19 Vaccine 10/31/2020  9:11 AM 0.3 mL 07/05/2020 Intramuscular   Manufacturer: [employer]   Lot: [id number]   [place]: [phone]

## 2020-11-01 ENCOUNTER — Encounter: Payer: Self-pay | Admitting: *Deleted

## 2020-11-01 NOTE — Progress Notes (Signed)
Received request from Dr. Buelah Manis from [place] for recent lab work for upcoming oral surgery.  RN successfully faxed labs to [phone].

## 2020-11-02 ENCOUNTER — Other Ambulatory Visit: Payer: Self-pay

## 2020-11-02 MED FILL — Metoprolol Tartrate Tab 25 MG: ORAL | 90 days supply | Qty: 180 | Fill #0 | Status: AC

## 2020-11-02 MED FILL — Bimatoprost Ophth Soln 0.01%: OPHTHALMIC | 50 days supply | Qty: 2.5 | Fill #0 | Status: AC

## 2020-11-02 MED FILL — Enalapril Maleate Tab 10 MG: ORAL | 90 days supply | Qty: 270 | Fill #0 | Status: AC

## 2020-11-05 ENCOUNTER — Other Ambulatory Visit: Payer: Self-pay

## 2020-11-07 ENCOUNTER — Other Ambulatory Visit: Payer: Self-pay

## 2020-11-12 ENCOUNTER — Other Ambulatory Visit (HOSPITAL_COMMUNITY): Payer: Self-pay

## 2020-11-12 MED ORDER — COVID-19 MRNA VAC-TRIS(PFIZER) 30 MCG/0.3ML IM SUSP
INTRAMUSCULAR | 0 refills | Status: DC
  Filled 2020-11-12: qty 0.3, 17d supply, fill #0

## 2020-11-14 ENCOUNTER — Other Ambulatory Visit (HOSPITAL_COMMUNITY): Payer: Self-pay

## 2020-11-26 ENCOUNTER — Other Ambulatory Visit: Payer: Self-pay

## 2020-11-26 ENCOUNTER — Telehealth: Payer: Self-pay | Admitting: *Deleted

## 2020-11-26 ENCOUNTER — Inpatient Hospital Stay (HOSPITAL_BASED_OUTPATIENT_CLINIC_OR_DEPARTMENT_OTHER): Payer: Medicaid Other | Admitting: Hematology and Oncology

## 2020-11-26 ENCOUNTER — Inpatient Hospital Stay: Payer: Medicaid Other | Attending: Hematology and Oncology

## 2020-11-26 DIAGNOSIS — M7989 Other specified soft tissue disorders: Secondary | ICD-10-CM | POA: Diagnosis not present

## 2020-11-26 DIAGNOSIS — D696 Thrombocytopenia, unspecified: Secondary | ICD-10-CM | POA: Diagnosis not present

## 2020-11-26 DIAGNOSIS — Z79899 Other long term (current) drug therapy: Secondary | ICD-10-CM | POA: Diagnosis not present

## 2020-11-26 LAB — CBC WITH DIFFERENTIAL (CANCER CENTER ONLY)
Abs Immature Granulocytes: 0.01 10*3/uL (ref 0.00–0.07)
Basophils Absolute: 0.1 10*3/uL (ref 0.0–0.1)
Basophils Relative: 1 %
Eosinophils Absolute: 0.2 10*3/uL (ref 0.0–0.5)
Eosinophils Relative: 3 %
HCT: 40.2 % (ref 36.0–46.0)
Hemoglobin: 13.4 g/dL (ref 12.0–15.0)
Immature Granulocytes: 0 %
Lymphocytes Relative: 32 %
Lymphs Abs: 1.7 10*3/uL (ref 0.7–4.0)
MCH: 27.7 pg (ref 26.0–34.0)
MCHC: 33.3 g/dL (ref 30.0–36.0)
MCV: 83.1 fL (ref 80.0–100.0)
Monocytes Absolute: 0.3 10*3/uL (ref 0.1–1.0)
Monocytes Relative: 6 %
Neutro Abs: 3.2 10*3/uL (ref 1.7–7.7)
Neutrophils Relative %: 58 %
Platelet Count: 107 10*3/uL — ABNORMAL LOW (ref 150–400)
RBC: 4.84 MIL/uL (ref 3.87–5.11)
RDW: 13.3 % (ref 11.5–15.5)
WBC Count: 5.3 10*3/uL (ref 4.0–10.5)
nRBC: 0 % (ref 0.0–0.2)

## 2020-11-26 LAB — CMP (CANCER CENTER ONLY)
ALT: 8 U/L (ref 0–44)
AST: 17 U/L (ref 15–41)
Albumin: 4.1 g/dL (ref 3.5–5.0)
Alkaline Phosphatase: 76 U/L (ref 38–126)
Anion gap: 9 (ref 5–15)
BUN: 16 mg/dL (ref 8–23)
CO2: 28 mmol/L (ref 22–32)
Calcium: 9.6 mg/dL (ref 8.9–10.3)
Chloride: 104 mmol/L (ref 98–111)
Creatinine: 0.94 mg/dL (ref 0.44–1.00)
GFR, Estimated: >60 mL/min (ref >60)
Glucose, Bld: 129 mg/dL — ABNORMAL HIGH (ref 70–99)
Potassium: 3.8 mmol/L (ref 3.5–5.1)
Sodium: 141 mmol/L (ref 135–145)
Total Bilirubin: 0.4 mg/dL (ref 0.3–1.2)
Total Protein: 8.3 g/dL — ABNORMAL HIGH (ref 6.5–8.1)

## 2020-11-26 NOTE — Assessment & Plan Note (Signed)
Isolated thrombocytopenia:  10/02/2015: Platelet count: 129 08/16/2018: Platelet count 105 05/12/2019: Platelet count 70 06/13/2019: Platelets 101 with elevated mean platelet count 09/22/2019:Platelets 103 12/21/2019: Platelet count 88 03/26/20:Platelet count 81 10/22/2020: Platelets 113 11/26/2020: Platelets 106  Swelling behind left knee: Possibly ruptured Baker's cyst but there is no tenderness or bruising or bleeding. At this point there is no need of any procedures. 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

## 2020-11-26 NOTE — Telephone Encounter (Signed)
Received call from pt with complaint of swelling behind right knee x1 day.  Pt denies recent injury or redness/warmth to the touch.  Per MD pt needing to be scheduled for labs and f/u to evaluate swelling.  Pt notified and states she needs to contact her niece to see when she will be able to bring her for evaluation.

## 2020-11-26 NOTE — Progress Notes (Signed)
Patient Care Team: Pcp, No as PCP - General  DIAGNOSIS:  Encounter Diagnosis  Name Primary?  . Thrombocytopenia ([place])     SUMMARY OF ONCOLOGIC HISTORY: Oncology History   No history exists.    CHIEF COMPLIANT: Urgent follow-up for swelling behind the left knee  INTERVAL HISTORY: Theresa [name] is a 75 year old with above-mentioned history of low-grade thrombocytopenia who called in this morning saying that she has noticed some swelling and behind the knee and it came on suddenly.  It was previously accompanied by sharp pain but the pain went away and she has not had any further pain.  She has no trouble walking.  We brought her in for a blood count check and follow-up.   ALLERGIES:  is allergic to amlodipine, coreg [carvedilol], shellfish allergy, trazodone and nefazodone, hydralazine hcl, augmentin [amoxicillin-pot clavulanate], clonidine derivatives, lisinopril, and lisinopril-hydrochlorothiazide.  MEDICATIONS:  Current Outpatient Medications  Medication Sig Dispense Refill  . acetaminophen (TYLENOL) 500 MG tablet Take 500 mg by mouth every 6 (six) hours as needed for mild pain or moderate pain.    Marland Kitchen ALPRAZolam (XANAX) 0.25 MG tablet Take 1 or 2 tablets by mouth about 30 minutes prior to procedures 10 tablet 0  . bimatoprost (LUMIGAN) 0.01 % SOLN Place 1 drop into both eyes nightly.    . bimatoprost (LUMIGAN) 0.01 % SOLN INSTILL 1 DROP INTO BOTH EYES AT BEDTIME 2.5 mL 11  . bimatoprost (LUMIGAN) 0.01 % SOLN PLACE 1 DROP INTO BOTH EYES NIGHTLY. 2.5 mL 3  . chlorhexidine (PERIDEX) 0.12 % solution Use as directed 15 mLs in the mouth or throat 2 (two) times daily. 120 mL 0  . Cholecalciferol (VITAMIN D3 PO) Take by mouth.    Marland Kitchen COVID-19 mRNA Vac-TriS, Pfizer, SUSP injection Inject into the muscle. 0.3 mL 0  . doxycycline (MONODOX) 100 MG capsule TAKE 2 CAPSULES (200 MG TOTAL) BY MOUTH DAILY. 84 capsule 0  . enalapril (VASOTEC) 10 MG tablet TAKE 2 TABLETS (20 MG TOTAL) BY MOUTH  EVERY MORNING AND 1 TABLET (10 MG TOTAL) AT BEDTIME. 180 tablet 2  . famotidine (PEPCID) 20 MG tablet Take 1 tablet (20 mg total) by mouth 2 (two) times daily. To decrease stomach acid 60 tablet 5  . folic acid (FOLVITE) 474 MCG tablet Take by mouth.    . metoprolol tartrate (LOPRESSOR) 25 MG tablet TAKE 1 TABLET (25 MG TOTAL) BY MOUTH 2 (TWO) TIMES DAILY. 120 tablet 5  . Multiple Vitamin (MULTIVITAMIN) tablet Take 1 tablet by mouth daily.    . Multiple Vitamins-Minerals (ZINC PO) Take by mouth.    . rosuvastatin (CRESTOR) 20 MG tablet Take 1 tablet (20 mg total) by mouth daily. 90 tablet 3  . timolol (TIMOPTIC) 0.5 % ophthalmic solution INSTILL 1 DROP INTO BOTH EYES EVERY MORNING 5 mL 1   No current facility-administered medications for this visit.    PHYSICAL EXAMINATION: ECOG PERFORMANCE STATUS: 1 - Symptomatic but completely ambulatory  Vitals:   11/26/20 1538  BP: (!) 146/87  Pulse: 68  Resp: 18  Temp: 97.8 F (36.6 C)  SpO2: 100%   Filed Weights   11/26/20 1538  Weight: 177 lb 3.2 oz (80.4 kg)    Exam: There is significant swelling behind the left knee but it is not accompanied by any bruising or bleeding symptoms.  It is not tender to touch.  LABORATORY DATA:  I have reviewed the data as listed CMP Latest Ref Rng & Units 11/26/2020 06/13/2020 03/09/2020  Glucose  70 - 99 mg/dL 129(H) - -  BUN 8 - 23 mg/dL 16 - -  Creatinine 0.44 - 1.00 mg/dL 0.94 - -  Sodium 135 - 145 mmol/L 141 - -  Potassium 3.5 - 5.1 mmol/L 3.8 - -  Chloride 98 - 111 mmol/L 104 - -  CO2 22 - 32 mmol/L 28 - -  Calcium 8.9 - 10.3 mg/dL 9.6 - -  Total Protein 6.5 - 8.1 g/dL 8.3(H) 7.4 7.7  Total Bilirubin 0.3 - 1.2 mg/dL 0.4 0.5 0.4  Alkaline Phos 38 - 126 U/L 76 67 62  AST 15 - 41 U/L 17 15 17   ALT 0 - 44 U/L 8 9 11     Lab Results  Component Value Date   WBC 5.3 11/26/2020   HGB 13.4 11/26/2020   HCT 40.2 11/26/2020   MCV 83.1 11/26/2020   PLT 107 (L) 11/26/2020   NEUTROABS 3.2 11/26/2020     ASSESSMENT & PLAN:  Thrombocytopenia (HCC) Isolated thrombocytopenia:  10/02/2015: Platelet count: 129 08/16/2018: Platelet count 105 05/12/2019: Platelet count 70 06/13/2019: Platelets 101 with elevated mean platelet count 09/22/2019:Platelets 103 12/21/2019: Platelet count 88 03/26/20:Platelet count 81 10/22/2020: Platelets 113 11/26/2020: Platelets 106  Swelling behind left knee: Possibly ruptured Baker's cyst but there is no tenderness or bruising or bleeding. At this point there is no need of any procedures. 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  The patient has a good understanding of the overall plan. she agrees with it. she will call with any problems that may develop before the next visit here. Total time spent: 20 mins including face to face time and time spent for planning, charting and co-ordination of care   Harriette Ohara, MD 11/26/20

## 2020-11-27 ENCOUNTER — Other Ambulatory Visit: Payer: Self-pay | Admitting: *Deleted

## 2020-11-27 DIAGNOSIS — D696 Thrombocytopenia, unspecified: Secondary | ICD-10-CM

## 2020-11-30 ENCOUNTER — Other Ambulatory Visit: Payer: Self-pay

## 2020-11-30 MED ORDER — TIMOLOL MALEATE 0.5 % OP SOLN
OPHTHALMIC | 4 refills | Status: DC
  Filled 2020-11-30 (×2): qty 10, 30d supply, fill #0

## 2020-12-28 ENCOUNTER — Ambulatory Visit: Payer: No Typology Code available for payment source | Admitting: Internal Medicine

## 2021-01-01 ENCOUNTER — Other Ambulatory Visit: Payer: Self-pay

## 2021-01-01 MED FILL — Bimatoprost Ophth Soln 0.01%: OPHTHALMIC | 25 days supply | Qty: 2.5 | Fill #1 | Status: CN

## 2021-01-08 ENCOUNTER — Other Ambulatory Visit: Payer: Self-pay

## 2021-01-14 ENCOUNTER — Other Ambulatory Visit: Payer: Self-pay

## 2021-01-14 MED FILL — Bimatoprost Ophth Soln 0.01%: OPHTHALMIC | 25 days supply | Qty: 2.5 | Fill #0 | Status: CN

## 2021-01-15 ENCOUNTER — Other Ambulatory Visit: Payer: Self-pay

## 2021-01-15 MED FILL — Bimatoprost Ophth Soln 0.01%: OPHTHALMIC | 25 days supply | Qty: 2.5 | Fill #1 | Status: AC

## 2021-01-16 ENCOUNTER — Other Ambulatory Visit: Payer: Self-pay

## 2021-02-06 ENCOUNTER — Other Ambulatory Visit: Payer: Self-pay | Admitting: Cardiology

## 2021-02-06 ENCOUNTER — Other Ambulatory Visit: Payer: Self-pay

## 2021-02-06 DIAGNOSIS — I10 Essential (primary) hypertension: Secondary | ICD-10-CM

## 2021-02-06 MED ORDER — ENALAPRIL MALEATE 10 MG PO TABS
ORAL_TABLET | ORAL | 2 refills | Status: DC
  Filled 2021-02-06: qty 180, 60d supply, fill #0
  Filled 2021-05-10 (×2): qty 270, 90d supply, fill #1

## 2021-02-06 MED FILL — Metoprolol Tartrate Tab 25 MG: ORAL | 90 days supply | Qty: 180 | Fill #1 | Status: AC

## 2021-02-07 ENCOUNTER — Other Ambulatory Visit: Payer: Self-pay

## 2021-02-24 IMAGING — US US ABDOMEN COMPLETE
1 series · 14 of 25 positions shown · non-contrast
Comparison: None

CLINICAL DATA: Thrombocytopenia, question splenomegaly

EXAM:
ABDOMEN ULTRASOUND COMPLETE

[Series 1: us abdomen complete · 14 of 109 slices shown]
[im 1/109]
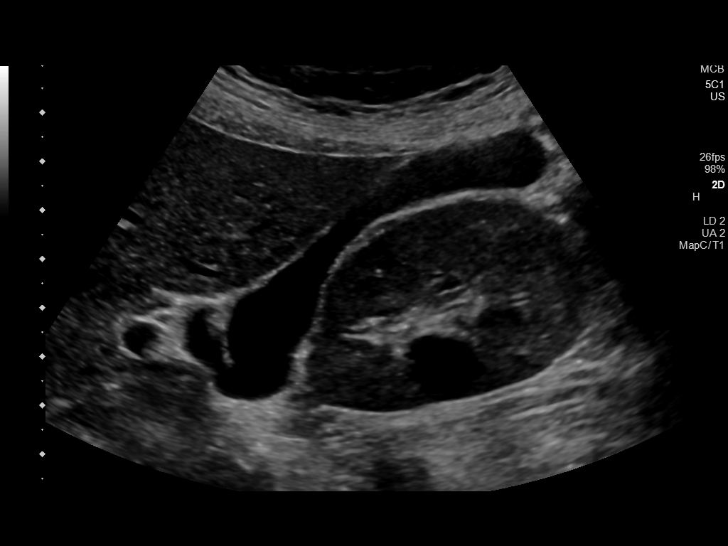
[im 10/109]
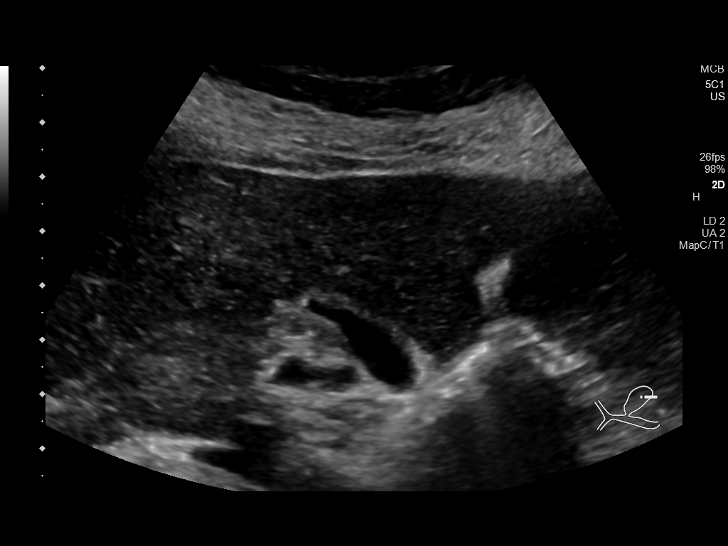
[im 19/109]
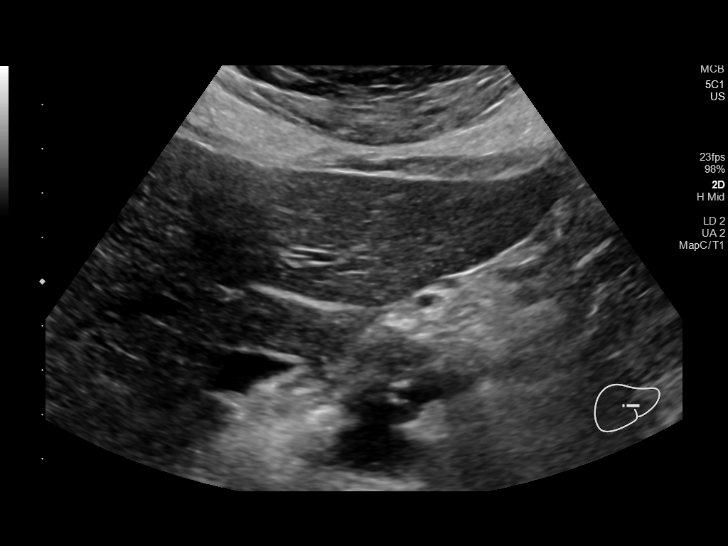
[im 28/109]
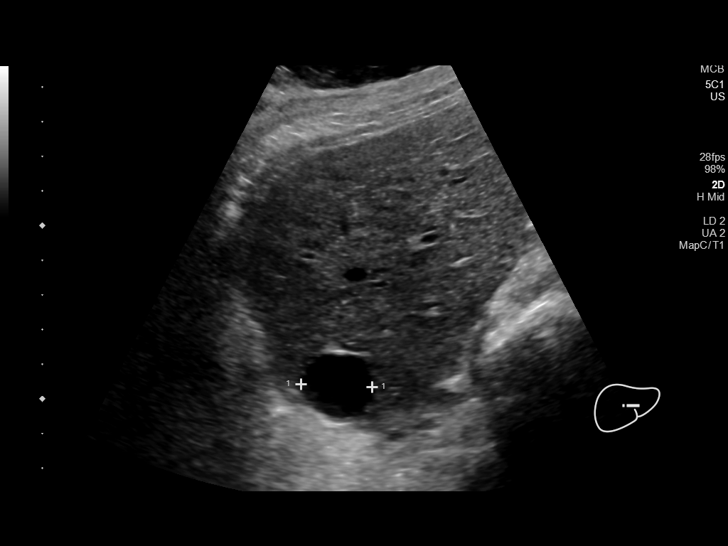
[im 37/109]
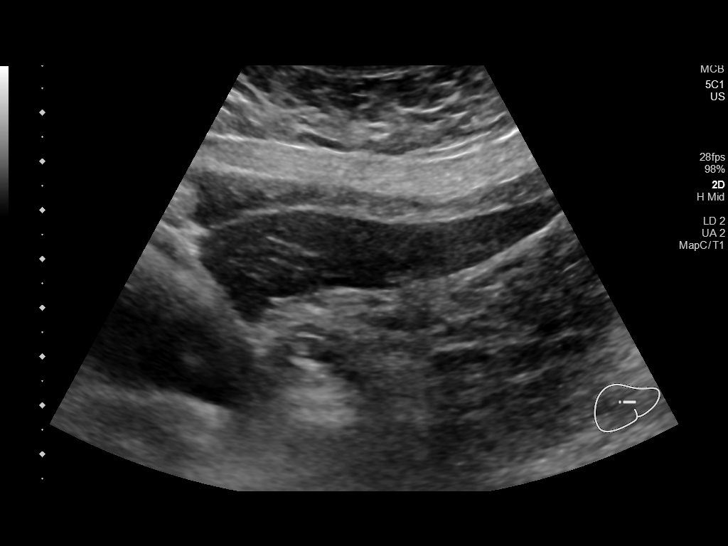
[im 41/109]
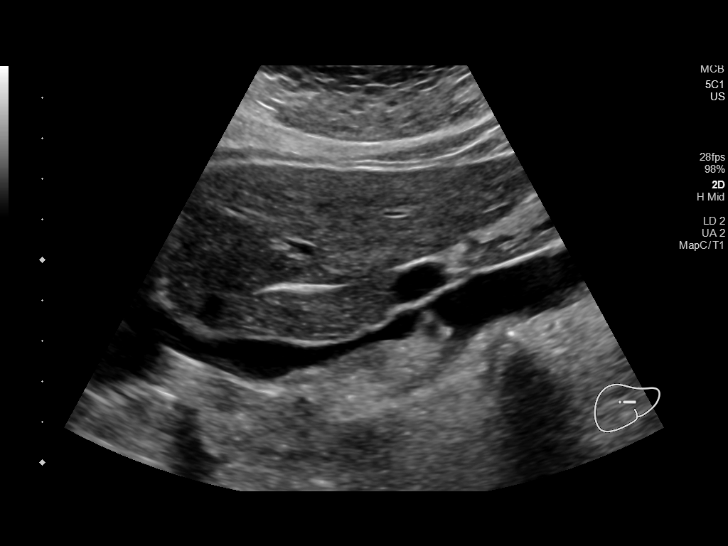
[im 50/109]
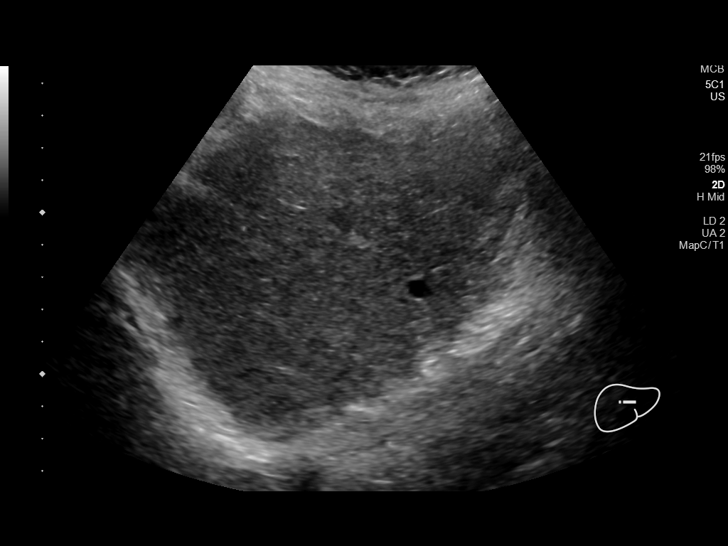
[im 59/109]
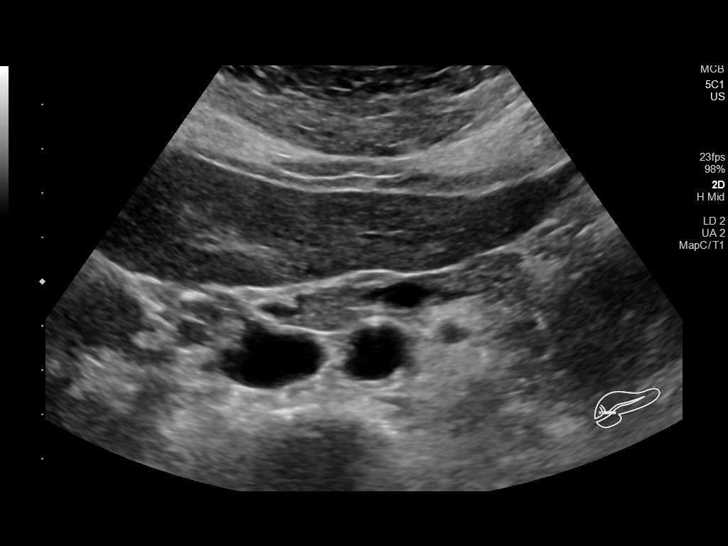
[im 68/109]
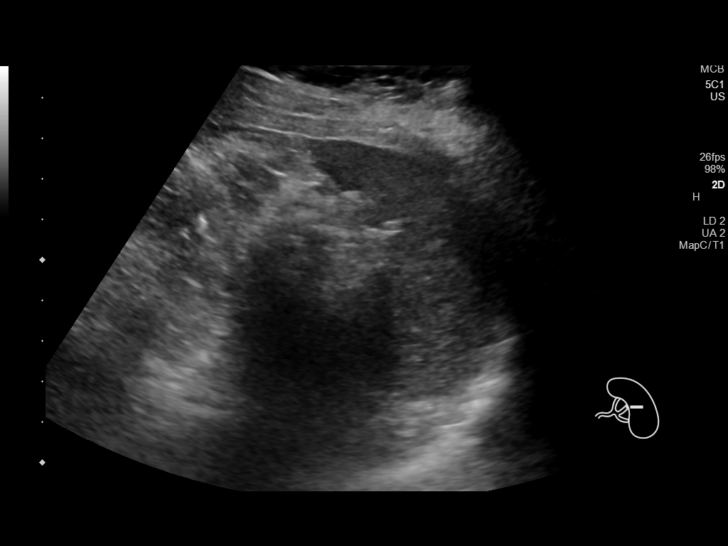
[im 73/109]
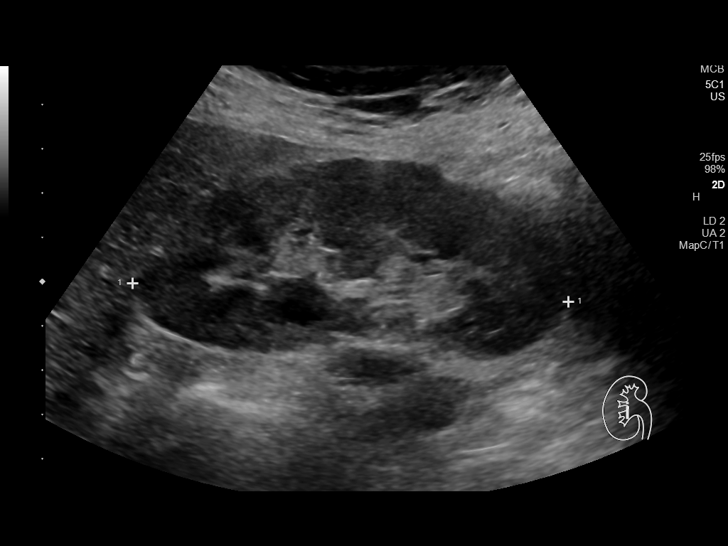
[im 82/109]
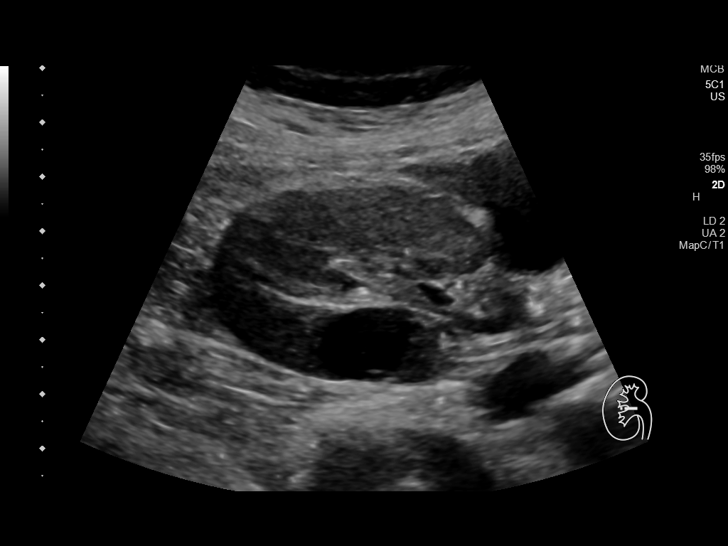
[im 91/109]
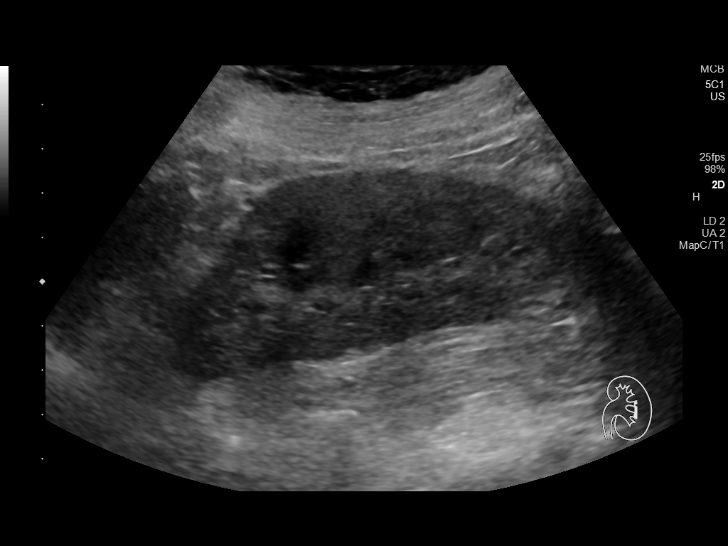
[im 100/109]
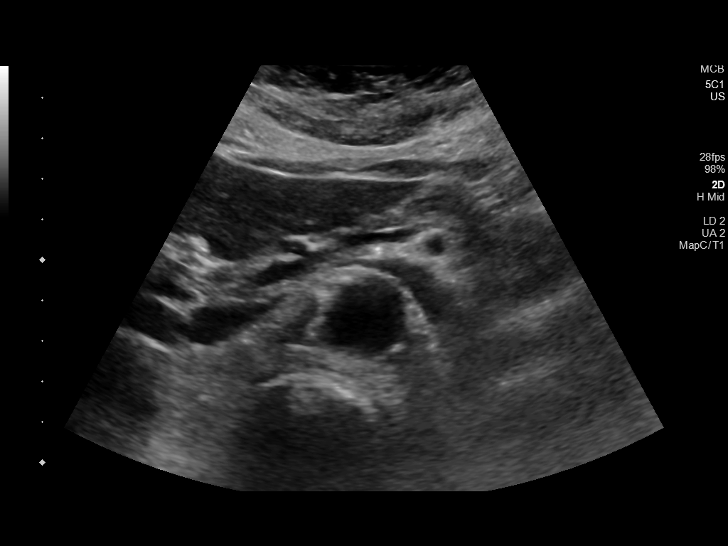
[im 109/109]
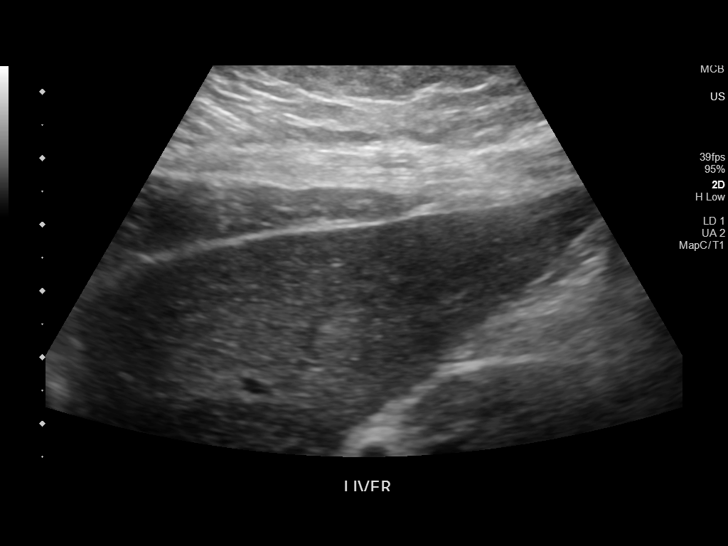

[14 of 25 positions shown; findings below may reference images not displayed]

FINDINGS: Gallbladder: Normally distended without stones or wall thickening.
No pericholecystic fluid or sonographic Murphy sign.

Common bile duct: Diameter: 4 mm, normal

Liver: Normal echogenicity. Small cyst RIGHT lobe 2.1 x 2.1 x
cm. Additional tiny cyst superiorly RIGHT lobe 8 x 6 x 6 mm. Portal
vein is patent on color Doppler imaging with normal direction of
blood flow towards the liver.

IVC: Normal appearance

Pancreas: Normal appearance

Spleen: Normal appearance, 6.0 cm length

Right Kidney: Length: 9.9 cm. Normal cortical thickness and
echogenicity. Small medial cyst 1.6 x 1.6 x 1.3 cm, simple features.
No additional mass or hydronephrosis.

Left Kidney: Length: 11.0 cm. Normal morphology without mass or
hydronephrosis.

Abdominal aorta: Normal caliber. Atherosclerotic changes at
bifurcation.

Other findings: No free fluid.
IMPRESSION: Small hepatic and RIGHT renal cysts.

Atherosclerotic changes at aortic bifurcation.

Otherwise negative exam.

## 2021-03-09 MED FILL — Bimatoprost Ophth Soln 0.01%: OPHTHALMIC | 25 days supply | Qty: 2.5 | Fill #2 | Status: AC

## 2021-03-11 ENCOUNTER — Other Ambulatory Visit: Payer: Self-pay

## 2021-03-13 ENCOUNTER — Other Ambulatory Visit: Payer: Self-pay

## 2021-04-15 ENCOUNTER — Other Ambulatory Visit: Payer: Self-pay

## 2021-04-15 MED FILL — Bimatoprost Ophth Soln 0.01%: OPHTHALMIC | 25 days supply | Qty: 2.5 | Fill #3 | Status: AC

## 2021-04-16 ENCOUNTER — Other Ambulatory Visit: Payer: Self-pay

## 2021-05-10 ENCOUNTER — Other Ambulatory Visit: Payer: Self-pay

## 2021-05-10 MED FILL — Metoprolol Tartrate Tab 25 MG: ORAL | 90 days supply | Qty: 180 | Fill #2 | Status: AC

## 2021-05-13 ENCOUNTER — Other Ambulatory Visit: Payer: Self-pay

## 2021-05-13 MED FILL — Bimatoprost Ophth Soln 0.01%: OPHTHALMIC | 25 days supply | Qty: 2.5 | Fill #4 | Status: AC

## 2021-05-15 ENCOUNTER — Other Ambulatory Visit: Payer: Self-pay

## 2021-05-29 ENCOUNTER — Other Ambulatory Visit: Payer: Self-pay

## 2021-05-29 MED ORDER — CHLORHEXIDINE GLUCONATE 0.12 % MT SOLN
OROMUCOSAL | 3 refills | Status: DC
  Filled 2021-05-29: qty 946, 31d supply, fill #0

## 2021-06-06 ENCOUNTER — Encounter: Payer: Self-pay | Admitting: Physician Assistant

## 2021-06-06 ENCOUNTER — Ambulatory Visit: Payer: Medicare Other | Attending: Physician Assistant | Admitting: Physician Assistant

## 2021-06-06 ENCOUNTER — Other Ambulatory Visit: Payer: Self-pay

## 2021-06-06 VITALS — BP 170/90 | HR 91 | Ht 64.0 in | Wt 177.1 lb

## 2021-06-06 DIAGNOSIS — D696 Thrombocytopenia, unspecified: Secondary | ICD-10-CM

## 2021-06-06 DIAGNOSIS — E785 Hyperlipidemia, unspecified: Secondary | ICD-10-CM

## 2021-06-06 DIAGNOSIS — Z1239 Encounter for other screening for malignant neoplasm of breast: Secondary | ICD-10-CM

## 2021-06-06 DIAGNOSIS — I10 Essential (primary) hypertension: Secondary | ICD-10-CM

## 2021-06-06 DIAGNOSIS — R739 Hyperglycemia, unspecified: Secondary | ICD-10-CM

## 2021-06-06 MED ORDER — ROSUVASTATIN CALCIUM 5 MG PO TABS
5.0000 mg | ORAL_TABLET | Freq: Every day | ORAL | 3 refills | Status: DC
Start: 2021-06-06 — End: 2021-12-05
  Filled 2021-06-06: qty 90, 90d supply, fill #0

## 2021-06-06 MED ORDER — METOPROLOL TARTRATE 25 MG PO TABS
ORAL_TABLET | Freq: Two times a day (BID) | ORAL | 1 refills | Status: DC
  Filled 2021-06-06: qty 180, fill #0
  Filled 2021-07-31: qty 180, 90d supply, fill #0

## 2021-06-06 MED ORDER — ENALAPRIL MALEATE 10 MG PO TABS
ORAL_TABLET | ORAL | 2 refills | Status: DC
  Filled 2021-06-06: qty 180, fill #0
  Filled 2021-07-31 – 2021-10-15 (×2): qty 180, 60d supply, fill #0

## 2021-06-06 NOTE — Patient Instructions (Signed)

## 2021-06-06 NOTE — Progress Notes (Signed)
Patient ID: Theresa [name], female   DOB: 1946/04/17, 75 y.o.   MRN: [id number]   Theresa [name], is a 75 y.o. female  HQI:[id number]  XLK:[id number]  DOB - 28-Jul-1946  Chief Complaint  Patient presents with   Hypertension       Subjective:   Theresa [name] is a 75 y.o. female here today for med RF.  She always has a component of white coat htn.  Says BP at home is 130s/70-80.  No CP/dizziness/SOB.  Occasional slight HA if she forgets to take her medicine.  She is resistant to adding any other meds  She has cut back to crestor at 5mg  bc any higher dose causes her leg cramps.   No problems updated.  ALLERGIES: Allergies  Allergen Reactions   Amlodipine Swelling   Coreg [Carvedilol] Other (See Comments)    Drowsiness, dizziness, palpitations   Shellfish Allergy Swelling   Trazodone And Nefazodone Swelling    Possible eye pain and swelling   Hydralazine Hcl Palpitations   Augmentin [Amoxicillin-Pot Clavulanate]    Clonidine Derivatives Other (See Comments)    "heart came out of chest"   Lisinopril Palpitations   Lisinopril-Hydrochlorothiazide Other (See Comments)    Possible flushing, insomnia    PAST MEDICAL HISTORY: Past Medical History:  Diagnosis Date   Gastric ulcer    Hyperlipidemia    Hypertension    River blindness    Sinusitis     MEDICATIONS AT HOME: Prior to Admission medications   Medication Sig Start Date End Date Taking? Authorizing Provider  acetaminophen (TYLENOL) 500 MG tablet Take 500 mg by mouth every 6 (six) hours as needed for mild pain or moderate pain.   Yes [provider]  bimatoprost (LUMIGAN) 0.01 % SOLN Place 1 drop into both eyes nightly. 08/16/19  Yes [provider]  rosuvastatin (CRESTOR) 5 MG tablet Take 1 tablet (5 mg total) by mouth daily. 06/06/21  Yes Theresa [name], Theresa [name]  ALPRAZolam Duanne Moron) 0.25 MG tablet Take 1 or 2 tablets by mouth about 30 minutes prior to procedures Patient not taking: Reported on  06/06/2021 07/08/19   Fulp, Cammie, MD  bimatoprost (LUMIGAN) 0.01 % SOLN INSTILL 1 DROP INTO BOTH EYES AT BEDTIME Patient not taking: Reported on 06/06/2021 09/25/20 09/25/21  Shirleen Schirmer, PA  chlorhexidine (PERIDEX) 0.12 % solution Use as directed 15 mLs in the mouth or throat 2 (two) times daily. Patient not taking: Reported on 06/06/2021 01/02/20   Elsie Stain, MD  chlorhexidine (PERIDEX) 0.12 % solution Take 15 ml By Mouth twice a day Patient not taking: Reported on 06/06/2021 05/29/21     Cholecalciferol (VITAMIN D3 PO) Take by mouth. Patient not taking: Reported on 06/06/2021    [provider]  COVID-19 mRNA Vac-TriS, Pfizer, SUSP injection Inject into the muscle. Patient not taking: Reported on 06/06/2021 10/31/20   Carlyle Basques, MD  enalapril (VASOTEC) 10 MG tablet TAKE 2 TABLETS (20 MG TOTAL) BY MOUTH EVERY MORNING AND 1 TABLET (10 MG TOTAL) AT BEDTIME. 06/06/21 06/06/22  Theresa [name], Theresa [name]  famotidine (PEPCID) 20 MG tablet Take 1 tablet (20 mg total) by mouth 2 (two) times daily. To decrease stomach acid Patient not taking: Reported on 06/06/2021 11/23/19   Fulp, Ander Gaster, MD  folic acid (FOLVITE) 366 MCG tablet Take by mouth. Patient not taking: Reported on 06/06/2021    [provider]  metoprolol tartrate (LOPRESSOR) 25 MG tablet TAKE 1 TABLET (25 MG TOTAL) BY MOUTH 2 (TWO) TIMES DAILY. 06/06/21  06/06/22  Theresa [name], Theresa [name]  Multiple Vitamin (MULTIVITAMIN) tablet Take 1 tablet by mouth daily. Patient not taking: Reported on 06/06/2021    [provider]  Multiple Vitamins-Minerals (ZINC PO) Take by mouth. Patient not taking: Reported on 06/06/2021    [provider]  timolol (TIMOPTIC) 0.5 % ophthalmic solution INSTILL 1 DROP INTO BOTH EYES EVERY MORNING 10/16/20 10/16/21  Shirleen Schirmer, PA  timolol (TIMOPTIC) 0.5 % ophthalmic solution Instill 1 drop into both eyes every morning Patient not taking: Reported on 06/06/2021 11/30/20        ROS: Neg HEENT Neg resp Neg cardiac Neg GI Neg GU Neg MS Neg psych Neg neuro  Objective:   Vitals:   06/06/21 0908  BP: (!) 170/90  Pulse: 91  SpO2: 99%  Weight: 177 lb 2 oz (80.3 kg)  Height: 5\' 4"  (1.626 [name])   Exam General appearance : Awake, alert, not in any distress. Speech Clear. Not toxic looking HEENT: Atraumatic and Normocephalic Neck: Supple, no JVD. No cervical lymphadenopathy.  Chest: Good air entry bilaterally, CTAB.  No rales/rhonchi/wheezing CVS: S1 S2 regular, no murmurs.  Extremities: B/L Lower Ext shows no edema, both legs are warm to touch Neurology: Awake alert, and oriented X 3, CN II-XII intact, Non focal Skin: No Rash  Data Review Lab Results  Component Value Date   HGBA1C 5.6 08/30/2019   HGBA1C 5.2 10/11/2018   HGBA1C 5.4 10/02/2015    Assessment & Plan   1. Hypertension, uncontrolled Numbers at home ok - Comprehensive metabolic panel - CBC with Differential/Platelet - metoprolol tartrate (LOPRESSOR) 25 MG tablet; TAKE 1 TABLET (25 MG TOTAL) BY MOUTH 2 (TWO) TIMES DAILY.  Dispense: 180 tablet; Refill: 1 - enalapril (VASOTEC) 10 MG tablet; TAKE 2 TABLETS (20 MG TOTAL) BY MOUTH EVERY MORNING AND 1 TABLET (10 MG TOTAL) AT BEDTIME.  Dispense: 180 tablet; Refill: 2  2. Hyperlipidemia, unspecified hyperlipidemia type - Comprehensive metabolic panel - CBC with Differential/Platelet - Lipid panel - rosuvastatin (CRESTOR) 5 MG tablet; Take 1 tablet (5 mg total) by mouth daily.  Dispense: 90 tablet; Refill: 3  3. Thrombocytopenia (HCC) - Comprehensive metabolic panel  4. Hyperglycemia I have had a lengthy discussion and provided education about insulin resistance and the intake of too much sugar/refined carbohydrates.  I have advised the patient to work at a goal of eliminating sugary drinks, candy, desserts, sweets, refined sugars, processed foods, and white carbohydrates.  The patient expresses understanding.  - Hemoglobin A1c  MMG  ordered  Patient have been counseled extensively about nutrition and exercise. Other issues discussed during this visit include: low cholesterol diet, weight control and daily exercise, foot care, annual eye examinations at Ophthalmology, importance of adherence with medications and regular follow-up. We also discussed long term complications of uncontrolled diabetes and hypertension.   Return for 4-6 months-please assign patient a PCP(previously Dr Fulp's patient).  The patient was given clear instructions to go to ER or return to medical center if symptoms don't improve, worsen or new problems develop. The patient verbalized understanding. The patient was told to call to get lab results if they haven't heard anything in the next week.      Theresa Caldron, Theresa [name] [facility] and [facility], [place]   06/06/2021, 9:23 AM

## 2021-06-07 LAB — COMPREHENSIVE METABOLIC PANEL
ALT: 10 IU/L (ref 0–32)
AST: 18 IU/L (ref 0–40)
Albumin/Globulin Ratio: 1.5 (ref 1.2–2.2)
Albumin: 4.6 g/dL (ref 3.7–4.7)
Alkaline Phosphatase: 69 IU/L (ref 44–121)
BUN/Creatinine Ratio: 14 (ref 12–28)
BUN: 15 mg/dL (ref 8–27)
Bilirubin Total: 0.5 mg/dL (ref 0.0–1.2)
CO2: 25 mmol/L (ref 20–29)
Calcium: 9.4 mg/dL (ref 8.7–10.3)
Chloride: 103 mmol/L (ref 96–106)
Creatinine, Ser: 1.04 mg/dL — ABNORMAL HIGH (ref 0.57–1.00)
Globulin, Total: 3 g/dL (ref 1.5–4.5)
Glucose: 99 mg/dL (ref 70–99)
Potassium: 4.2 mmol/L (ref 3.5–5.2)
Sodium: 136 mmol/L (ref 134–144)
Total Protein: 7.6 g/dL (ref 6.0–8.5)
eGFR: 56 mL/min/{1.73_m2} — ABNORMAL LOW (ref >59)

## 2021-06-07 LAB — LIPID PANEL
Chol/HDL Ratio: 3.2 ratio (ref 0.0–4.4)
Cholesterol, Total: 263 mg/dL — ABNORMAL HIGH (ref 100–199)
HDL: 82 mg/dL (ref >39)
LDL Chol Calc (NIH): 172 mg/dL — ABNORMAL HIGH (ref 0–99)
Triglycerides: 57 mg/dL (ref 0–149)
VLDL Cholesterol Cal: 9 mg/dL (ref 5–40)

## 2021-06-07 LAB — CBC WITH DIFFERENTIAL/PLATELET
Basophils Absolute: 0.1 10*3/uL (ref 0.0–0.2)
Basos: 1 %
EOS (ABSOLUTE): 0.1 10*3/uL (ref 0.0–0.4)
Eos: 2 %
Hematocrit: 41.8 % (ref 34.0–46.6)
Hemoglobin: 13.7 g/dL (ref 11.1–15.9)
Immature Grans (Abs): 0 10*3/uL (ref 0.0–0.1)
Immature Granulocytes: 0 %
Lymphocytes Absolute: 0.9 10*3/uL (ref 0.7–3.1)
Lymphs: 16 %
MCH: 27.5 pg (ref 26.6–33.0)
MCHC: 32.8 g/dL (ref 31.5–35.7)
MCV: 84 fL (ref 79–97)
Monocytes Absolute: 0.3 10*3/uL (ref 0.1–0.9)
Monocytes: 5 %
Neutrophils Absolute: 4.4 10*3/uL (ref 1.4–7.0)
Neutrophils: 76 %
Platelets: 82 10*3/uL — CL (ref 150–450)
RBC: 4.98 x10E6/uL (ref 3.77–5.28)
RDW: 13.1 % (ref 11.7–15.4)
WBC: 5.9 10*3/uL (ref 3.4–10.8)

## 2021-06-07 LAB — HEMOGLOBIN A1C
Est. average glucose Bld gHb Est-mCnc: 123 mg/dL
Hgb A1c MFr Bld: 5.9 % — ABNORMAL HIGH (ref 4.8–5.6)

## 2021-06-12 ENCOUNTER — Telehealth: Payer: Self-pay

## 2021-06-12 NOTE — Telephone Encounter (Signed)
Pt. Requests a copy of her lab work from 06/06/21 be mailed to her. Please advise.

## 2021-06-15 ENCOUNTER — Ambulatory Visit
Admission: RE | Admit: 2021-06-15 | Discharge: 2021-06-15 | Disposition: A | Discharge status: Home / Self Care | Payer: Medicare Other | Source: Ambulatory Visit | Attending: Physician Assistant | Admitting: Physician Assistant

## 2021-06-15 ENCOUNTER — Other Ambulatory Visit: Payer: Self-pay

## 2021-06-15 DIAGNOSIS — Z1239 Encounter for other screening for malignant neoplasm of breast: Secondary | ICD-10-CM

## 2021-06-25 ENCOUNTER — Other Ambulatory Visit: Payer: Self-pay

## 2021-06-25 MED ORDER — BRIMONIDINE TARTRATE 0.2 % OP SOLN
1.0000 [drp] | Freq: Two times a day (BID) | OPHTHALMIC | 3 refills | Status: DC
  Filled 2021-06-25: qty 10, 40d supply, fill #0

## 2021-06-25 MED FILL — Bimatoprost Ophth Soln 0.01%: OPHTHALMIC | 25 days supply | Qty: 2.5 | Fill #5 | Status: AC

## 2021-06-27 ENCOUNTER — Other Ambulatory Visit: Payer: Self-pay

## 2021-06-28 ENCOUNTER — Telehealth: Payer: Self-pay | Admitting: Physician Assistant

## 2021-06-28 ENCOUNTER — Ambulatory Visit: Payer: Self-pay | Admitting: Internal Medicine

## 2021-06-28 NOTE — Telephone Encounter (Signed)
Patient requesting copy of lab results be mailed to her

## 2021-06-28 NOTE — Telephone Encounter (Signed)
Reason for Disposition  [1] Caller requesting NON-URGENT health information AND [2] PCP's office is the best resource  Answer Assessment - Initial Assessment Questions 1. REASON FOR CALL or QUESTION: "What is your reason for calling today?" or "How can I best help you?" or "What question do you have that I can help answer?"     Should crestor be decreased back to 5 mg due to leg cramps ?  Protocols used: Information Only Call - No Triage-A-AH

## 2021-06-28 NOTE — Telephone Encounter (Signed)
Patient clled about med, rosuvastatin (CRESTOR) 5 MG tablet. Says decreasing dose is not helping with leg cramps. Please call back   Called patient to review leg cramps. Patient c/o she was told to double dose of crestor  due to cholesterol levels being elevated. C/o taking double dose of crestor x 3 days caused left leg cramping and then right leg cramping. Patient would like to know if she can decrease dose back to 5 mg because she can not tolerate increase dose due to cramping legs. Patient is concerned with dose because she is getting ready to go to [place] on 07/04/21 and not return until Feb or March. Needs enough medication prior to leaving . Patient would like lab results sent to her that she has not received from 06/06/21. Verified address and noted if is different than what is listed in chart. Please send copy of labs to :  [place] [facility] advise given. Patient verbalized understanding of care advise and to call back if symptoms worsen.

## 2021-06-28 NOTE — Telephone Encounter (Signed)
Unable to mail results.  Patient will have to come to the office to get copy of lab results.

## 2021-07-01 NOTE — Telephone Encounter (Signed)
She can decrease her dose if she is having leg cramps.  Okay to mail results to patient as per request.

## 2021-07-09 NOTE — Telephone Encounter (Signed)
Unable to reach. No voicemail set up.  

## 2021-07-11 NOTE — Telephone Encounter (Signed)
Unable to reach. No voicemail set up.  

## 2021-07-24 ENCOUNTER — Ambulatory Visit: Payer: Medicare Other

## 2021-07-31 ENCOUNTER — Other Ambulatory Visit (HOSPITAL_COMMUNITY): Payer: Self-pay

## 2021-07-31 ENCOUNTER — Other Ambulatory Visit: Payer: Self-pay | Admitting: Physician Assistant

## 2021-07-31 DIAGNOSIS — I10 Essential (primary) hypertension: Secondary | ICD-10-CM

## 2021-07-31 NOTE — Telephone Encounter (Signed)
Medication Refill - Medication:metoprolol tartrate (LOPRESSOR) 25 MG tablet  Has the patient contacted their pharmacy? yes (Agent: If no, request that the patient contact the pharmacy for the refill. If patient does not wish to contact the pharmacy document the reason why and proceed with request.) (Agent: If yes, when and what did the pharmacy advise?)pharmacy  prev loc is closed  Preferred Pharm[facility] (with phone number or street name) [facility] [phone] Has the patient been seen for an appointment in the last year OR does the patient have an upcoming appointment? yes  Agent: Please be advised that RX refills may take up to 3 business days. We ask that you follow-up with your pharmacy.

## 2021-08-01 MED ORDER — METOPROLOL TARTRATE 25 MG PO TABS
ORAL_TABLET | Freq: Two times a day (BID) | ORAL | 1 refills | Status: DC
  Filled 2021-08-01: qty 180, fill #0
  Filled 2021-10-16 – 2021-10-21 (×2): qty 180, 90d supply, fill #0

## 2021-08-01 NOTE — Telephone Encounter (Signed)
Requested Prescriptions  Pending Prescriptions Disp Refills   metoprolol tartrate (LOPRESSOR) 25 MG tablet 180 tablet 1    Sig: TAKE 1 TABLET (25 MG TOTAL) BY MOUTH 2 (TWO) TIMES DAILY.     Cardiovascular:  Beta Blockers Failed - 07/31/2021  4:24 PM      Failed - Last BP in normal range    BP Readings from Last 1 Encounters:  06/06/21 (!) 170/90         Passed - Last Heart Rate in normal range    Pulse Readings from Last 1 Encounters:  06/06/21 91         Passed - Valid encounter within last 6 months    Recent Outpatient Visits          1 month ago Hypertension, uncontrolled   [place], [place]   1 year ago Hypertension, uncontrolled   [place], MD   1 year ago Hypertension, uncontrolled   [place], MD   1 year ago Hypertension, uncontrolled   [place], [place]   1 year ago Hypertension, uncontrolled   [place], [place]      Future Appointments            In 4 months [place], Dionne Bucy, PA-C [place]

## 2021-08-02 ENCOUNTER — Other Ambulatory Visit: Payer: Self-pay

## 2021-09-27 ENCOUNTER — Other Ambulatory Visit: Payer: Self-pay

## 2021-10-07 ENCOUNTER — Other Ambulatory Visit: Payer: Self-pay

## 2021-10-08 ENCOUNTER — Other Ambulatory Visit: Payer: Self-pay

## 2021-10-08 MED ORDER — BRIMONIDINE TARTRATE-TIMOLOL 0.2-0.5 % OP SOLN
1.0000 [drp] | Freq: Two times a day (BID) | OPHTHALMIC | 3 refills | Status: DC
  Filled 2021-10-08: qty 5, 18d supply, fill #0

## 2021-10-09 ENCOUNTER — Other Ambulatory Visit: Payer: Self-pay

## 2021-10-09 MED ORDER — BRIMONIDINE TARTRATE-TIMOLOL 0.2-0.5 % OP SOLN
1.0000 [drp] | Freq: Two times a day (BID) | OPHTHALMIC | 3 refills | Status: DC
  Filled 2021-10-09: qty 5, 20d supply, fill #0

## 2021-10-09 MED ORDER — LUMIGAN 0.01 % OP SOLN
OPHTHALMIC | 3 refills | Status: DC
  Filled 2021-10-09: qty 2.5, 20d supply, fill #0

## 2021-10-11 ENCOUNTER — Other Ambulatory Visit: Payer: Self-pay

## 2021-10-15 ENCOUNTER — Other Ambulatory Visit (HOSPITAL_COMMUNITY): Payer: Self-pay

## 2021-10-15 ENCOUNTER — Other Ambulatory Visit: Payer: Self-pay

## 2021-10-16 ENCOUNTER — Other Ambulatory Visit: Payer: Self-pay

## 2021-10-21 ENCOUNTER — Other Ambulatory Visit (HOSPITAL_COMMUNITY): Payer: Self-pay

## 2021-10-21 ENCOUNTER — Other Ambulatory Visit: Payer: Self-pay

## 2021-10-21 ENCOUNTER — Other Ambulatory Visit: Payer: Self-pay | Admitting: Pharmacist

## 2021-10-21 DIAGNOSIS — I10 Essential (primary) hypertension: Secondary | ICD-10-CM

## 2021-10-21 MED ORDER — ENALAPRIL MALEATE 10 MG PO TABS
ORAL_TABLET | ORAL | 0 refills | Status: DC
  Filled 2021-10-21: qty 270, 90d supply, fill #0

## 2021-10-22 ENCOUNTER — Other Ambulatory Visit: Payer: Self-pay

## 2021-10-24 ENCOUNTER — Other Ambulatory Visit: Payer: Self-pay

## 2021-10-29 ENCOUNTER — Telehealth: Payer: Self-pay

## 2021-10-29 NOTE — Telephone Encounter (Signed)
Patient would like a handicap sticker and in need of cone transportation for her future appointments.  ?

## 2021-10-30 NOTE — Telephone Encounter (Signed)
Note reviewed.  I last saw patient 05/2020.  We will assess need for handicap sticker on her upcoming visit with me. ?

## 2021-10-30 NOTE — Telephone Encounter (Signed)
Patient called regarding transportation to appointments. Advised to call insurance carriers- Medicare and Medicaid to arrange transportation.  ?If she was unable to get transportation we could look at other options available.  ? ?She inquired about handicap sticker with transportation. Informed that message would be sent to PCP to see if she will qualify for placard and will mail to address if requirements were met.  ? ?She has a f/u HTN apt 12/05/2021 with Dr. Wynetta Emery.  ? ?Patient verbalized understanding.  ? ?

## 2021-11-08 NOTE — Telephone Encounter (Signed)
Patient states Medicare and [place] Medicaid will not cover transportation, patient would like to know if there is other alternatives.  ? ?Informed patient PCP advised the need for the handicap sicker will be discussed at upcoming appointment. Patient states she relies on family and friends to take her to different appointments and to the pharmacy therefore she needs a handicap sticker.  ?

## 2021-11-12 ENCOUNTER — Other Ambulatory Visit: Payer: Self-pay | Admitting: Family Medicine

## 2021-11-12 ENCOUNTER — Other Ambulatory Visit: Payer: Self-pay

## 2021-11-12 DIAGNOSIS — I10 Essential (primary) hypertension: Secondary | ICD-10-CM

## 2021-11-12 MED ORDER — ENALAPRIL MALEATE 10 MG PO TABS: ORAL_TABLET | ORAL | 0 refills | Status: DC

## 2021-11-12 MED ORDER — METOPROLOL TARTRATE 25 MG PO TABS: ORAL_TABLET | Freq: Two times a day (BID) | ORAL | 0 refills | Status: DC

## 2021-11-12 NOTE — Telephone Encounter (Signed)
Call placed to patient, she said she contacted Medicaid and they told her that they wouldn't be able to provide transportation for her.  I explained to her that I need to determine if she has regular Medicaid or [employer], that would determine who she is to call.  She then said not to worry about it, she really just wants the handicap placard. I explained to her that Dr Wynetta Emery will need to assess her for that at her upcoming visit.  ? ?[facility]. She said today they told her that only enalapril was transferred and she is not sure why they only have 1 medication. I told her that [facility] would [facility] and check [facility] that tomorrow too.  ?

## 2021-11-12 NOTE — Telephone Encounter (Signed)
Medication Refill - Medication: enalapril (VASOTEC) 10 MG tablet ,metoprolol tartrate (LOPRESSOR) 25 MG tablet ? ?Pt requesting medication transfer. Pt has tried to reach [facility] multiple times with no answer.  ? ?Has the patient contacted their pharmacy? Yes.   No answer.  ?(Agent: If no, request that the patient contact the pharmacy for the refill. If patient does not wish to contact the pharmacy document the reason why and proceed with request.) ?(Agent: If yes, when and what did the pharmacy advise?) ? ?Preferred Pharmacy (with phone number or street name):  ?[id number] [place] [facility] [address] [address] ?Phone: [phone]: [phone] ?Hours: Not open 24 hours  ? ?Has the patient been seen for an appointment in the last year OR does the patient have an upcoming appointment? Yes.   ? ?Agent: Please be advised that RX refills may take up to 3 business days. We ask that you follow-up with your pharmacy.  ?

## 2021-11-12 NOTE — Telephone Encounter (Signed)
Requested Prescriptions  ?Pending Prescriptions Disp Refills  ?? enalapril (VASOTEC) 10 MG tablet 270 tablet 0  ?  Sig: TAKE 2 TABLETS (20 MG TOTAL) BY MOUTH EVERY MORNING AND 1 TABLET (10 MG TOTAL) AT BEDTIME.  ?  ? Cardiovascular:  ACE Inhibitors Failed - 11/12/2021  4:20 PM  ?  ?  Failed - Cr in normal range and within 180 days  ?  Creatinine  ?Date Value Ref Range Status  ?11/26/2020 0.94 0.44 - 1.00 mg/dL Final  ? ?Creat  ?Date Value Ref Range Status  ?10/02/2015 0.90 0.60 - 0.93 mg/dL Final  ? ?Creatinine, Ser  ?Date Value Ref Range Status  ?06/06/2021 1.04 (H) 0.57 - 1.00 mg/dL Final  ?   ?  ?  Failed - Last BP in normal range  ?  BP Readings from Last 1 Encounters:  ?06/06/21 (!) 170/90  ?   ?  ?  Passed - K in normal range and within 180 days  ?  Potassium  ?Date Value Ref Range Status  ?06/06/2021 4.2 3.5 - 5.2 mmol/L Final  ?   ?  ?  Passed - Patient is not pregnant  ?  ?  Passed - Valid encounter within last 6 months  ?  Recent Outpatient Visits   ?      ? 5 months ago Hypertension, uncontrolled  ? [place], [place], [place]  ? 1 year ago Hypertension, uncontrolled  ? [place] Ladell Pier, [place]  ? 1 year ago Hypertension, uncontrolled  ? [place] Ladell Pier, MD  ? 1 year ago Hypertension, uncontrolled  ? [place] Fulp, [place], [place]  ? 2 years ago Hypertension, uncontrolled  ? [place] Antony Blackbird, MD  ?  ?  ?Future Appointments   ?        ? In 3 weeks Ladell Pier, MD [place]  ?  ? ?  ?  ?  ?? metoprolol tartrate (LOPRESSOR) 25 MG tablet 180 tablet 0  ?  Sig: TAKE 1 TABLET (25 MG TOTAL) BY MOUTH 2 (TWO) TIMES DAILY.  ?  ? Cardiovascular:  Beta Blockers Failed - 11/12/2021  4:20 PM  ?  ?  Failed - Last BP in normal range  ?  BP Readings from Last 1 Encounters:  ?06/06/21 (!) 170/90  ?   ?   ?  Passed - Last Heart Rate in normal range  ?  Pulse Readings from Last 1 Encounters:  ?06/06/21 91  ?   ?  ?  Passed - Valid encounter within last 6 months  ?  Recent Outpatient Visits   ?      ? 5 months ago Hypertension, uncontrolled  ? [place], [place], [place]  ? 1 year ago Hypertension, uncontrolled  ? [place] Ladell Pier, MD  ? 1 year ago Hypertension, uncontrolled  ? [place] Ladell Pier, MD  ? 1 year ago Hypertension, uncontrolled  ? [place] Fulp, [place], [place]  ? 2 years ago Hypertension, uncontrolled  ? [place] Antony Blackbird, MD  ?  ?  ?Future Appointments   ?        ? In 3 weeks Ladell Pier,  MD [place]  ?  ? ?  ?  ?  ? ?

## 2021-11-12 NOTE — Telephone Encounter (Signed)
Requested medication (s) are due for refill today: yes ? ?Requested medication (s) are on the active medication list: yes ? ?Last refill:  10/21/21- 10/21/22 #270 0 refills ? ?Future visit scheduled: yes in 3 weeks ? ?Notes to clinic:  protocol failed. Last labs 11/26/20. Do you want to refill Rx? ? ? ?  ?Requested Prescriptions  ?Pending Prescriptions Disp Refills  ? enalapril (VASOTEC) 10 MG tablet 270 tablet 0  ?  Sig: TAKE 2 TABLETS (20 MG TOTAL) BY MOUTH EVERY MORNING AND 1 TABLET (10 MG TOTAL) AT BEDTIME.  ?  ? Cardiovascular:  ACE Inhibitors Failed - 11/12/2021  4:20 PM  ?  ?  Failed - Cr in normal range and within 180 days  ?  Creatinine  ?Date Value Ref Range Status  ?11/26/2020 0.94 0.44 - 1.00 mg/dL Final  ? ?Creat  ?Date Value Ref Range Status  ?10/02/2015 0.90 0.60 - 0.93 mg/dL Final  ? ?Creatinine, Ser  ?Date Value Ref Range Status  ?06/06/2021 1.04 (H) 0.57 - 1.00 mg/dL Final  ?  ?  ?  ?  Failed - Last BP in normal range  ?  BP Readings from Last 1 Encounters:  ?06/06/21 (!) 170/90  ?  ?  ?  ?  Passed - K in normal range and within 180 days  ?  Potassium  ?Date Value Ref Range Status  ?06/06/2021 4.2 3.5 - 5.2 mmol/L Final  ?  ?  ?  ?  Passed - Patient is not pregnant  ?  ?  Passed - Valid encounter within last 6 months  ?  Recent Outpatient Visits   ? ?      ? 5 months ago Hypertension, uncontrolled  ? [place], [place], [place]  ? 1 year ago Hypertension, uncontrolled  ? [place] Ladell Pier, [place]  ? 1 year ago Hypertension, uncontrolled  ? [place] Ladell Pier, [place]  ? 1 year ago Hypertension, uncontrolled  ? [place] Fulp, [place], [place]  ? 2 years ago Hypertension, uncontrolled  ? [place] Antony Blackbird, MD  ? ?  ?  ?Future Appointments   ? ?        ? In 3 weeks Ladell Pier, MD [place]   ? ?  ? ? ?  ?  ?  ?Signed Prescriptions Disp Refills  ? metoprolol tartrate (LOPRESSOR) 25 MG tablet 180 tablet 0  ?  Sig: TAKE 1 TABLET (25 MG TOTAL) BY MOUTH 2 (TWO) TIMES DAILY.  ?  ? Cardiovascular:  Beta Blockers Failed - 11/12/2021  4:20 PM  ?  ?  Failed - Last BP in normal range  ?  BP Readings from Last 1 Encounters:  ?06/06/21 (!) 170/90  ?  ?  ?  ?  Passed - Last Heart Rate in normal range  ?  Pulse Readings from Last 1 Encounters:  ?06/06/21 91  ?  ?  ?  ?  Passed - Valid encounter within last 6 months  ?  Recent Outpatient Visits   ? ?      ? 5 months ago Hypertension, uncontrolled  ? [place], [place], [place]  ? 1 year ago Hypertension, uncontrolled  ? [place] Ladell Pier, [place]  ? 1 year ago Hypertension,  uncontrolled  ? [place] Ladell Pier, [place]  ? 1 year ago Hypertension, uncontrolled  ? [place] Fulp, [place], [place]  ? 2 years ago Hypertension, uncontrolled  ? [place] Antony Blackbird, [place]  ? ?  ?  ?Future Appointments   ? ?        ? In 3 weeks Ladell Pier, MD [place]  ? ?  ? ? ?  ?  ?  ? ?

## 2021-11-13 ENCOUNTER — Other Ambulatory Visit: Payer: Self-pay

## 2021-11-13 NOTE — Telephone Encounter (Signed)
Per Maren Reamer, [facility] referral specialist, the patient has regular Medicaid. Epic has been updated.  ? ?[facility] and spoke to [employer]. He said that they have received all of the patient's prescriptions but it is too early to refill them.  ? ?Call placed to patient and informed her that [facility], it is just too early to fill them. She then said that [facility] and they provided her with a printout of medications they have on file.  ?I informed her that she has regular Medicaid and she can call [facility] if she needs a ride to medical appointments. She said that her niece told her not to worry about that, she doesn't need those rides.  The patient will speak to Dr Wynetta Emery about a handicap placard at her upcoming appointment  - 12/05/2021.  ?

## 2021-12-04 ENCOUNTER — Ambulatory Visit: Payer: Medicare Other | Admitting: Physician Assistant

## 2021-12-05 ENCOUNTER — Ambulatory Visit: Payer: Medicare Other | Attending: Internal Medicine | Admitting: Internal Medicine

## 2021-12-05 ENCOUNTER — Encounter: Payer: Self-pay | Admitting: Internal Medicine

## 2021-12-05 VITALS — BP 170/90 | HR 65 | Temp 99.1°F | Ht 64.0 in | Wt 175.6 lb

## 2021-12-05 DIAGNOSIS — Z79899 Other long term (current) drug therapy: Secondary | ICD-10-CM | POA: Diagnosis not present

## 2021-12-05 DIAGNOSIS — H3530 Unspecified macular degeneration: Secondary | ICD-10-CM | POA: Insufficient documentation

## 2021-12-05 DIAGNOSIS — Z8249 Family history of ischemic heart disease and other diseases of the circulatory system: Secondary | ICD-10-CM | POA: Diagnosis present

## 2021-12-05 DIAGNOSIS — Z86018 Personal history of other benign neoplasm: Secondary | ICD-10-CM

## 2021-12-05 DIAGNOSIS — H409 Unspecified glaucoma: Secondary | ICD-10-CM | POA: Insufficient documentation

## 2021-12-05 DIAGNOSIS — D693 Immune thrombocytopenic purpura: Secondary | ICD-10-CM | POA: Diagnosis not present

## 2021-12-05 DIAGNOSIS — E782 Mixed hyperlipidemia: Secondary | ICD-10-CM | POA: Diagnosis not present

## 2021-12-05 DIAGNOSIS — H268 Other specified cataract: Secondary | ICD-10-CM | POA: Insufficient documentation

## 2021-12-05 DIAGNOSIS — Z2821 Immunization not carried out because of patient refusal: Secondary | ICD-10-CM

## 2021-12-05 DIAGNOSIS — I10 Essential (primary) hypertension: Secondary | ICD-10-CM | POA: Insufficient documentation

## 2021-12-05 DIAGNOSIS — D229 Melanocytic nevi, unspecified: Secondary | ICD-10-CM | POA: Insufficient documentation

## 2021-12-05 DIAGNOSIS — B731 Onchocerciasis without eye disease: Secondary | ICD-10-CM | POA: Insufficient documentation

## 2021-12-05 MED ORDER — ALPRAZOLAM 0.25 MG PO TABS: ORAL_TABLET | ORAL | 0 refills | Status: DC

## 2021-12-05 NOTE — Progress Notes (Signed)
? ? ?Patient ID: Theresa [name], female    DOB: 12/01/1945  MRN: [id number] ? ?CC: OFFICE VISIT (Pt stated she is having surgery next month she would like refill for xanax. Pt is requesting handicap card also pt stated she has itching also left arm hurt.) ? ? ?Subjective: ?Theresa [name] is a 76 y.o. 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 Lindi Adie. ? ?Reports she has blurred vision whereby she does not see very well ?Hx of catarct, glaucoma and macular degeneration ?Had cataract extraction LT eye 01/2020. ?Will be having extraction RT eye 01/07/2022.  Request rxn for Xanax to take prior to surgery to help keep her calm.  Usually BP goes up; on one occasion the surgery had to be cancelled because of it ?-Request handicap sticker for her grand-daughter who usually drives her around.  Requesting 1 because of her vision and states that she sometimes stumbles around.  She has not had any falls. ? ?Hx of onchoceriasis:  started having feelings of things crawling under skin x 5-6 wks ago.she feels she is having a reoccurrence.  No rash or skin lesions but endorses itching.  Went to [place] 06/2021 and returned 09/23/2021.   ?Last saw Dr. Linus Salmons 06/2020 who recommended ivermectin every 6 months as needed.. ?  ? ?HTN:  on Enalapril 10 mg 2 in a.m and 1 p.m and Metoprolol 25 ng BID.  Took meds already for today.  She feels her blood pressure is always elevated in the doctor's office because she has a component of whitecoat hypertension. ?Not checking BP lately but states it is always good at home.   ? ?HL: she d/c Crestor even low dose because of leg cramps.  She stopped the med for over one mth.  She does not want to try another med  ? ?Complains of having two mole on the right forehead.  States that they were smaller but have increased in size over the past several months. ? ?HM:  Declines  Shingrix and Pneumonia vaccines.   Declines Dexa scan ?Patient Active Problem List  ? Diagnosis Date Noted  ? 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 Thrombocytopenia ([place]) 06/13/2019  ? Nuclear age-related cataract, both eyes 09/27/2018  ? Open angle with borderline findings and low glaucoma risk in both eyes 03/18/2018  ? Non-adherence to medical treatment 01/15/2017  ? Hyperlipidemia 06/09/2016  ? White coat syndrome with diagnosis of hypertension 10/02/2015  ? Insomnia 10/02/2015  ? Palpitations 10/02/2015  ?  ? ?Current Outpatient Medications on File Prior to Visit  ?Medication Sig Dispense Refill  ? acetaminophen (TYLENOL) 500 MG tablet Take 500 mg by mouth every 6 (six) hours as needed for mild pain or moderate pain.    ? bimatoprost (LUMIGAN) 0.01 % SOLN Place 1 drop into both eyes nightly.    ? bimatoprost (LUMIGAN) 0.01 % SOLN Instill 1 drop into both eyes every night 7.5 mL 3  ? brimonidine (ALPHAGAN) 0.2 % ophthalmic solution Instill 1 drop into both eyes twice a day 10 mL 3  ? brimonidine-timolol (COMBIGAN) 0.2-0.5 % ophthalmic solution Instill 1 drop into both eyes twice a day 45 mL 3  ? brimonidine-timolol (COMBIGAN) 0.2-0.5 % ophthalmic solution Instill 1 drop into both eyes twice a day 45 mL 3  ? folic acid (FOLVITE) 270 MCG tablet Take by  mouth.    ? metoprolol tartrate (LOPRESSOR) 25 MG tablet TAKE 1 TABLET (25 MG TOTAL) BY MOUTH 2 (TWO) TIMES DAILY. 180 tablet 0  ? Multiple Vitamin (MULTIVITAMIN) tablet Take 1 tablet by mouth daily.    ? Multiple Vitamins-Minerals (ZINC PO) Take by mouth.    ? Cholecalciferol (VITAMIN D3 PO) Take by mouth. (Patient not taking: Reported on 06/06/2021)    ? enalapril (VASOTEC) 10 MG tablet TAKE 2 TABLETS (20 MG TOTAL) BY MOUTH EVERY MORNING AND 1 TABLET (10 MG TOTAL) AT BEDTIME. (Patient not taking: Reported on 12/05/2021) 90 tablet 0  ? ?No current facility-administered medications on file prior to visit.   ? ? ?Allergies  ?Allergen Reactions  ? Amlodipine Swelling  ? Coreg [Carvedilol] Other (See Comments)  ?  Drowsiness, dizziness, palpitations  ? Shellfish Allergy Swelling  ? Trazodone And Nefazodone Swelling  ?  Possible eye pain and swelling  ? Hydralazine Hcl Palpitations  ? Augmentin [Amoxicillin-Pot Clavulanate]   ? Clonidine Derivatives Other (See Comments)  ?  "heart came out of chest"  ? Lisinopril Palpitations  ? Lisinopril-Hydrochlorothiazide Other (See Comments)  ?  Possible flushing, insomnia  ? ? ?Social History  ? ?Socioeconomic History  ? Marital status: Widowed  ?  Spouse name: Not on file  ? Number of children: 2  ? Years of education: Not on file  ? Highest education level: Not on file  ?Occupational History  ? Not on file  ?Tobacco Use  ? Smoking status: Never  ? Smokeless tobacco: Never  ?Vaping Use  ? Vaping Use: Never used  ?Substance and Sexual Activity  ? Alcohol use: No  ? Drug use: No  ? Sexual activity: Not Currently  ?  Birth control/protection: Post-menopausal  ?Other Topics Concern  ? Not on file  ?Social History Narrative  ? Not on file  ? ?Social Determinants of Health  ? ?Financial Resource Strain: Not on file  ?Food Insecurity: Not on file  ?Transportation Needs: Not on file  ?Physical Activity: Not on file  ?Stress: Not on file  ?Social Connections: Not on file  ?Intimate Partner Violence: Not on file  ? ? ?Family History  ?Problem Relation Age of Onset  ? Heart disease Mother   ? Hypertension Mother   ? Cancer Brother   ? ? ?Past Surgical History:  ?Procedure Laterality Date  ? APPENDECTOMY    ? BREAST LUMPECTOMY    ? CATARACT EXTRACTION Left 01/2020  ? ? ?ROS: ?Review of Systems ?Negative except as stated above ? ?PHYSICAL EXAM: ?BP (!) 170/90 (BP Location: Left Arm, Patient Position: Sitting)   Pulse 65   Temp 99.1 ?F (37.3 ?C)   Ht '5\' 4"'$  (1.626 m)   Wt 175 lb 9.6 oz (79.7 kg)   SpO2 100%   BMI 30.14 kg/m?   ?Physical Exam ?BP 180/100 ?General appearance - alert, well  appearing, elderly female and in no distress ?Mental status - normal mood, behavior, speech, dress, motor activity, and thought processes ?Neck - supple, no significant adenopathy ?Chest - clear to auscultation, no wheezes, rales or rhonchi, symmetric air entry ?Heart - normal rate, regular rhythm, normal S1, S2, no murmurs, rubs, clicks or gallops ?Neurological -gait is slow but steady.  She ambulates unassisted.  She transfers to exam table unassisted.  Power in the lower extremities 5/5 bilaterally. ?Extremities -no lower extremity edema. ?Skin -no lesions noted on the extremities.  She has two hyperpigmented macular moles versus keratosis on the right forehead. ? ? ? ?  Latest Ref Rng & Units 06/06/2021  ?  9:35 AM 11/26/2020  ?  3:11 PM 06/13/2020  ?  8:46 AM  ?CMP  ?Glucose 70 - 99 mg/dL 99   129     ?BUN 8 - 27 mg/dL 15   16     ?Creatinine 0.57 - 1.00 mg/dL 1.04   0.94     ?Sodium 134 - 144 mmol/L 136   141     ?Potassium 3.5 - 5.2 mmol/L 4.2   3.8     ?Chloride 96 - 106 mmol/L 103   104     ?CO2 20 - 29 mmol/L 25   28     ?Calcium 8.7 - 10.3 mg/dL 9.4   9.6     ?Total Protein 6.0 - 8.5 g/dL 7.6   8.3   7.4    ?Total Bilirubin 0.0 - 1.2 mg/dL 0.5   0.4   0.5    ?Alkaline Phos 44 - 121 IU/L 69   76   67    ?AST 0 - 40 IU/L '18   17   15    '$ ?ALT 0 - 32 IU/L '10   8   9    '$ ? ?Lipid Panel  ?   ?Component Value Date/Time  ? CHOL 263 (H) 06/06/2021 0935  ? TRIG 57 06/06/2021 0935  ? HDL 82 06/06/2021 0935  ? CHOLHDL 3.2 06/06/2021 0935  ? CHOLHDL 2.7 09/22/2019 0825  ? VLDL 8 09/22/2019 0825  ? LDLCALC 172 (H) 06/06/2021 0935  ? ? ?CBC ?   ?Component Value Date/Time  ? WBC 5.9 06/06/2021 0935  ? WBC 5.3 11/26/2020 1511  ? WBC 4.3 08/16/2018 1416  ? RBC 4.98 06/06/2021 0935  ? RBC 4.84 11/26/2020 1511  ? HGB 13.7 06/06/2021 0935  ? HCT 41.8 06/06/2021 0935  ? PLT 82 (LL) 06/06/2021 0935  ? MCV 84 06/06/2021 0935  ? MCH 27.5 06/06/2021 0935  ? MCH 27.7 11/26/2020 1511  ? MCHC 32.8 06/06/2021 0935  ? MCHC 33.3 11/26/2020  1511  ? RDW 13.1 06/06/2021 0935  ? LYMPHSABS 0.9 06/06/2021 0935  ? MONOABS 0.3 11/26/2020 1511  ? EOSABS 0.1 06/06/2021 0935  ? BASOSABS 0.1 06/06/2021 0935  ? ? ?ASSESSMENT AND PLAN: ? ?1. Hypertension, uncontrolled

## 2021-12-06 ENCOUNTER — Telehealth: Payer: Self-pay | Admitting: Internal Medicine

## 2021-12-06 DIAGNOSIS — I10 Essential (primary) hypertension: Secondary | ICD-10-CM

## 2021-12-06 LAB — BASIC METABOLIC PANEL
BUN/Creatinine Ratio: 11 — ABNORMAL LOW (ref 12–28)
BUN: 11 mg/dL (ref 8–27)
CO2: 27 mmol/L (ref 20–29)
Calcium: 9.6 mg/dL (ref 8.7–10.3)
Chloride: 101 mmol/L (ref 96–106)
Creatinine, Ser: 1.01 mg/dL — ABNORMAL HIGH (ref 0.57–1.00)
Glucose: 92 mg/dL (ref 70–99)
Potassium: 4.6 mmol/L (ref 3.5–5.2)
Sodium: 139 mmol/L (ref 134–144)
eGFR: 57 mL/min/{1.73_m2} — ABNORMAL LOW (ref >59)

## 2021-12-06 MED ORDER — ENALAPRIL MALEATE 20 MG PO TABS: 20.0000 mg | ORAL_TABLET | Freq: Two times a day (BID) | ORAL | 3 refills | Status: DC

## 2021-12-06 NOTE — Telephone Encounter (Signed)
Phone call placed to patient today to go over lab results.  Patient informed that her kidney function is not 100% but stable compared to when last checked.  We can go ahead and increase the enalapril to 20 mg twice a day.  Informed that I will send an updated prescription to her pharmacy.  Advised her to make sure and look at the bottle because it will be the 20 mg tablet and she will take 1 in the morning and 1 in the evening.  Patient expressed understanding. ?

## 2021-12-06 NOTE — Telephone Encounter (Signed)
Copied from [place] [phone]. Topic: General - Other ?>> Dec 06, 2021  9:33 AM Leward Quan A wrote: ?Reason for CRM: Patient called in to get her test results from her labs on 12/05/21. Asking for a call back at  Ph# [phone] ?

## 2021-12-12 ENCOUNTER — Other Ambulatory Visit: Payer: Self-pay

## 2021-12-12 ENCOUNTER — Ambulatory Visit (INDEPENDENT_AMBULATORY_CARE_PROVIDER_SITE_OTHER): Payer: Medicare Other | Admitting: Internal Medicine

## 2021-12-12 ENCOUNTER — Encounter: Payer: Self-pay | Admitting: Internal Medicine

## 2021-12-12 DIAGNOSIS — L299 Pruritus, unspecified: Secondary | ICD-10-CM

## 2021-12-12 DIAGNOSIS — I10 Essential (primary) hypertension: Secondary | ICD-10-CM | POA: Diagnosis not present

## 2021-12-12 DIAGNOSIS — B731 Onchocerciasis without eye disease: Secondary | ICD-10-CM

## 2021-12-12 NOTE — Progress Notes (Signed)
   Subjective:    Patient ID: Theresa [name], female    DOB: 03/04/1946, 76 y.o.   MRN: [id number]  HPI Here for follow up evaluation of concern for a parasitic infection.   I last saw her in December 2021 after several visits for her concern for ongoing onchocerciasis infection of the skin.  She had been diagnosed in [place] by a skin snip by her report and treated with ivermectin.  She was getting ivermectin every 6-12 months, though had been asymptomatic.  She had been in the [place] over 15 years.  She reported 'feeling' parasites under the skin.  No rash or signs present otherwise.  I did offer doxycycline but she refused to take it, insisting on ivermectin.   She returns now after travel back to [place] for about 3 months and again 'feeling' parasites.  No rash, no nodules or other signs, just itching all over and reports she can feel them moving.  Not in one particular area but all over, she feels them moving.  She is asking to get a skin snip for diagnosis.     Review of Systems  Constitutional:  Negative for fatigue.  Gastrointestinal:  Negative for diarrhea.  Skin:  Negative for rash.      Objective:   Physical Exam Eyes:     General: No scleral icterus. Skin:    Findings: No rash.  Neurological:     General: No focal deficit present.     Mental Status: She is alert.  Psychiatric:        Mood and Affect: Mood normal.   SH: no tobacco       Assessment & Plan:

## 2021-12-13 ENCOUNTER — Encounter: Payer: Self-pay | Admitting: Internal Medicine

## 2021-12-13 NOTE — Assessment & Plan Note (Signed)
I am not sure of the cause of her diffuse itching.  No signs of infection.  She will further discuss this with her PCP.

## 2021-12-13 NOTE — Assessment & Plan Note (Signed)
She has a history of this diagnosed in [place] and had been on treatment well past the life cycle.  As discussed at her last visit, no indication for further treatment for this at this point.  She did continue to express her desire for ivermectin but this was not advised and not prescribed for her.   Additionally, there is no signs of a new infection with no rash or other visible signs and diffuse itching would not be a good indication for new inffection. She should return if she develops a new area of concern including a rash and visible signs of infection.  Otherwise can come as needed.

## 2021-12-13 NOTE — Assessment & Plan Note (Signed)
BP wnl.   

## 2022-01-10 ENCOUNTER — Ambulatory Visit: Payer: Self-pay

## 2022-01-10 NOTE — Telephone Encounter (Signed)
Summary: Swelling   Pt called to report that she has swelling, pain, and inflammation in her eyes. She just had a procedure on her eyes   Best contact: [phone]     Dr Katy Fitch- glaucoma and swelling Already contacted Dr. Katy Fitch about her eyes and knows what he advised. Pt actually called to change appt for BP check to 3 weeks later than appt. Pt re-scheduled with pharmacist 02/17/2022 at 1100.

## 2022-01-14 ENCOUNTER — Ambulatory Visit: Payer: Medicare Other | Admitting: Pharmacist

## 2022-01-21 ENCOUNTER — Other Ambulatory Visit: Payer: Self-pay | Admitting: Internal Medicine

## 2022-01-21 DIAGNOSIS — I10 Essential (primary) hypertension: Secondary | ICD-10-CM

## 2022-01-22 NOTE — Telephone Encounter (Signed)
Requested Prescriptions  Pending Prescriptions Disp Refills  . enalapril (VASOTEC) 20 MG tablet [Pharmacy Med Name: ENALAPRIL MALEATE 20 MG TAB] 180 tablet 0    Sig: TAKE 1 TABLET BY MOUTH TWICE A DAY     Cardiovascular:  ACE Inhibitors Failed - 01/21/2022  5:54 PM      Failed - Cr in normal range and within 180 days    Creatinine  Date Value Ref Range Status  11/26/2020 0.94 0.44 - 1.00 mg/dL Final   Creat  Date Value Ref Range Status  10/02/2015 0.90 0.60 - 0.93 mg/dL Final   Creatinine, Ser  Date Value Ref Range Status  12/05/2021 1.01 (H) 0.57 - 1.00 mg/dL Final         Passed - K in normal range and within 180 days    Potassium  Date Value Ref Range Status  12/05/2021 4.6 3.5 - 5.2 mmol/L Final         Passed - Patient is not pregnant      Passed - Last BP in normal range    BP Readings from Last 1 Encounters:  12/12/21 130/60         Passed - Valid encounter within last 6 months    Recent Outpatient Visits          1 month ago Hypertension, uncontrolled   [place], [place]   7 months ago Hypertension, uncontrolled   [place], [place]   1 year ago Hypertension, uncontrolled   [place], Deborah B, [place]   1 year ago Hypertension, uncontrolled   [place], Deborah B, [place]   2 years ago Hypertension, uncontrolled   [place] Antony Blackbird, MD      Future Appointments            In 3 weeks Tresa Endo, [place]

## 2022-02-03 ENCOUNTER — Ambulatory Visit: Payer: Self-pay | Admitting: *Deleted

## 2022-02-03 NOTE — Telephone Encounter (Signed)
  Chief Complaint: leg cramps Symptoms: since increase Vasotec Frequency: frequently Pertinent Negatives: Patient denies high blood pressure Disposition: '[]'$ ED /'[]'$ Urgent Care (no appt availability in office) / '[]'$ Appointment(In office/virtual)/ '[]'$  [facility]/ '[]'$ Home Care/ '[]'$ Refused Recommended Disposition /'[]'$ [facility]/ '[x]'$  Follow-up with PCP Additional Notes: Since increased Vasotec to twice a day she is having leg cramps and they are getting worse. Her last 3 BP readings at home were 142/81, 130/72, 120/73. Pt wants to decrease back to once a day. I explained she could have electrolyte imbalance and we would have to ask Dr. Wynetta Emery about frequency of medication and if she needs lab work.  Reason for Disposition  Leg pain  Answer Assessment - Initial Assessment Questions 1. ONSET: "When did the pain start?"      Cramps after starting  2. LOCATION: "Where is the pain located?"      Leg cramps, both 3. PAIN: "How bad is the pain?"    (Scale 1-10; or mild, moderate, severe)   -  MILD (1-3): doesn't interfere with normal activities    -  MODERATE (4-7): interferes with normal activities (e.g., work or school) or awakens from sleep, limping    -  SEVERE (8-10): excruciating pain, unable to do any normal activities, unable to walk     moderate 4. WORK OR EXERCISE: "Has there been any recent work or exercise that involved this part of the body?"      no 5. CAUSE: "What do you think is causing the leg pain?"     Change in Vasotec 6. OTHER SYMPTOMS: "Do you have any other symptoms?" (e.g., chest pain, back pain, breathing difficulty, swelling, rash, fever, numbness, weakness)     cramps 7. PREGNANCY: "Is there any chance you are pregnant?" "When was your last menstrual period?"     na  Protocols used: Leg Pain-A-AH

## 2022-02-17 ENCOUNTER — Ambulatory Visit: Payer: Medicare Other | Admitting: Pharmacist

## 2022-03-04 ENCOUNTER — Other Ambulatory Visit (HOSPITAL_COMMUNITY): Payer: Self-pay

## 2022-03-25 ENCOUNTER — Encounter: Payer: Self-pay | Admitting: Pharmacist

## 2022-03-25 ENCOUNTER — Ambulatory Visit: Payer: Medicare Other | Attending: Internal Medicine | Admitting: Pharmacist

## 2022-03-25 VITALS — BP 162/100

## 2022-03-25 DIAGNOSIS — I10 Essential (primary) hypertension: Secondary | ICD-10-CM | POA: Insufficient documentation

## 2022-03-25 DIAGNOSIS — Z79899 Other long term (current) drug therapy: Secondary | ICD-10-CM | POA: Insufficient documentation

## 2022-03-25 DIAGNOSIS — R002 Palpitations: Secondary | ICD-10-CM | POA: Diagnosis not present

## 2022-03-25 DIAGNOSIS — G4700 Insomnia, unspecified: Secondary | ICD-10-CM | POA: Diagnosis not present

## 2022-03-25 MED ORDER — CLONIDINE 0.1 MG/24HR TD PTWK: 0.1000 mg | MEDICATED_PATCH | TRANSDERMAL | 2 refills | Status: DC

## 2022-03-25 NOTE — Progress Notes (Signed)
S:     No chief complaint on file.  Theresa [name] is a 76 y.o. female who presents for hypertension evaluation, education, and management. Patient arrives in good spirits.   PMH is significant for HTN, insomnia, palpitations, multiple intolerances to different antihypertensive classes.  Patient was referred and last seen by Primary Care Provider, Dr. Wynetta Emery, on 12/05/2021.   At that visit, Dr. Wynetta Emery got labs and increased pt to enalapril '20mg'$  BID d/t pt's BP being above goal.   Today, patient arrives in good spirits and presents without assistance. Denies dizziness, headache, blurred vision, swelling.   Patient reports hypertension is longstanding. She has been taking 20 mg of enalapril before bedtime but is only taking 10 mg (1/2 of the '20mg'$  tablet) in the morning because she believes the 20 mg AM dose is too much for her. This causes palpitations.  Patient reports adherence with medications but takes enalapril differently than prescribed.  Current BP Medications include:   - Metoprolol tartrate 25 mg BID - Enalapril '20mg'$  BID (taking 10 mg in the morning, 20 mg in the evening)  Antihypertensives tried in the past include:  - Amlodipine (swelling) - Candesartan (palpitations) - Carvedilol (excessive drowsiness, dizziness and palpitations) - Chlorthalidone (palpitations, dizziness) - PO clonidine (dizziness)  - HCTZ (dizziness, palpitations)  - Hydralazine (palpitations) - lisinopril (palpitatons) - Lisinopril-HCTZ (flushing and insomnia) - Spironolactone (dizziness, palpitations) - Valsartan (palpitations)  Dietary habits include: limits salt, drinks 1 cup of green tea daily Exercise habits include: mobility is limited - she is able to ambulate but does not exercise Family / Social history:  - FHx: heart disease (mother) - Tobacco: never smoker - Alcohol: no alcohol  Home BP readings: no log with her today.  O:  Vitals:   03/25/22 1244  BP: (!) 162/100   Last  3 Office BP readings: BP Readings from Last 3 Encounters:  03/25/22 (!) 162/100  12/12/21 130/60  12/05/21 (!) 170/90   BMET    Component Value Date/Time   NA 139 12/05/2021 1111   K 4.6 12/05/2021 1111   CL 101 12/05/2021 1111   CO2 27 12/05/2021 1111   GLUCOSE 92 12/05/2021 1111   GLUCOSE 129 (H) 11/26/2020 1511   BUN 11 12/05/2021 1111   CREATININE 1.01 (H) 12/05/2021 1111   CREATININE 0.94 11/26/2020 1511   CREATININE 0.90 10/02/2015 1204   CALCIUM 9.6 12/05/2021 1111   GFRNONAA >60 11/26/2020 1511   GFRAA 66 11/23/2019 0945   GFRAA >60 09/22/2019 0825    Renal function: CrCl cannot be calculated (Patient's most recent lab result is older than the maximum 21 days allowed.).  Clinical ASCVD: No  The 10-year ASCVD risk score (Arnett DK, et al., 2019) is: 61.9%   Values used to calculate the score:     Age: 77 years     Sex: Female     Is Non-Hispanic African American: Yes     Diabetic: Yes     Tobacco smoker: No     Systolic Blood Pressure: 884 mmHg     Is BP treated: Yes     HDL Cholesterol: 82 mg/dL     Total Cholesterol: 263 mg/dL  A/P: Hypertension longstanding currently uncontrolled on current medications. BP goal < 130/80 mmHg. Medication adherence appears appropriate but she is multiple agent/anti-hypertensive drug class intolerances.  -Started clonidine 0.1 mg/24 hour patch once weekly.  -Continue enalapril and metoprolol at current doses.  -Patient educated on purpose, proper use, and potential adverse  effects of clonidine transdermal.  -F/u labs ordered - none today. -Counseled on lifestyle modifications for blood pressure control including reduced dietary sodium, increased exercise, adequate sleep. -Encouraged patient to check BP at home and bring log of readings to next visit. Counseled on proper use of home BP cuff.    Results reviewed and written information provided.    Written patient instructions provided. Patient verbalized understanding of  treatment plan.  Total time in face to face counseling 30 minutes.    Follow-up:  Pharmacist in 1 month.  Benard Halsted, PharmD, [place], [place] [phone]

## 2022-03-26 ENCOUNTER — Telehealth: Payer: Self-pay | Admitting: Internal Medicine

## 2022-03-26 ENCOUNTER — Ambulatory Visit: Payer: Self-pay

## 2022-03-26 NOTE — Telephone Encounter (Signed)
See Nurse Triage encounter addressing this.

## 2022-03-26 NOTE — Telephone Encounter (Signed)
Pt called in for assistance. Pt says that she was seen by Lurena Joiner and given a patch for her blood pressure. Pt says that she has a few questions about the patch and would like to discuss further.    Please advise.

## 2022-03-26 NOTE — Telephone Encounter (Signed)
  Chief Complaint: medication concerns Symptoms: NA Frequency: today Pertinent Negatives: NA Disposition: '[]'$ ED /'[]'$ Urgent Care (no appt availability in office) / '[]'$ Appointment(In office/virtual)/ '[]'$  [facility]/ '[]'$ Home Care/ '[]'$ Refused Recommended Disposition /'[]'$ [facility]/ '[x]'$  Follow-up with PCP Additional Notes: called and spoke with pt. She has concerns about taking clonidine patch and would like to speak with Lurena Joiner, [facility] directly. Tried calling his # but not available. Advised pt I would send a message to him to have him fu with her today if possible.   Summary: discuss medication   Pt called in for assistance. Pt says that she was seen by Lurena Joiner and given a patch for her blood pressure. Pt says that she has a few questions about the patch and would like to discuss further.        Please advise.      Reason for Disposition  [1] Caller has URGENT medicine question about med that PCP or specialist prescribed AND [2] triager unable to answer question  Answer Assessment - Initial Assessment Questions 1. NAME of MEDICINE: "What medicine(s) are you calling about?"     Clonidine  2. QUESTION: "What is your question?" (e.g., double dose of medicine, side effect)     Wants to speak with [facility] directly.  3. PRESCRIBER: "Who prescribed the medicine?" Reason: if prescribed by specialist, call should be referred to that group.     Dr. Wynetta Emery  Protocols used: Medication Question Call-A-AH

## 2022-04-04 ENCOUNTER — Ambulatory Visit: Payer: Medicare Other | Attending: Internal Medicine | Admitting: Pharmacist

## 2022-04-04 VITALS — BP 186/84 | HR 60

## 2022-04-04 DIAGNOSIS — R002 Palpitations: Secondary | ICD-10-CM | POA: Insufficient documentation

## 2022-04-04 DIAGNOSIS — G4700 Insomnia, unspecified: Secondary | ICD-10-CM | POA: Insufficient documentation

## 2022-04-04 DIAGNOSIS — I10 Essential (primary) hypertension: Secondary | ICD-10-CM | POA: Diagnosis present

## 2022-04-04 MED ORDER — ENALAPRIL MALEATE 10 MG PO TABS: ORAL_TABLET | ORAL | 1 refills | Status: DC

## 2022-04-04 NOTE — Progress Notes (Signed)
S:     No chief complaint on file.  Theresa [name] is a 76 y.o. female who presents for hypertension evaluation, education, and management. Patient arrives in good spirits.   PMH is significant for HTN, insomnia, palpitations, multiple intolerances to different antihypertensive classes.  Patient was referred and last seen by Primary Care Provider, Dr. Wynetta Emery, on 12/05/2021. I saw her on 03/25/2022 and tried her on a clonidine patch. She has a history of medication intolerances to multiple antihypertensives.   Today, patient arrives in good spirits and presents without assistance. Denies dizziness, headache, blurred vision, swelling. Unfortunately, she developed palpitations and dizziness on the clonidine patch ~3 days after starting. She requests to stop this today.   Patient reports adherence with medications. Stopped the clonidine patch d/t side effects.   Current BP Medications include:   - clonidine 0.1 mg/24 hr patch, weekly (stopped) - Metoprolol tartrate 25 mg BID - Enalapril '20mg'$  BID (taking 10 mg in the morning, 20 mg in the evening)  Antihypertensives tried in the past include:  - Amlodipine (swelling) - Candesartan (palpitations) - Carvedilol (excessive drowsiness, dizziness and palpitations) - Chlorthalidone (palpitations, dizziness) - PO clonidine (dizziness)  - Clonidine transdermal (dizziness, palpitations) - HCTZ (dizziness, palpitations)  - Hydralazine (palpitations) - lisinopril (palpitatons) - Lisinopril-HCTZ (flushing and insomnia) - Spironolactone (dizziness, palpitations) - Valsartan (palpitations)  Dietary habits include: limits salt, drinks 1 cup of green tea daily Exercise habits include: mobility is limited - she is able to ambulate but does not exercise Family / Social history:  - FHx: heart disease (mother) - Tobacco: never smoker - Alcohol: no alcohol  Home BP readings:  -150s/80s at home since last visit   O:  Vitals:   04/04/22 0919  04/04/22 0937  BP: (!) 196/99 (!) 186/84  Pulse: 61 60    Last 3 Office BP readings: BP Readings from Last 3 Encounters:  04/04/22 (!) 186/84  03/25/22 (!) 162/100  12/12/21 130/60   BMET    Component Value Date/Time   NA 139 12/05/2021 1111   K 4.6 12/05/2021 1111   CL 101 12/05/2021 1111   CO2 27 12/05/2021 1111   GLUCOSE 92 12/05/2021 1111   GLUCOSE 129 (H) 11/26/2020 1511   BUN 11 12/05/2021 1111   CREATININE 1.01 (H) 12/05/2021 1111   CREATININE 0.94 11/26/2020 1511   CREATININE 0.90 10/02/2015 1204   CALCIUM 9.6 12/05/2021 1111   GFRNONAA >60 11/26/2020 1511   GFRAA 66 11/23/2019 0945   GFRAA >60 09/22/2019 0825    Renal function: CrCl cannot be calculated (Patient's most recent lab result is older than the maximum 21 days allowed.).  Clinical ASCVD: No  The 10-year ASCVD risk score (Arnett DK, et al., 2019) is: 68.7%   Values used to calculate the score:     Age: 16 years     Sex: Female     Is Non-Hispanic African American: Yes     Diabetic: Yes     Tobacco smoker: No     Systolic Blood Pressure: 440 mmHg     Is BP treated: Yes     HDL Cholesterol: 82 mg/dL     Total Cholesterol: 263 mg/dL  A/P: Hypertension longstanding currently uncontrolled on current medications. BP goal < 130/80 mmHg. Medication adherence appears suboptimal and she has multiple anti-hypertensive drug class intolerances.  -Continue enalapril and metoprolol at current doses.  -Stop clonidine.  -Refer to advanced HTN clinic at [facility].  -F/u labs ordered - none  today. -Counseled on lifestyle modifications for blood pressure control including reduced dietary sodium, increased exercise, adequate sleep. -Encouraged patient to check BP at home and bring log of readings to next visit. Counseled on proper use of home BP cuff.    Results reviewed and written information provided.    Written patient instructions provided. Patient verbalized understanding of treatment plan.  Total  time in face to face counseling 30 minutes.    Follow-up:  With PCP.  Benard Halsted, PharmD, [facility], [place] [phone]

## 2022-04-05 ENCOUNTER — Telehealth: Payer: Self-pay | Admitting: Internal Medicine

## 2022-04-05 DIAGNOSIS — I10 Essential (primary) hypertension: Secondary | ICD-10-CM

## 2022-04-05 NOTE — Telephone Encounter (Signed)
-----   Message from Tresa Endo, RPH-CPP sent at 04/04/2022 11:16 AM EDT ----- Marykay Lex Dr. Wynetta Emery,   This patient has not had insurance in the past but does now. Can we refer her to the HTN clinic at [facility]? She has SO many intolerances but high BP. Out of a last ditch effort, I tried her on a clonidine patch ~1-2 weeks ago and she could not tolerate it.

## 2022-04-07 ENCOUNTER — Other Ambulatory Visit: Payer: Self-pay | Admitting: Internal Medicine

## 2022-04-07 DIAGNOSIS — I10 Essential (primary) hypertension: Secondary | ICD-10-CM

## 2022-04-07 MED ORDER — METOPROLOL TARTRATE 25 MG PO TABS: ORAL_TABLET | Freq: Two times a day (BID) | ORAL | 0 refills | Status: DC

## 2022-04-07 NOTE — Telephone Encounter (Signed)
Requested Prescriptions  Pending Prescriptions Disp Refills  . metoprolol tartrate (LOPRESSOR) 25 MG tablet 180 tablet 0    Sig: TAKE 1 TABLET (25 MG TOTAL) BY MOUTH 2 (TWO) TIMES DAILY.     Cardiovascular:  Beta Blockers Failed - 04/07/2022 11:45 AM      Failed - Last BP in normal range    BP Readings from Last 1 Encounters:  04/04/22 (!) 186/84         Passed - Last Heart Rate in normal range    Pulse Readings from Last 1 Encounters:  04/04/22 60         Passed - Valid encounter within last 6 months    Recent Outpatient Visits          3 days ago Hypertension, uncontrolled   [place], Jarome Matin, RPH-CPP   1 week ago Hypertension, uncontrolled   [place], Jarome Matin, RPH-CPP   4 months ago Hypertension, uncontrolled   [place], MD   10 months ago Hypertension, uncontrolled   [place], [place], [place]   1 year ago Hypertension, uncontrolled   [facility] Ladell Pier, MD

## 2022-04-07 NOTE — Telephone Encounter (Signed)
Copied from [place] [id number]. Topic: General - Other >> Apr 07, 2022  9:22 AM Everette C wrote: Reason for CRM: Medication Refill - Medication: metoprolol tartrate (LOPRESSOR) 25 MG tablet [[id number]] - patient has 3 tablets remaining   Has the patient contacted their pharmacy? Yes.  The patient hs ares that they had an appointment with Dr. Lurena Joiner on Friday and forgot to request the refill, they have been in contact with their pharmacy as well  (Agent: If no, request that the patient contact the pharmacy for the refill. If patient does not wish to contact the pharmacy document the reason why and proceed with request.) (Agent: If yes, when and what did the pharmacy advise?)  Preferred Pharmacy (with phone number or street name): [id number] [place]: [phone]: [phone]: Not open 24 hours   Has the patient been seen for an appointment in the last year OR does the patient have an upcoming appointment? Yes.    Agent: Please be advised that RX refills may take up to 3 business days. We ask that you follow-up with your pharmacy.

## 2022-05-06 ENCOUNTER — Ambulatory Visit: Payer: Medicare Other | Admitting: Pharmacist

## 2022-06-06 ENCOUNTER — Other Ambulatory Visit: Payer: Self-pay | Admitting: Internal Medicine

## 2022-06-06 DIAGNOSIS — I10 Essential (primary) hypertension: Secondary | ICD-10-CM

## 2022-06-06 NOTE — Telephone Encounter (Signed)
Medication Refill - Medication: enalapril (VASOTEC) 10 MG tablet and metoprolol tartrate (LOPRESSOR) 25 MG tablet   Has the patient contacted their pharmacy? Yes.   Pt told to contact the provider  Preferred Pharmacy (with phone number or street name):  [id number] [place]: [phone] Fax: [phone]    Has the patient been seen for an appointment in the last year OR does the patient have an upcoming appointment? Yes.    Agent: Please be advised that RX refills may take up to 3 business days. We ask that you follow-up with your pharmacy.

## 2022-06-06 NOTE — Telephone Encounter (Signed)
Rx: enalapril 04/04/22 #270 1RF- too soon      Metoprolol 04/07/22 #180- too soon Requested Prescriptions  Pending Prescriptions Disp Refills   enalapril (VASOTEC) 10 MG tablet 270 tablet 1    Sig: Take 1 tablet (10 mg ) in the morning and 2 tablets ('20mg'$ ) in the evening.     Cardiovascular:  ACE Inhibitors Failed - 06/06/2022 12:30 PM      Failed - Cr in normal range and within 180 days    Creatinine  Date Value Ref Range Status  11/26/2020 0.94 0.44 - 1.00 mg/dL Final   Creat  Date Value Ref Range Status  10/02/2015 0.90 0.60 - 0.93 mg/dL Final   Creatinine, Ser  Date Value Ref Range Status  12/05/2021 1.01 (H) 0.57 - 1.00 mg/dL Final         Failed - K in normal range and within 180 days    Potassium  Date Value Ref Range Status  12/05/2021 4.6 3.5 - 5.2 mmol/L Final         Failed - Last BP in normal range    BP Readings from Last 1 Encounters:  04/04/22 (!) 186/84         Passed - Patient is not pregnant      Passed - Valid encounter within last 6 months    Recent Outpatient Visits           2 months ago Hypertension, uncontrolled   [place], Jarome Matin, RPH-CPP   2 months ago Hypertension, uncontrolled   [place], Jarome Matin, RPH-CPP   6 months ago Hypertension, uncontrolled   [place], Deborah B, MD   1 year ago Hypertension, uncontrolled   [place], [place], [place]   2 years ago Hypertension, uncontrolled   [place], MD       Future Appointments             In 2 weeks Skeet Latch, MD [facility]             metoprolol tartrate (LOPRESSOR) 25 MG tablet 180 tablet 0    Sig: TAKE 1 TABLET (25 MG TOTAL) BY MOUTH 2 (TWO) TIMES DAILY.     Cardiovascular:  Beta Blockers Failed - 06/06/2022 12:30 PM       Failed - Last BP in normal range    BP Readings from Last 1 Encounters:  04/04/22 (!) 186/84         Passed - Last Heart Rate in normal range    Pulse Readings from Last 1 Encounters:  04/04/22 60         Passed - Valid encounter within last 6 months    Recent Outpatient Visits           2 months ago Hypertension, uncontrolled   [place], Jarome Matin, RPH-CPP   2 months ago Hypertension, uncontrolled   [place], Jarome Matin, RPH-CPP   6 months ago Hypertension, uncontrolled   [place], MD   1 year ago Hypertension, uncontrolled   [place], [place], [place]   2 years ago Hypertension, uncontrolled   [facility] Lad[place], MD  Future Appointments             In 2 weeks Skeet Latch, MD [facility]

## 2022-06-13 ENCOUNTER — Other Ambulatory Visit: Payer: Self-pay | Admitting: Internal Medicine

## 2022-06-13 DIAGNOSIS — I10 Essential (primary) hypertension: Secondary | ICD-10-CM

## 2022-06-13 NOTE — Telephone Encounter (Signed)
Requested Prescriptions  Pending Prescriptions Disp Refills   cloNIDine (CATAPRES - DOSED IN MG/24 HR) 0.1 mg/24hr patch [Pharmacy Med Name: CLONIDINE 0.1 MG/DAY PATCH] 12 patch 0    Sig: PLACE 1 PATCH (0.1 MG TOTAL) ONTO THE SKIN ONCE A WEEK.     Cardiovascular:  Alpha-2 Agonists Failed - 06/13/2022  2:38 AM      Failed - Last BP in normal range    BP Readings from Last 1 Encounters:  04/04/22 (!) 186/84         Passed - Last Heart Rate in normal range    Pulse Readings from Last 1 Encounters:  04/04/22 60         Passed - Valid encounter within last 6 months    Recent Outpatient Visits           2 months ago Hypertension, uncontrolled   [place], Jarome Matin, RPH-CPP   2 months ago Hypertension, uncontrolled   [place], Jarome Matin, RPH-CPP   6 months ago Hypertension, uncontrolled   [place], [place]   1 year ago Hypertension, uncontrolled   [place], [place]   2 years ago Hypertension, uncontrolled   [place], Deborah B, MD       Future Appointments             In 1 week Skeet Latch, MD [facility]

## 2022-06-13 NOTE — Telephone Encounter (Unsigned)
Copied from [place] [id number]. Topic: General - Inquiry >> Jun 13, 2022 11:32 AM Penni Bombard wrote: Reason for CRM: pt needs a call back regarding her prescriptions.  She said she needs to speak to a nurse.  [phone]

## 2022-06-24 NOTE — Progress Notes (Incomplete)
Advanced Hypertension Clinic Initial Assessment:    Date:  06/24/2022   ID:  Theresa [name], DOB Nov 03, 1945, MRN [id number]  PCP:  Ladell Pier, MD  Cardiologist:  None  Nephrologist:  Referring MD: Ladell Pier, MD   CC: Hypertension  History of Present Illness:    Theresa [name] is a 76 y.o. female with a hx of hypertension, hyperlipidemia, gastric ulcer, and river blindness, here to establish care in the Advanced Hypertension Clinic.   She was last seen 04/04/22 by pharmacist Vinson Moselle Ausdall for her longstanding hypertension with many medication intolerances. Her blood pressure was 186/84 on clonidine, metoprolol, and enalapril. Her at home BP averaged 150s/80s. At that visit clonidine was discontinued as she reported developing palpitations and dizziness 3 days after starting clonidine patch 0.1 mg in 02/2022.   Today,  She denies any palpitations, chest pain, shortness of breath, or peripheral edema. No lightheadedness, headaches, syncope, orthopnea, or PND.  (+)  Previous antihypertensives: Amlodipine (swelling) Candesartan (palpitations) Carvedilol (excessive drowsiness, dizziness and palpitations) Chlorthalidone (palpitations, dizziness) PO clonidine (dizziness)  Clonidine transdermal (dizziness, palpitations) HCTZ (dizziness, palpitations)  Hydralazine (palpitations) lisinopril (palpitatons) Lisinopril-HCTZ (flushing and insomnia) Spironolactone (dizziness, palpitations) Valsartan (palpitations)  Secondary Causes of Hypertension  Medications/Herbal: OCP, steroids, stimulants, antidepressants, weight loss medication, immune suppressants, NSAIDs, sympathomimetics, alcohol, caffeine, licorice, ginseng, St. John's wort, chemo  Sleep Apnea Renal artery stenosis Hyperaldosteronism Hyper/hypothyroidism Pheochromocytoma: palpitations, tachycardia, headache, diaphoresis (plasma metanephrines) Cushing's syndrome: Cushingoid facies, central obesity,  proximal muscle weakness, and ecchymoses, adrenal incidentaloma (cortisol) Coarctation of the aorta  Past Medical History:  Diagnosis Date   Gastric ulcer    Hyperlipidemia    Hypertension    River blindness    Sinusitis     Past Surgical History:  Procedure Laterality Date   APPENDECTOMY     BREAST LUMPECTOMY     CATARACT EXTRACTION Left 01/2020    Current Medications: No outpatient medications have been marked as taking for the 06/26/22 encounter (Appointment) with Skeet Latch, MD.     Allergies:   Amlodipine, Coreg [carvedilol], Shellfish allergy, Trazodone and nefazodone, Hydralazine hcl, Augmentin [amoxicillin-pot clavulanate], Clonidine derivatives, Lisinopril, and Lisinopril-hydrochlorothiazide   Social History   Socioeconomic History   Marital status: Widowed    Spouse name: Not on file   Number of children: 2   Years of education: Not on file   Highest education level: Not on file  Occupational History   Not on file  Tobacco Use   Smoking status: Never   Smokeless tobacco: Never  Vaping Use   Vaping Use: Never used  Substance and Sexual Activity   Alcohol use: No   Drug use: No   Sexual activity: Not Currently    Birth control/protection: Post-menopausal  Other Topics Concern   Not on file  Social History Narrative   Not on file   Social Determinants of Health   Financial Resource Strain: Not on file  Food Insecurity: Not on file  Transportation Needs: Not on file  Physical Activity: Not on file  Stress: Not on file  Social Connections: Not on file     Family History: The patient's family history includes Cancer in her brother; Heart disease in her mother; Hypertension in her mother.  ROS:   Please see the history of present illness.     All other systems reviewed and are negative.  EKGs/Labs/Other Studies Reviewed:    Bilateral Renal Artery Doppler  11/01/2015: Summary:  Findings suggest 1-59% renal artery stenosis. There  is  evidence of  elevated resistive indices in bilateral intrarenal arteries.  Incidental finding: there is evidence of multiple simple cysts in  the left kidney, largest measuring 1.14cm.   EKG:  EKG is personally reviewed. 06/24/2022: Sinus ***. Rate *** bpm.  Recent Labs: 12/05/2021: BUN 11; Creatinine, Ser 1.01; Potassium 4.6; Sodium 139   Recent Lipid Panel    Component Value Date/Time   CHOL 263 (H) 06/06/2021 0935   TRIG 57 06/06/2021 0935   HDL 82 06/06/2021 0935   CHOLHDL 3.2 06/06/2021 0935   CHOLHDL 2.7 09/22/2019 0825   VLDL 8 09/22/2019 0825   LDLCALC 172 (H) 06/06/2021 0935    Physical Exam:    VS:  There were no vitals taken for this visit. , BMI There is no height or weight on file to calculate BMI. GENERAL:  Well appearing HEENT: Pupils equal round and reactive, fundi not visualized, oral mucosa unremarkable NECK:  No jugular venous distention, waveform within normal limits, carotid upstroke brisk and symmetric, no bruits, no thyromegaly LYMPHATICS:  No cervical adenopathy LUNGS:  Clear to auscultation bilaterally HEART:  RRR.  PMI not displaced or sustained,S1 and S2 within normal limits, no S3, no S4, no clicks, no rubs, *** murmurs ABD:  Flat, positive bowel sounds normal in frequency in pitch, no bruits, no rebound, no guarding, no midline pulsatile mass, no hepatomegaly, no splenomegaly EXT:  2 plus pulses throughout, no edema, no cyanosis no clubbing SKIN:  No rashes no nodules NEURO:  Cranial nerves II through XII grossly intact, motor grossly intact throughout PSYCH:  Cognitively intact, oriented to person place and time   ASSESSMENT/PLAN:    No problem-specific Assessment & Plan notes found for this encounter.   Screening for Secondary Hypertension: { Click here to document screening for secondary causes of HTN  :[id number]}    Relevant Labs/Studies:    Latest Ref Rng & Units 12/05/2021   11:11 AM 06/06/2021    9:35 AM 11/26/2020    3:11 PM  Basic  Labs  Sodium 134 - 144 mmol/L 139  136  141   Potassium 3.5 - 5.2 mmol/L 4.6  4.2  3.8   Creatinine 0.57 - 1.00 mg/dL 1.01  1.04  0.94        Latest Ref Rng & Units 08/30/2019    8:50 AM 10/02/2015   12:04 PM  Thyroid   TSH 0.450 - 4.500 uIU/mL 2.880  1.46        Latest Ref Rng & Units 11/26/2015    4:55 PM  Renin/Aldosterone   Aldosterone ng/dL 3              11/01/2015    9:52 AM  Renovascular   Renal Artery [place] Completed Yes        she consents to be monitored in our remote patient monitoring program through [facility].  she will track his blood pressure twice daily and understands that these trends will help [place] to adjust her medications as needed prior to his next appointment.  she *** interested in enrolling in the PREP exercise and nutrition program through the [facility].     Disposition:   *** FU with APP/PharmD in 1 month for the next 3 months.   FU with Tiffany C. Oval Linsey, MD, [facility] in 4 months.   Medication Adjustments/Labs and Tests Ordered: Current medicines are reviewed at length with the patient today.  Concerns regarding medicines are outlined above.   No orders of the defined types were placed  in this encounter.  No orders of the defined types were placed in this encounter.   I,Mathew Stumpf,acting as a Education administrator for Skeet Latch, MD.,have documented all relevant documentation on the behalf of Skeet Latch, MD,as directed by  Skeet Latch, MD while in the presence of Skeet Latch, MD.  ***  Signed, Madelin Rear  06/24/2022 1:45 PM    [place]

## 2022-06-25 NOTE — Progress Notes (Signed)
Advanced Hypertension Clinic Initial Assessment:    Date:  06/26/2022   ID:  Theresa [name], DOB [birth year], MRN [id number]  PCP:  Ladell Pier, MD  Cardiologist:  None  Nephrologist:  Referring MD: Ladell Pier, MD   CC: Hypertension  History of Present Illness:    Theresa [name] is a 76 y.o. female with a hx of hypertension, hyperlipidemia, palpitations, ITP, anxiety, here to establish care in the Advanced Hypertension Clinic. She saw Dr. Wynetta Emery 11/2021 and her BP in the office was 170/90 on enalapril and metoprolol. She reported that her home blood pressures were controlled. Dr. Wynetta Emery increased her enalapril but she reduced it back due to palpitations. She was started on a clonidine patch and referred to the Advanced Hypertension Clinic.  Today, she complains of right shoulder pain and right-sided headaches. She believes her symptoms are attributable to adverse reactions from a recent vaccination. For the past 2 weeks she hasn't monitored her blood pressure at home. Prior to that she would notice a range of 240-973 systolic. She describes multiple life stressors at home. When she was on the clonidine patch she had developed palpitations, so it was stopped. Lately she is not participating in much formal exercise. However, she does sometimes go for a walk, or use her stationary bike at home for 30 minutes. She denies anginal or other exertional symptoms. Her breathing has been stable except for occasional orthopnea, and there are no recent issues with LE swelling. Typically she cooks her meals from home and monitors her sodium intake. She is using pink Himalayan salt. She often has rice. No alcohol consumption. For pain management, she will take tylenol. She denies any chest pain, shortness of breath, or peripheral edema. No lightheadedness, syncope, or PND.  Previous antihypertensives: Clonidine patch - palpitations Amlodipine (swelling) Candesartan  (palpitations) Carvedilol (excessive drowsiness, dizziness and palpitations) Chlorthalidone (palpitations, dizziness) PO clonidine (dizziness)  HCTZ (dizziness, palpitations)  Hydralazine (palpitations) lisinopril (palpitatons) Lisinopril-HCTZ (flushing and insomnia) Spironolactone (dizziness, palpitations) Valsartan (palpitations)   Past Medical History:  Diagnosis Date   Gastric ulcer    Hyperlipidemia    Hypertension    River blindness    Sinusitis     Past Surgical History:  Procedure Laterality Date   APPENDECTOMY     BREAST LUMPECTOMY     CATARACT EXTRACTION Left 01/2020    Current Medications: Current Meds  Medication Sig   acetaminophen (TYLENOL) 500 MG tablet Take 500 mg by mouth every 6 (six) hours as needed for mild pain or moderate pain.   ALPRAZolam (XANAX) 0.25 MG tablet Take 1 or 2 tablets by mouth about 30 minutes prior to procedures   bimatoprost (LUMIGAN) 0.01 % SOLN Instill 1 drop into both eyes every night   brimonidine-timolol (COMBIGAN) 0.2-0.5 % ophthalmic solution Instill 1 drop into both eyes twice a day   Cholecalciferol (VITAMIN D3 PO) Take by mouth.   enalapril (VASOTEC) 10 MG tablet Take 1 tablet (10 mg ) in the morning and 2 tablets ('20mg'$ ) in the evening.   folic acid (FOLVITE) 532 MCG tablet Take by mouth.   metoprolol tartrate (LOPRESSOR) 25 MG tablet TAKE 1 TABLET (25 MG TOTAL) BY MOUTH 2 (TWO) TIMES DAILY.   Multiple Vitamin (MULTIVITAMIN) tablet Take 1 tablet by mouth daily.   Multiple Vitamins-Minerals (ZINC PO) Take by mouth.   [DISCONTINUED] indapamide (LOZOL) 1.25 MG tablet Take 1 tablet (1.25 mg total) by mouth daily.     Allergies:   Amlodipine, Coreg [carvedilol],  Shellfish allergy, Trazodone and nefazodone, Hydralazine hcl, Augmentin [amoxicillin-pot clavulanate], Clonidine derivatives, Lisinopril, and Lisinopril-hydrochlorothiazide   Social History   Socioeconomic History   Marital status: Widowed    Spouse name: Not on  file   Number of children: 2   Years of education: Not on file   Highest education level: Not on file  Occupational History   Not on file  Tobacco Use   Smoking status: Never   Smokeless tobacco: Never  Vaping Use   Vaping Use: Never used  Substance and Sexual Activity   Alcohol use: No   Drug use: No   Sexual activity: Not Currently    Birth control/protection: Post-menopausal  Other Topics Concern   Not on file  Social History Narrative   Not on file   Social Determinants of Health   Financial Resource Strain: Not on file  Food Insecurity: Not on file  Transportation Needs: Not on file  Physical Activity: Not on file  Stress: Not on file  Social Connections: Not on file     Family History: The patient's family history includes Cancer in her brother; Hypertension in her mother.  ROS:   Please see the history of present illness.    (+) Right shoulder pain (+) Right sided headaches (+) Stress (+) Orthopnea All other systems reviewed and are negative.  EKGs/Labs/Other Studies Reviewed:    Bilateral Renal Artery Dopplers  11/01/2015: Summary:  Findings suggest 1-59% renal artery stenosis. There is evidence of  elevated resistive indices in bilateral intrarenal arteries.  Incidental finding: there is evidence of multiple simple cysts in  the left kidney, largest measuring 1.14cm.   EKG:  EKG is personally reviewed. 06/26/2022: Sinus rhythm. Rate 72 bpm. LAFB.  Recent Labs: 12/05/2021: BUN 11; Creatinine, Ser 1.01; Potassium 4.6; Sodium 139   Recent Lipid Panel    Component Value Date/Time   CHOL 263 (H) 06/06/2021 0935   TRIG 57 06/06/2021 0935   HDL 82 06/06/2021 0935   CHOLHDL 3.2 06/06/2021 0935   CHOLHDL 2.7 09/22/2019 0825   VLDL 8 09/22/2019 0825   LDLCALC 172 (H) 06/06/2021 0935    Physical Exam:    VS:  BP (!) 166/82 (BP Location: Left Arm, Patient Position: Sitting, Cuff Size: Normal)   Pulse 72   Ht '5\' 4"'$  (1.626 m)   Wt 177 lb (80.3 kg)    BMI 30.38 kg/m  , BMI Body mass index is 30.38 kg/m. GENERAL:  Well appearing HEENT: Pupils equal round and reactive, fundi not visualized, oral mucosa unremarkable NECK:  No jugular venous distention, waveform within normal limits, carotid upstroke brisk and symmetric, no bruits, no thyromegaly LUNGS:  Clear to auscultation bilaterally HEART:  RRR.  PMI not displaced or sustained,S1 and S2 within normal limits, no S3, no S4, no clicks, no rubs, no murmurs ABD:  Flat, positive bowel sounds normal in frequency in pitch, no bruits, no rebound, no guarding, no midline pulsatile mass, no hepatomegaly, no splenomegaly EXT:  2 plus pulses throughout, no edema, no cyanosis no clubbing SKIN:  No rashes no nodules NEURO:  Cranial nerves II through XII grossly intact, motor grossly intact throughout PSYCH:  Cognitively intact, oriented to person place and time   ASSESSMENT/PLAN:    White coat syndrome with diagnosis of hypertension Blood pressure is uncontrolled here and at home.  She does also have superimposed white coat hypertension.  She also struggles with intolerance to multiple medications.  We will try adding indapamide 1.25 mg daily.  Continue  enalapril and metoprolol.  She has a history of renal artery stenosis.  Repeat renal artery Dopplers to ensure that it has not progressed.  Encouraged her to continue exercising and get at least 150 minutes of exercise weekly.  Limit sodium, including pink salt.  She is unable to participate in the remote patient monitoring study due to concerns about her vision.    Hyperlipidemia Lipids are uncontrolled.  She has known renal artery stenosis and therefore her LDL should be <70.  She hasn't tolerated statins.  Given that she has multiple intolerances, we won't start any lipid therapies today.  Consider PCSK9 inhibitors in the future.   Screening for Secondary Hypertension:     06/26/2022    2:42 PM  Causes  Drugs/Herbals Screened     - Comments  limiting white salt but uses [place] salt, no EtOH  Renovascular HTN Screened     - Comments Mild renal artery steonsis.    Relevant Labs/Studies:    Latest Ref Rng & Units 12/05/2021   11:11 AM 06/06/2021    9:35 AM 11/26/2020    3:11 PM  Basic Labs  Sodium 134 - 144 mmol/L 139  136  141   Potassium 3.5 - 5.2 mmol/L 4.6  4.2  3.8   Creatinine 0.57 - 1.00 mg/dL 1.01  1.04  0.94        Latest Ref Rng & Units 08/30/2019    8:50 AM 10/02/2015   12:04 PM  Thyroid   TSH 0.450 - 4.500 uIU/mL 2.880  1.46        Latest Ref Rng & Units 11/26/2015    4:55 PM  Renin/Aldosterone   Aldosterone ng/dL 3              06/26/2022    3:11 PM  Renovascular   Renal Artery [place] Completed Yes   Disposition:    FU with APP/PharmD in 1 month for the next 3 months.   FU with Colston Pyle C. Oval Linsey, MD, [facility] in 4 months.  Medication Adjustments/Labs and Tests Ordered: Current medicines are reviewed at length with the patient today.  Concerns regarding medicines are outlined above.   Orders Placed This Encounter  Procedures   Basic metabolic panel   TSH   EKG 12-Lead   VAS US RENAL ARTERY DUPLEX   Meds ordered this encounter  Medications   DISCONTD: indapamide (LOZOL) 1.25 MG tablet    Sig: Take 1 tablet (1.25 mg total) by mouth daily.    Dispense:  30 tablet    Refill:  6   indapamide (LOZOL) 1.25 MG tablet    Sig: Take 1 tablet (1.25 mg total) by mouth daily.    Dispense:  30 tablet    Refill:  6   I,Mathew Stumpf,acting as a scribe for Skeet Latch, MD.,have documented all relevant documentation on the behalf of Skeet Latch, MD,as directed by  Skeet Latch, MD while in the presence of Skeet Latch, MD.  I, Kelliher Oval Linsey, MD have reviewed all documentation for this visit.  The documentation of the exam, diagnosis, procedures, and orders on 06/26/2022 are all accurate and complete.  Signed, Skeet Latch, MD  06/26/2022 4:49 PM    [place]

## 2022-06-26 ENCOUNTER — Telehealth: Payer: Self-pay | Admitting: Nurse Practitioner

## 2022-06-26 ENCOUNTER — Other Ambulatory Visit: Payer: Self-pay

## 2022-06-26 ENCOUNTER — Ambulatory Visit (HOSPITAL_BASED_OUTPATIENT_CLINIC_OR_DEPARTMENT_OTHER): Payer: Medicare Other | Admitting: Cardiovascular Disease

## 2022-06-26 ENCOUNTER — Other Ambulatory Visit: Payer: Self-pay | Admitting: Nurse Practitioner

## 2022-06-26 ENCOUNTER — Encounter (HOSPITAL_BASED_OUTPATIENT_CLINIC_OR_DEPARTMENT_OTHER): Payer: Self-pay | Admitting: Cardiovascular Disease

## 2022-06-26 ENCOUNTER — Ambulatory Visit (INDEPENDENT_AMBULATORY_CARE_PROVIDER_SITE_OTHER): Payer: Medicare Other | Admitting: Cardiovascular Disease

## 2022-06-26 VITALS — BP 166/82 | HR 72 | Ht 64.0 in | Wt 177.0 lb

## 2022-06-26 DIAGNOSIS — I701 Atherosclerosis of renal artery: Secondary | ICD-10-CM | POA: Diagnosis not present

## 2022-06-26 DIAGNOSIS — E782 Mixed hyperlipidemia: Secondary | ICD-10-CM | POA: Diagnosis not present

## 2022-06-26 DIAGNOSIS — I10 Essential (primary) hypertension: Secondary | ICD-10-CM | POA: Diagnosis not present

## 2022-06-26 MED ORDER — INDAPAMIDE 1.25 MG PO TABS
1.2500 mg | ORAL_TABLET | Freq: Every day | ORAL | 6 refills | Status: DC
  Filled 2022-06-26: qty 30, 30d supply, fill #0

## 2022-06-26 MED ORDER — INDAPAMIDE 1.25 MG PO TABS: 1.2500 mg | ORAL_TABLET | Freq: Every day | ORAL | 6 refills | Status: DC

## 2022-06-26 NOTE — Assessment & Plan Note (Addendum)
Lipids are uncontrolled.  She has known renal artery stenosis and therefore her LDL should be <70.  She hasn't tolerated statins.  Given that she has multiple intolerances, we won't start any lipid therapies today.  Consider PCSK9 inhibitors in the future.

## 2022-06-26 NOTE — Telephone Encounter (Signed)
   Pt called to report that indapamide rx, which was ordered earlier today, was sent to wrong pharmacy, and she'd like it sent [facility] [address].  Chart reviewed, indapamide 1.'25mg'$  daily ordered earlier today. I resent this same rx, #30, 6 refills, to [facility] on [address] @ pts request.  Caller verbalized understanding and was grateful for the call back.  Murray Hodgkins, NP 06/26/2022, 5:43 PM

## 2022-06-26 NOTE — Assessment & Plan Note (Signed)
Blood pressure is uncontrolled here and at home.  She does also have superimposed white coat hypertension.  She also struggles with intolerance to multiple medications.  We will try adding indapamide 1.25 mg daily.  Continue enalapril and metoprolol.  She has a history of renal artery stenosis.  Repeat renal artery Dopplers to ensure that it has not progressed.  Encouraged her to continue exercising and get at least 150 minutes of exercise weekly.  Limit sodium, including pink salt.  She is unable to participate in the remote patient monitoring study due to concerns about her vision.

## 2022-06-26 NOTE — Patient Instructions (Addendum)
Medication Instructions:  Your physician has recommended you make the following change in your medication:  Start: Indapamide 1.'25mg'$  daily    Labwork: Please return for Lab work in one week for BMP/TSH. You may come to the...   [facility] (3rd floor) [address], [place], [place] [address]  Open: 8am-Noon and 1pm-4:30pm  Please ring the doorbell on the small table when you exit the elevator and the Lab Tech will come get you  [place] [facility] [address] [place], [place], [place] [address] Open: 8am-1pm, then 2pm-4:30pm   [place]- Please see attached locations sheet stapled to your lab work with address and hours.   Testing/Procedures: Your physician has requested that you have a renal artery duplex. During this test, an ultrasound is used to evaluate blood flow to the kidneys. Allow one hour for this exam. Do not eat after midnight the day before and avoid carbonated beverages. Take your medications as you usually do.  Follow-Up: 1 month: 07/24/2022 10:30 am with Overton Mam NP    Prep: Dennis Bast will receive a phone call from the PREP exercise and nutrition program to schedule an initial assessment. Dash Diet eating plan

## 2022-06-27 ENCOUNTER — Other Ambulatory Visit: Payer: Self-pay

## 2022-06-29 ENCOUNTER — Other Ambulatory Visit: Payer: Self-pay | Admitting: Internal Medicine

## 2022-06-29 DIAGNOSIS — I10 Essential (primary) hypertension: Secondary | ICD-10-CM

## 2022-06-30 ENCOUNTER — Telehealth: Payer: Self-pay | Admitting: Cardiovascular Disease

## 2022-06-30 NOTE — Telephone Encounter (Signed)
Requested Prescriptions  Pending Prescriptions Disp Refills   metoprolol tartrate (LOPRESSOR) 25 MG tablet [Pharmacy Med Name: METOPROLOL TARTRATE 25 MG TAB] 180 tablet 1    Sig: TAKE 1 TABLET BY MOUTH TWICE A DAY     Cardiovascular:  Beta Blockers Failed - 06/30/2022  2:51 PM      Failed - Last BP in normal range    BP Readings from Last 1 Encounters:  06/26/22 (!) 166/82         Passed - Last Heart Rate in normal range    Pulse Readings from Last 1 Encounters:  06/26/22 72         Passed - Valid encounter within last 6 months    Recent Outpatient Visits           2 months ago Hypertension, uncontrolled   [place], Cygnet L, RPH-CPP   3 months ago Hypertension, uncontrolled   [place], Jarome Matin, RPH-CPP   6 months ago Hypertension, uncontrolled   [place], MD   1 year ago Hypertension, uncontrolled   [place], [place], [place]   2 years ago Hypertension, uncontrolled   [place], Deborah B, MD       Future Appointments             In 3 weeks Gilford Rile, Martie Lee, NP [facility], [facility]

## 2022-06-30 NOTE — Telephone Encounter (Signed)
  Pt c/o medication issue:  1. Name of Medication: Indapamide 1.'25mg'$  daily    2. How are you currently taking this medication (dosage and times per day)? As written  3. Are you having a reaction (difficulty breathing--STAT)? No   4. What is your medication issue? Pt requesting to speak with Dr. Blenda Mounts nurse regarding this medication

## 2022-06-30 NOTE — Telephone Encounter (Signed)
Pt called to report that she has about 2 or 3 tablets left. Please advise

## 2022-06-30 NOTE — Telephone Encounter (Signed)
Spoke with patient who stated she wanted to report drug reaction Stated started having palpitations about an hour after taking, roof of mouth "soft" and swelling in lips Advised to stop and will forward to Dr Oval Linsey for review

## 2022-07-01 ENCOUNTER — Other Ambulatory Visit: Payer: Self-pay | Admitting: Internal Medicine

## 2022-07-01 NOTE — Telephone Encounter (Signed)
Pt granddaughter stated medication enalapril (VASOTEC) was picked up yesterday and was supposed to pick up '10MG'$  however, the pharmacy gave pt '20MG'$  instead of '10MG'$ .  Pharmacy advised they filled a '20MG'$  because they were out of stock for '10MG'$ . Pt granddaughter stated pt needs '10MG'$  pt cannot break up the tablet; this just doesn't work for her. However, they are also concerned that the insurance won't pay for the '10MG'$ , and the medication cannot be taken back because they already took it home.  Pt has not taken her medication today.  Please advise.

## 2022-07-02 ENCOUNTER — Other Ambulatory Visit: Payer: Self-pay

## 2022-07-05 LAB — BASIC METABOLIC PANEL
BUN/Creatinine Ratio: 8 — ABNORMAL LOW (ref 12–28)
BUN: 8 mg/dL (ref 8–27)
CO2: 23 mmol/L (ref 20–29)
Calcium: 9.3 mg/dL (ref 8.7–10.3)
Chloride: 97 mmol/L (ref 96–106)
Creatinine, Ser: 0.95 mg/dL (ref 0.57–1.00)
Glucose: 94 mg/dL (ref 70–99)
Potassium: 4 mmol/L (ref 3.5–5.2)
Sodium: 135 mmol/L (ref 134–144)
eGFR: 61 mL/min/{1.73_m2} (ref >59)

## 2022-07-05 LAB — TSH: TSH: 3.72 u[IU]/mL (ref 0.450–4.500)

## 2022-07-23 ENCOUNTER — Ambulatory Visit (INDEPENDENT_AMBULATORY_CARE_PROVIDER_SITE_OTHER): Payer: Medicare Other

## 2022-07-23 DIAGNOSIS — I701 Atherosclerosis of renal artery: Secondary | ICD-10-CM

## 2022-07-23 DIAGNOSIS — I10 Essential (primary) hypertension: Secondary | ICD-10-CM | POA: Diagnosis not present

## 2022-07-24 ENCOUNTER — Encounter (HOSPITAL_BASED_OUTPATIENT_CLINIC_OR_DEPARTMENT_OTHER): Payer: Self-pay | Admitting: Family

## 2022-07-24 ENCOUNTER — Ambulatory Visit (INDEPENDENT_AMBULATORY_CARE_PROVIDER_SITE_OTHER): Payer: Medicare Other | Admitting: Family

## 2022-07-24 VITALS — BP 136/88 | HR 63 | Ht 64.0 in | Wt 175.7 lb

## 2022-07-24 DIAGNOSIS — I10 Essential (primary) hypertension: Secondary | ICD-10-CM | POA: Diagnosis not present

## 2022-07-24 NOTE — Progress Notes (Signed)
Advanced Hypertension Clinic Assessment:    Date:  07/24/2022   ID:  Theresa [name], DOB 06-09-1946, MRN [id number]  PCP:  Ladell Pier, MD  Cardiologist:  None  Nephrologist:  Referring MD: Ladell Pier, MD   CC: Hypertension  History of Present Illness:    Theresa [name] is a 76 y.o. female with a hx of hypertension, hyperlipidemia, palpitations, ITP, anxiety here to follow-up in the Advanced Hypertension Clinic.   Prior renal duplex 2017 with 1-59% right renal artery stenosis.  She established with advanced hypertension clinic 06/26/2022 after referral from primary care provider.  Multiple medication intolerances detailed below.  Noted palpitations and increased dose enalapril as well as clonidine.  Initial visit noted right shoulder pain and right-sided headaches which she attributed to recent vaccination. She was started on Indapamide for hypertension.   She presents today for follow up. 06/30/22 she contacted our office noting her tongue swelled up and her throat was sore which she attributed to Indapamide and discontinued. She notes increased urinary frequency for 3 days after one dose of Indapamide and that sore throat persists despite it being >3 weeks since her single dose. Describes as something "penetrating her flesh" in her throat but no difficulty swallowing. It is improving. We discussed this would be uncommon reaction. Notes she has a cough and a cold which she thinks she acquired from her grandchildren for which she has been taking vitamin C. No fever and symptoms improving.    Previous antihypertensives: Clonidine patch - palpitations Amlodipine (swelling) Candesartan (palpitations) Carvedilol (excessive drowsiness, dizziness and palpitations) Chlorthalidone (palpitations, dizziness) PO clonidine (dizziness)  HCTZ (dizziness, palpitations)  Hydralazine (palpitations) lisinopril (palpitatons) Lisinopril-HCTZ (flushing and insomnia) Spironolactone  (dizziness, palpitations) Valsartan (palpitations) Indapamide (tongue swelling, sore throat)  Past Medical History:  Diagnosis Date   Gastric ulcer    Hyperlipidemia    Hypertension    River blindness    Sinusitis     Past Surgical History:  Procedure Laterality Date   APPENDECTOMY     BREAST LUMPECTOMY     CATARACT EXTRACTION Left 01/2020    Current Medications: Current Meds  Medication Sig   acetaminophen (TYLENOL) 500 MG tablet Take 500 mg by mouth every 6 (six) hours as needed for mild pain or moderate pain.   ALPRAZolam (XANAX) 0.25 MG tablet Take 1 or 2 tablets by mouth about 30 minutes prior to procedures   bimatoprost (LUMIGAN) 0.01 % SOLN Instill 1 drop into both eyes every night   brimonidine-timolol (COMBIGAN) 0.2-0.5 % ophthalmic solution Instill 1 drop into both eyes twice a day   enalapril (VASOTEC) 10 MG tablet TAKE 1 TABLET (10 MG ) IN THE MORNING AND 2 TABLETS ('20MG'$ ) IN THE EVENING.   metoprolol tartrate (LOPRESSOR) 25 MG tablet TAKE 1 TABLET BY MOUTH TWICE A DAY     Allergies:   Amlodipine, Coreg [carvedilol], Shellfish allergy, Trazodone and nefazodone, Hydralazine hcl, Augmentin [amoxicillin-pot clavulanate], Indapamide, Clonidine derivatives, Lisinopril, and Lisinopril-hydrochlorothiazide   Social History   Socioeconomic History   Marital status: Widowed    Spouse name: Not on file   Number of children: 2   Years of education: Not on file   Highest education level: Not on file  Occupational History   Not on file  Tobacco Use   Smoking status: Never   Smokeless tobacco: Never  Vaping Use   Vaping Use: Never used  Substance and Sexual Activity   Alcohol use: No   Drug use: No  Sexual activity: Not Currently    Birth control/protection: Post-menopausal  Other Topics Concern   Not on file  Social History Narrative   Not on file   Social Determinants of Health   Financial Resource Strain: Not on file  Food Insecurity: Not on file   Transportation Needs: Not on file  Physical Activity: Not on file  Stress: Not on file  Social Connections: Not on file     Family History: The patient's family history includes Cancer in her brother; Hypertension in her mother.  ROS:   Please see the history of present illness.     All other systems reviewed and are negative.  EKGs/Labs/Other Studies Reviewed:    EKG:  EKG is not ordered today.    Bilateral Renal Artery Dopplers  11/01/2015: Summary:  Findings suggest 1-59% renal artery stenosis. There is evidence of  elevated resistive indices in bilateral intrarenal arteries.  Incidental finding: there is evidence of multiple simple cysts in  the left kidney, largest measuring 1.14cm.   Recent Labs: 07/04/2022: BUN 8; Creatinine, Ser 0.95; Potassium 4.0; Sodium 135; TSH 3.720   Recent Lipid Panel    Component Value Date/Time   CHOL 263 (H) 06/06/2021 0935   TRIG 57 06/06/2021 0935   HDL 82 06/06/2021 0935   CHOLHDL 3.2 06/06/2021 0935   CHOLHDL 2.7 09/22/2019 0825   VLDL 8 09/22/2019 0825   LDLCALC 172 (H) 06/06/2021 0935    Physical Exam:   VS:  BP 136/88   Pulse 63   Ht '5\' 4"'$  (1.626 m)   Wt 175 lb 11.2 oz (79.7 kg)   SpO2 97%   BMI 30.16 kg/m  , BMI Body mass index is 30.16 kg/m. GENERAL:  Well appearing HEENT: Pupils equal round and reactive, fundi not visualized, oral mucosa unremarkable NECK:  No jugular venous distention, waveform within normal limits, carotid upstroke brisk and symmetric, no bruits, no thyromegaly LYMPHATICS:  No cervical adenopathy LUNGS:  Clear to auscultation bilaterally HEART:  RRR.  PMI not displaced or sustained,S1 and S2 within normal limits, no S3, no S4, no clicks, no rubs, no murmurs ABD:  Flat, positive bowel sounds normal in frequency in pitch, no bruits, no rebound, no guarding, no midline pulsatile mass, no hepatomegaly, no splenomegaly EXT:  2 plus pulses throughout, no edema, no cyanosis no clubbing SKIN:  No rashes no  nodules NEURO:  Cranial nerves II through XII grossly intact, motor grossly intact throughout PSYCH:  Cognitively intact, oriented to person place and time   ASSESSMENT/PLAN:    HTN - Initial BP 142/72 with repeat 136/88 without intervention. She has not been checking at home. Control limited by intolerance to 13 different antihypertensives. She took single dose of Indapamide which she noted swollen tongue and sore throat. Sore throat improved but persistent 3 weeks later and discussed not likely related to Indapamide. Throat with no swelling, exudate, redness on exam. Continue Enalapril, Metoprolol at present doses. She will monitor BP at home and we will check in via phone call in 2 weeks. If BP not at goal, consider Doxazosin.   Screening for Secondary Hypertension:     06/26/2022    2:42 PM  Causes  Drugs/Herbals Screened     - Comments limiting white salt but uses [place] salt, no EtOH  Renovascular HTN Screened     - Comments Mild renal artery steonsis.    Relevant Labs/Studies:    Latest Ref Rng & Units 07/04/2022    8:10 AM 12/05/2021  11:11 AM 06/06/2021    9:35 AM  Basic Labs  Sodium 134 - 144 mmol/L 135  139  136   Potassium 3.5 - 5.2 mmol/L 4.0  4.6  4.2   Creatinine 0.57 - 1.00 mg/dL 0.95  1.01  1.04        Latest Ref Rng & Units 07/04/2022    8:10 AM 08/30/2019    8:50 AM  Thyroid   TSH 0.450 - 4.500 uIU/mL 3.720  2.880        Latest Ref Rng & Units 11/26/2015    4:55 PM  Renin/Aldosterone   Aldosterone ng/dL 3              07/23/2022   12:22 PM  Renovascular   Renal Artery [place] Completed Yes     Disposition:    FU with MD/PharmD in 3-4 months    Medication Adjustments/Labs and Tests Ordered: Current medicines are reviewed at length with the patient today.  Concerns regarding medicines are outlined above.  No orders of the defined types were placed in this encounter.  No orders of the defined types were placed in this  encounter.    Signed, Loel Dubonnet, NP  07/24/2022 9:10 PM    [facility]

## 2022-07-24 NOTE — Patient Instructions (Signed)
Medication Instructions:  Your Physician recommend you continue on your current medication as directed.    Follow-Up: 3-4 MONTHS ADV HTN CLINIC WITH DR. Boerne OR Laurann Montana, NP   Special Instructions:  PLEASE CHECK YOUR BLOOD PRESSURE 1X PER DAY 2 HOURS AFTER MEDICATION AND KEEP A LOG AND Jaramiah Bossard WILL CALL YOU IN ABOUT 2 WEEKS TO FOLLOW UP

## 2022-07-26 NOTE — Telephone Encounter (Signed)
Addressed in clinic visit.   Loel Dubonnet, NP

## 2022-07-30 ENCOUNTER — Telehealth (HOSPITAL_BASED_OUTPATIENT_CLINIC_OR_DEPARTMENT_OTHER): Payer: Self-pay | Admitting: Cardiovascular Disease

## 2022-07-30 NOTE — Telephone Encounter (Signed)
Patient would like a call back to discuss test results.

## 2022-07-30 NOTE — Telephone Encounter (Signed)
Advised patient nothing different on renal duplex based on final result, same as Urban Gibson NP discussed at visit

## 2022-07-30 NOTE — Telephone Encounter (Signed)
Left message to call back  

## 2022-07-30 NOTE — Telephone Encounter (Signed)
Pt is returning call. Requesting call back.  

## 2022-08-04 ENCOUNTER — Other Ambulatory Visit: Payer: Self-pay

## 2022-08-04 ENCOUNTER — Telehealth (HOSPITAL_BASED_OUTPATIENT_CLINIC_OR_DEPARTMENT_OTHER): Payer: Self-pay

## 2022-08-04 MED ORDER — DOXAZOSIN MESYLATE 2 MG PO TABS
2.0000 mg | ORAL_TABLET | Freq: Every day | ORAL | 6 refills | Status: DC
  Filled 2022-08-04: qty 30, 30d supply, fill #0

## 2022-08-04 NOTE — Telephone Encounter (Signed)
Returned call to patient and provided the following recommendations, verified pharmacy with patient. Patient verbalizes understanding.    "BP not at goal of less than 130/80.  Recommend addition doxazosin 2 mg every evening.   Loel Dubonnet, NP"

## 2022-08-04 NOTE — Addendum Note (Signed)
Addended by: Gerald Stabs on: 08/04/2022 01:37 PM   Modules accepted: Orders

## 2022-08-04 NOTE — Telephone Encounter (Addendum)
Called patient to get blood pressure readings. Patient states she is checking them but she is not ready to give the readings yet, advised patient to call us back when she is ready.    ----- Message from Gerald Stabs, RN sent at 07/24/2022 11:37 AM EST ----- cALL FOR BP CHECK FOR [facility]-PT DOES NOT USE [facility]

## 2022-08-04 NOTE — Telephone Encounter (Signed)
Patient calling back with her bp reading  12/28: 136/88 12/29: 165/97 12/30: 147/82 12/31: 146/94 1/1: 167/96 1/2: 158/84 1/3: 160/70 1/4: 168/94 1/5: 172/94 1/6: 168/91 1/8; 168/91

## 2022-08-04 NOTE — Telephone Encounter (Signed)
BP not at goal of less than 130/80.  Recommend addition doxazosin 2 mg every evening.  Loel Dubonnet, NP

## 2022-08-04 NOTE — Telephone Encounter (Signed)
BP log as requested 

## 2022-08-05 ENCOUNTER — Other Ambulatory Visit: Payer: Self-pay

## 2022-08-05 ENCOUNTER — Telehealth: Payer: Self-pay | Admitting: Cardiovascular Disease

## 2022-08-05 MED ORDER — DOXAZOSIN MESYLATE 2 MG PO TABS: 2.0000 mg | ORAL_TABLET | Freq: Every day | ORAL | 6 refills | Status: DC

## 2022-08-05 NOTE — Telephone Encounter (Signed)
Rx request sent to pharmacy.  

## 2022-08-05 NOTE — Telephone Encounter (Signed)
*  STAT* If patient is at the pharmacy, call can be transferred to refill team.   1. Which medications need to be refilled? (please list name of each medication and dose if known) doxazosin (CARDURA) 2 MG tablet    2. Which pharmacy/location (including street and city if local pharmacy) is medication to be sent to?  [id number] [place] [facility] [address]   3. Do they need a 30 day or 90 day supply? 30   Pt [facility] of community [facility]

## 2022-08-06 ENCOUNTER — Other Ambulatory Visit: Payer: Self-pay

## 2022-08-08 ENCOUNTER — Ambulatory Visit: Payer: Self-pay

## 2022-08-08 NOTE — Telephone Encounter (Signed)
Called & spoke to patient. Verified name & DOB. Advised patient of message below. Patient expressed verbal understanding and will be getting the shingles vaccine.

## 2022-08-08 NOTE — Telephone Encounter (Signed)
  Chief Complaint: Shingles vaccine question Symptoms:  Frequency:  Pertinent Negatives: Patient denies  Disposition: '[]'$ ED /'[]'$ Urgent Care (no appt availability in office) / '[]'$ Appointment(In office/virtual)/ '[]'$  [facility]/ '[]'$ Home Care/ '[]'$ Refused Recommended Disposition /'[]'$ [facility]/ '[x]'$  Follow-up with PCP Additional Notes: PT would like to know if she soul get a shingles vaccine. Pt had shingles in 2010.   Please return pt's call with advice.     Summary: shingles vaccine advice   Pt has had shingles before and she would like to know if it is ok to get shingles vaccine / please advise     Reason for Disposition  [1] Caller requesting NON-URGENT health information AND [2] PCP's office is the best resource  Answer Assessment - Initial Assessment Questions 1. REASON FOR CALL or QUESTION: "What is your reason for calling today?" or "How can I best help you?" or "What question do you have that I can help answer?"     Should pt get a shingles vaccine?  Protocols used: Information Only Call - No Triage-A-AH

## 2022-08-11 ENCOUNTER — Telehealth: Payer: Self-pay | Admitting: Cardiovascular Disease

## 2022-08-11 NOTE — Telephone Encounter (Signed)
Patient is requesting to talk with Dr. Oval Linsey or nurse about medication

## 2022-08-11 NOTE — Telephone Encounter (Signed)
Call transferred from call center,   Patient states she has concerns about her doxazosin. Advised patient to take her doxazosin in the evening and that she can take it with her metoprolol. Advised patient on symptoms of too low blood pressure.   Patient verbalizes understanding.

## 2022-08-18 ENCOUNTER — Encounter (HOSPITAL_BASED_OUTPATIENT_CLINIC_OR_DEPARTMENT_OTHER): Payer: Self-pay

## 2022-08-25 ENCOUNTER — Telehealth: Payer: Self-pay | Admitting: Cardiovascular Disease

## 2022-08-25 NOTE — Telephone Encounter (Signed)
Patient started doxasozin on 1/8, please advise

## 2022-08-25 NOTE — Telephone Encounter (Signed)
Returned call to patient and provided the following recommendations, patient verbalizes understanding and will hold x 1 week. Rn set reminder to check on patient in one week!    "She previously had sore/raw throat  after taking single dose of indapamide. She has had perceived intolerance to multiple medications. Previously recommended to follow up PCP regarding this issue.   Low suspicion Doxazosin is contributory but can stop and check back in a week to see if symptoms improved and reassess BP.    For palpitations recommend staying well hydrated, avoiding caffeine, and using deep breathing exercises. Her heart rate appears well controlled at home.   Loel Dubonnet, NP"

## 2022-08-25 NOTE — Telephone Encounter (Signed)
She previously had sore/raw throat  after taking single dose of indapamide. She has had perceived intolerance to multiple medications. Previously recommended to follow up PCP regarding this issue.  Low suspicion Doxazosin is contributory but can stop and check back in a week to see if symptoms improved and reassess BP.   For palpitations recommend staying well hydrated, avoiding caffeine, and using deep breathing exercises. Her heart rate appears well controlled at home.  Loel Dubonnet, NP

## 2022-08-25 NOTE — Telephone Encounter (Signed)
  Patient c/o Palpitations:  High priority if patient c/o lightheadedness, shortness of breath, or chest pain  How long have you had palpitations/irregular HR/ Afib? Are you having the symptoms now? Since she woke up this morning   Are you currently experiencing lightheadedness, SOB or CP? No   Do you have a history of afib (atrial fibrillation) or irregular heart rhythm? no  Have you checked your BP or HR? (document readings if available):  1/12: 146/76 ( the day she started taking digoxin) 1/13: 132/68 hr 60 1/14: 145/72 hr 69 1/15: 142/84 hr 53 1/16: 144/86 hr 54 1/17: 147/84 hr 58 1/18: 159/82 hr 56 1/19: 129/73 hr 59 1/20: 138/79 hr 73 1/21: 173/63 ? 1/22: 142/76 hr 73 1/23: 122/87 hr52  1/24: 135/75 hr 59 1/25: 148/87 hr 52 1/26: 137/75 hr 55 1/27: 145/78 hr (forgot to take down)/ evening 149/32 1/28: 135/91 hr55/ evening 147/75 hr 54 1/29: 162/83 hr 66               5) Are you experiencing any other symptoms? She states ever since she started taking her digoxsin she has had these bumps swelling in her mouth that hurt and it feels acidic. She also stating that she noticed her heart racing this morning.

## 2022-09-01 ENCOUNTER — Encounter (HOSPITAL_BASED_OUTPATIENT_CLINIC_OR_DEPARTMENT_OTHER): Payer: Self-pay

## 2022-09-02 ENCOUNTER — Telehealth (HOSPITAL_BASED_OUTPATIENT_CLINIC_OR_DEPARTMENT_OTHER): Payer: Self-pay | Admitting: Cardiovascular Disease

## 2022-09-02 NOTE — Telephone Encounter (Signed)
Did she resume Doxazosin? We had asked her to hold and to report back her BP in one week.   Would recommend OV in HTN Clinic with either Dr. Cherlynn Polo, [facility], or myself to discuss next steps for BP management. Remain off Doxazosin.   Loel Dubonnet, NP

## 2022-09-02 NOTE — Telephone Encounter (Signed)
Pt c/o medication issue:  1. Name of Medication: doxazosin (CARDURA) 2 MG tablet   2. How are you currently taking  this medication (dosage and times per day)?    3. Are you having a reaction (difficulty breathing--STAT)? no  4. What is your medication issue? Stats stats that she has had allergic reaction to the medication. Swelling in her mouth

## 2022-09-02 NOTE — Telephone Encounter (Signed)
Returned call to patient, she has not resumed the doxazosin, but endorses that her symptoms are better since stopping the medication. She does want to take something else to get her BP back down. She is unable to come in for an office visit at this time due to a lack of transportation. Patient scheduled for a telephone visit with Dr. Oval Linsey for follow up and medication adjustment.     "Did she resume Doxazosin? We had asked her to hold and to report back her BP in one week.    Would recommend OV in HTN Clinic with either Dr. Cherlynn Polo, [facility], or myself to discuss next steps for BP management. Remain off Doxazosin.    Loel Dubonnet, NP"

## 2022-09-02 NOTE — Telephone Encounter (Signed)
Pt. Calling back in for reaction to medication, she was previously told to hold to see if symptoms resolve. D/c meds and replace with new med or bring back in for office visit?

## 2022-09-10 ENCOUNTER — Encounter (HOSPITAL_BASED_OUTPATIENT_CLINIC_OR_DEPARTMENT_OTHER): Payer: Self-pay | Admitting: Cardiovascular Disease

## 2022-09-10 ENCOUNTER — Telehealth (INDEPENDENT_AMBULATORY_CARE_PROVIDER_SITE_OTHER): Payer: Medicare Other | Admitting: Cardiovascular Disease

## 2022-09-10 VITALS — BP 156/96 | HR 57 | Ht 64.0 in | Wt 175.0 lb

## 2022-09-10 DIAGNOSIS — I10 Essential (primary) hypertension: Secondary | ICD-10-CM

## 2022-09-10 NOTE — Progress Notes (Signed)
Virtual Visit via Telephone Note   Because of TYLYN ZWAHLEN co-morbid illnesses, she is at least at moderate risk for complications without adequate follow up.  This format is felt to be most appropriate for this patient at this time.  The patient did not have access to video technology/had technical difficulties with video requiring transitioning to audio format only (telephone).  All issues noted in this document were discussed and addressed.  No physical exam could be performed with this format.  Please refer to the patient's chart for her consent to telehealth for [facility].   The patient was identified using 2 identifiers.  Date:  09/10/2022   ID:  Theresa [name], DOB 12-02-1945, MRN [id number]  Patient Location: Home Provider Location: Office/Clinic  PCP:  Ladell Pier, MD  Cardiologist:  None  Electrophysiologist:  None   Evaluation Performed:  Follow-Up Visit  Chief Complaint:  Hypertension.   History of Present Illness:    The patient does not have symptoms concerning for COVID-19 infection (fever, chills, cough, or new shortness of breath).   Theresa [name] is a 77 y.o. female with a hx of hypertension, hyperlipidemia, palpitations, ITP, anxiety, here to establish care in the [facility]. She saw Dr. Wynetta Emery 11/2021 and her BP in the office was 170/90 on enalapril and metoprolol. She reported that her home blood pressures were controlled. Dr. Wynetta Emery increased her enalapril but she reduced it back due to palpitations. She was started on a clonidine patch and referred to the [facility].  At her last appointment her BP was uncontrolled both at home ([id number] systolic) and in the office (166/82), with superimposed white coat hypertension. She has struggled with intolerances to multiple medications. We added 1.25 mg indapamide daily. Renal artery dopplers were repeated 06/2022 showing 1-59% stenosis of the right renal  artery, and no evidence of stenosis in the left. On 06/30/2022 she reported swelling of her tongue and sore throat which she attributed to indapamide so it was discontinued. Her blood pressures remained above goal 07/2022 so 2 mg doxazosin was added. She was concerned that doxazosin was causing palpitations and mouth swelling. She was advised to try holding doxazosin for 1 week. Her symptoms improved so she did not resume doxazosin.  Today, she is feeling much better. At home blood pressures have been averaging in the 150s-160s. Of note, recently she has struggled with multiple life stressors. She confirms that she was unable to tolerate doxazosin: she also noticed that the back of her mouth and throat was bumpy. Lately she is not exercising as much, but she does try. Soon she plans to travel and visit her home country. She denies any palpitations, chest pain, shortness of breath, or peripheral edema. No lightheadedness, headaches, syncope, orthopnea, or PND.  Previous antihypertensives: Clonidine patch (palpitations) Amlodipine (swelling) Candesartan (palpitations) Carvedilol (excessive drowsiness, dizziness and palpitations) Chlorthalidone (palpitations, dizziness) PO clonidine (dizziness)  HCTZ (dizziness, palpitations)  Hydralazine (palpitations) lisinopril (palpitatons) Lisinopril-HCTZ (flushing and insomnia) Spironolactone (dizziness, palpitations) Valsartan (palpitations) Indapamide (sore throat, tongue swelling) Doxazosin (palpitations, mouth lesions/swelling)  Past Medical History:  Diagnosis Date   Gastric ulcer    Hyperlipidemia    Hypertension    River blindness    Sinusitis     Past Surgical History:  Procedure Laterality Date   APPENDECTOMY     BREAST LUMPECTOMY     CATARACT EXTRACTION Left 01/2020    Current Medications: Current Meds  Medication Sig   acetaminophen (TYLENOL) 500  MG tablet Take 500 mg by mouth every 6 (six) hours as needed for mild pain or moderate  pain.   bimatoprost (LUMIGAN) 0.01 % SOLN Instill 1 drop into both eyes every night   brimonidine-timolol (COMBIGAN) 0.2-0.5 % ophthalmic solution Instill 1 drop into both eyes twice a day   Cholecalciferol (VITAMIN D3 PO) Take by mouth.   enalapril (VASOTEC) 10 MG tablet TAKE 1 TABLET (10 MG ) IN THE MORNING AND 2 TABLETS ('20MG'$ ) IN THE EVENING.   folic acid (FOLVITE) A999333 MCG tablet Take by mouth.   metoprolol tartrate (LOPRESSOR) 25 MG tablet TAKE 1 TABLET BY MOUTH TWICE A DAY   Multiple Vitamin (MULTIVITAMIN) tablet Take 1 tablet by mouth daily.   Multiple Vitamins-Minerals (ZINC PO) Take by mouth.     Allergies:   Amlodipine, Coreg [carvedilol], Shellfish allergy, Trazodone and nefazodone, Hydralazine hcl, Augmentin [amoxicillin-pot clavulanate], Indapamide, Clonidine derivatives, Lisinopril, and Lisinopril-hydrochlorothiazide   Social History   Socioeconomic History   Marital status: Widowed    Spouse name: Not on file   Number of children: 2   Years of education: Not on file   Highest education level: Not on file  Occupational History   Not on file  Tobacco Use   Smoking status: Never   Smokeless tobacco: Never  Vaping Use   Vaping Use: Never used  Substance and Sexual Activity   Alcohol use: No   Drug use: No   Sexual activity: Not Currently    Birth control/protection: Post-menopausal  Other Topics Concern   Not on file  Social History Narrative   Not on file   Social Determinants of Health   Financial Resource Strain: Not on file  Food Insecurity: Not on file  Transportation Needs: Not on file  Physical Activity: Not on file  Stress: Not on file  Social Connections: Not on file     Family History: The patient's family history includes Cancer in her brother; Hypertension in her mother.  ROS:   Please see the history of present illness.    (+) Stress All other systems reviewed and are negative.  EKGs/Labs/Other Studies Reviewed:    Bilateral Renal  Artery Dopplers  07/23/2022: Summary:  Renal:    Right: Normal size right kidney. RRV flow present. 1-59% stenosis of         the right renal artery. Normal cortical thickness of right         kidney. Abnormal right Resistive Index.  Left:  Normal size of left kidney. LRV flow present. No evidence of         left renal artery stenosis. Normal cortical thickness of the         left kidney. Abnormal left Resisitve Index.   Bilateral Renal Artery Dopplers  11/01/2015: Summary:  Findings suggest 1-59% renal artery stenosis. There is evidence of  elevated resistive indices in bilateral intrarenal arteries.  Incidental finding: there is evidence of multiple simple cysts in  the left kidney, largest measuring 1.14cm.   EKG:  EKG is personally reviewed. 09/10/2022:  EKG was not ordered. 06/26/2022: Sinus rhythm. Rate 72 bpm. LAFB.  Recent Labs: 07/04/2022: BUN 8; Creatinine, Ser 0.95; Potassium 4.0; Sodium 135; TSH 3.720   Recent Lipid Panel    Component Value Date/Time   CHOL 263 (H) 06/06/2021 0935   TRIG 57 06/06/2021 0935   HDL 82 06/06/2021 0935   CHOLHDL 3.2 06/06/2021 0935   CHOLHDL 2.7 09/22/2019 0825   VLDL 8 09/22/2019 0825   LDLCALC  172 (H) 06/06/2021 0935    Physical Exam:    BP (!) 156/96   Pulse (!) 57   Ht '5\' 4"'$  (1.626 m)   Wt 175 lb (79.4 kg)   BMI 30.04 kg/m  GENERAL: Well-appearing.  No acute distress. HEENT: Pupils equal round.  Oral mucosa unremarkable NECK:  No jugular venous distention, no visible thyromegaly EXT:  No edema, no cyanosis no clubbing SKIN:  No rashes no nodules NEURO:  Speech fluent.  Cranial nerves grossly intact.  Moves all 4 extremities freely PSYCH:  Cognitively intact, oriented to person place and time   ASSESSMENT/PLAN:    White coat syndrome with diagnosis of hypertension Blood pressure remains uncontrolled.  She did not tolerate indapamide or doxazosin.  We do not have many other medication options.  Suggest renal denervation.   Continue metoprolol and enalapril. She is also struggling with stress.  We will have our care guide reach out to her for stress management.  Shared Decision Making/Informed Consent The risks [stroke (1 in 1000), death (1 in 1000), kidney failure [usually temporary] (1 in 500), bleeding (1 in 200), allergic reaction [possibly serious] (1 in 200)], benefits (diagnostic support and management of coronary artery disease) and alternatives of a renal denervation were discussed in detail with Theresa [name] and she is willing to proceed.    Screening for Secondary Hypertension:     06/26/2022    2:42 PM  Causes  Drugs/Herbals Screened     - Comments limiting white salt but uses [place] salt, no EtOH  Renovascular HTN Screened     - Comments Mild renal artery steonsis.    Relevant Labs/Studies:    Latest Ref Rng & Units 07/04/2022    8:10 AM 12/05/2021   11:11 AM 06/06/2021    9:35 AM  Basic Labs  Sodium 134 - 144 mmol/L 135  139  136   Potassium 3.5 - 5.2 mmol/L 4.0  4.6  4.2   Creatinine 0.57 - 1.00 mg/dL 0.95  1.01  1.04        Latest Ref Rng & Units 07/04/2022    8:10 AM 08/30/2019    8:50 AM  Thyroid   TSH 0.450 - 4.500 uIU/mL 3.720  2.880        Latest Ref Rng & Units 11/26/2015    4:55 PM  Renin/Aldosterone   Aldosterone ng/dL 3              07/23/2022   12:22 PM  Renovascular   Renal Artery [place] Completed Yes    COVID-19 Education: The signs and symptoms of COVID-19 were discussed with the patient and how to seek care for testing (follow up with PCP or arrange E-visit).  The importance of social distancing was discussed today.  Time:   Today, I have spent 30 minutes with the patient with telehealth technology discussing the above problems.    Disposition:    FU with APP/PharmD in 1 month for the next 3 months.   FU with Antonios Ostrow C. Oval Linsey, MD, [facility] in 4 months.  Medication Adjustments/Labs and Tests Ordered: Current medicines are reviewed at length with the patient  today.  Concerns regarding medicines are outlined above.   No orders of the defined types were placed in this encounter.  No orders of the defined types were placed in this encounter.  I,Mathew Stumpf,acting as a Education administrator for Skeet Latch, MD.,have documented all relevant documentation on the behalf of Skeet Latch, MD,as directed by  Skeet Latch, MD while  in the presence of Skeet Latch, MD.  I, [place] Oval Linsey, MD have reviewed all documentation for this visit.  The documentation of the exam, diagnosis, procedures, and orders on 09/10/2022 are all accurate and complete.  Signed, Skeet Latch, MD  09/10/2022 4:42 PM    [place]

## 2022-09-10 NOTE — Assessment & Plan Note (Addendum)
Blood pressure remains uncontrolled.  She did not tolerate indapamide or doxazosin.  We do not have many other medication options.  Suggest renal denervation.  Continue metoprolol and enalapril. She is also struggling with stress.  We will have our care guide reach out to her for stress management.  Shared Decision Making/Informed Consent The risks [stroke (1 in 1000), death (1 in 1000), kidney failure [usually temporary] (1 in 500), bleeding (1 in 200), allergic reaction [possibly serious] (1 in 200)], benefits (diagnostic support and management of coronary artery disease) and alternatives of a renal denervation were discussed in detail with Theresa [name] and she is willing to proceed.

## 2022-09-10 NOTE — Patient Instructions (Addendum)
Medication Instructions:  Your physician recommends that you continue on your current medications as directed. Please refer to the Current Medication list given to you today.   Labwork: BMET/CBC 1 WEEK PRIOR TO PROCEDURE  Testing/Procedures: RENAL DENERVATION IS PROCEDURE DISCUSSED  WILL CALL  YOU ONCE INSURANCE HAS APPROVED   Follow-Up: 11/20/2022 11:00 AM WITH DR [facility]

## 2022-09-11 ENCOUNTER — Telehealth (HOSPITAL_BASED_OUTPATIENT_CLINIC_OR_DEPARTMENT_OTHER): Payer: Self-pay | Admitting: Cardiovascular Disease

## 2022-09-11 ENCOUNTER — Ambulatory Visit: Payer: Medicare Other | Attending: Internal Medicine

## 2022-09-11 VITALS — Ht 64.0 in | Wt 175.0 lb

## 2022-09-11 DIAGNOSIS — Z0000 Encounter for general adult medical examination without abnormal findings: Secondary | ICD-10-CM

## 2022-09-11 NOTE — Telephone Encounter (Signed)
Renal denervation?

## 2022-09-11 NOTE — Telephone Encounter (Signed)
Left message to call back  

## 2022-09-11 NOTE — Progress Notes (Signed)
I connected with  Theresa [name] on 09/11/22 by a audio enabled telemedicine application and verified that I am speaking with the correct person using two identifiers.  Patient Location: Home  Provider Location: Office/Clinic  I discussed the limitations of evaluation and management by telemedicine. The patient expressed understanding and agreed to proceed.  Subjective:   Theresa [name] is a 77 y.o. female who presents for an Initial Medicare Annual Wellness Visit.  Review of Systems     Cardiac Risk Factors include: advanced age (>46mn, >>25women);dyslipidemia;obesity (BMI >30kg/m2)     Objective:    Today's Vitals   09/11/22 1101 09/11/22 1102  Weight: 175 lb (79.4 kg)   Height: 5' 4"$  (1.626 m)   PainSc:  5    Body mass index is 30.04 kg/m.     09/11/2022   11:10 AM 01/15/2017    2:42 PM 11/24/2016    8:47 AM 11/03/2016    8:26 AM 10/01/2016    9:08 AM 06/09/2016    9:18 AM 10/02/2015   11:08 AM  Advanced Directives  Does Patient Have a Medical Advance Directive? No No No No No No No  Would patient like information on creating a medical advance directive?   No - Patient declined No - Patient declined       Current Medications (verified) Outpatient Encounter Medications as of 09/11/2022  Medication Sig   acetaminophen (TYLENOL) 500 MG tablet Take 500 mg by mouth every 6 (six) hours as needed for mild pain or moderate pain.   ALPRAZolam (XANAX) 0.25 MG tablet Take 1 or 2 tablets by mouth about 30 minutes prior to procedures   bimatoprost (LUMIGAN) 0.01 % SOLN Instill 1 drop into both eyes every night   brimonidine-timolol (COMBIGAN) 0.2-0.5 % ophthalmic solution Instill 1 drop into both eyes twice a day   Cholecalciferol (VITAMIN D3 PO) Take by mouth.   enalapril (VASOTEC) 10 MG tablet TAKE 1 TABLET (10 MG ) IN THE MORNING AND 2 TABLETS (20MG) IN THE EVENING.   folic acid (FOLVITE) 4A999333MCG tablet Take by mouth.   metoprolol tartrate (LOPRESSOR) 25 MG tablet TAKE 1 TABLET  BY MOUTH TWICE A DAY   Multiple Vitamin (MULTIVITAMIN) tablet Take 1 tablet by mouth daily.   Multiple Vitamins-Minerals (ZINC PO) Take by mouth.   No facility-administered encounter medications on file as of 09/11/2022.    Allergies (verified) Amlodipine, Coreg [carvedilol], Shellfish allergy, Trazodone and nefazodone, Hydralazine hcl, Augmentin [amoxicillin-pot clavulanate], Cardura [doxazosin], Indapamide, Clonidine derivatives, Lisinopril, and Lisinopril-hydrochlorothiazide   History: Past Medical History:  Diagnosis Date   Gastric ulcer    Hyperlipidemia    Hypertension    River blindness    Sinusitis    Past Surgical History:  Procedure Laterality Date   APPENDECTOMY     BREAST LUMPECTOMY     CATARACT EXTRACTION Left 01/2020   Family History  Problem Relation Age of Onset   Hypertension Mother    Cancer Brother    Social History   Socioeconomic History   Marital status: Widowed    Spouse name: Not on file   Number of children: 2   Years of education: Not on file   Highest education level: Not on file  Occupational History   Not on file  Tobacco Use   Smoking status: Never   Smokeless tobacco: Never  Vaping Use   Vaping Use: Never used  Substance and Sexual Activity   Alcohol use: No   Drug use: No  Sexual activity: Not Currently    Birth control/protection: Post-menopausal  Other Topics Concern   Not on file  Social History Narrative   Not on file   Social Determinants of Health   Financial Resource Strain: Low Risk  (09/11/2022)   Overall Financial Resource Strain (CARDIA)    Difficulty of Paying Living Expenses: Not hard at all  Food Insecurity: No Food Insecurity (09/11/2022)   Hunger Vital Sign    Worried About Running Out of Food in the Last Year: Never true    Ran Out of Food in the Last Year: Never true  Transportation Needs: No Transportation Needs (09/11/2022)   PRAPARE - Hydrologist (Medical): No    Lack of  Transportation (Non-Medical): No  Physical Activity: Inactive (09/11/2022)   Exercise Vital Sign    Days of Exercise per Week: 0 days    Minutes of Exercise per Session: 0 min  Stress: No Stress Concern Present (09/11/2022)   [place]    Feeling of Stress : Not at all  Social Connections: Not on file    Tobacco Counseling Counseling given: Not Answered   Clinical Intake:  Pre-visit preparation completed: Yes  Pain : 0-10 Pain Score: 5  Pain Type: Chronic pain Pain Location: Head Pain Descriptors / Indicators: Aching Pain Onset: More than a month ago Pain Frequency: Intermittent     Nutritional Status: BMI > 30  Obese Nutritional Risks: None Diabetes: No  How often do you need to have someone help you when you read instructions, pamphlets, or other written materials from your doctor or pharmacy?: 1 - Never  Diabetic? no  Interpreter Needed?: No  Information entered by :: NAllen LPN   Activities of Daily Living    09/11/2022   11:11 AM  In your present state of health, do you have any difficulty performing the following activities:  Hearing? 0  Vision? 0  Difficulty concentrating or making decisions? 0  Walking or climbing stairs? 0  Dressing or bathing? 0  Doing errands, shopping? 0  Preparing Food and eating ? N  Using the Toilet? N  In the past six months, have you accidently leaked urine? N  Do you have problems with loss of bowel control? N  Managing your Medications? N  Managing your Finances? N  Housekeeping or managing your Housekeeping? N    Patient Care Team: Ladell Pier, MD as PCP - General (Internal Medicine)  Indicate any recent Medical Services you may have received from other than [facility] providers in the past year (date may be approximate).     Assessment:   This is a routine wellness examination for Theresa [name].  Hearing/Vision screen Vision Screening - Comments::  Regular eye exams, [facility]  Dietary issues and exercise activities discussed: Current Exercise Habits: The patient does not participate in regular exercise at present   Goals Addressed             This Visit's Progress    Patient Stated       09/11/2022, wants to exercise more       Depression Screen    09/11/2022   11:11 AM 12/05/2021   11:42 AM 11/23/2019    9:02 AM 08/25/2019    3:44 PM 04/21/2019    3:22 PM 10/11/2018   11:13 AM 08/18/2018   10:48 AM  PHQ 2/9 Scores  PHQ - 2 Score 0 0 0 0 0 0 0  PHQ- 9 Score  3 0 1 1 6     $ Fall Risk    09/11/2022   11:11 AM 12/05/2021   11:41 AM 02/24/2020    8:36 AM 11/23/2019    9:02 AM 10/11/2018   11:13 AM  Fall Risk   Falls in the past year? 0 0 0 0 0  Number falls in past yr: 0 0 0 0   Injury with Fall? 0  0 0   Risk for fall due to : Medication side effect No Fall Risks     Follow up Falls prevention discussed;Education provided;Falls evaluation completed Falls evaluation completed  Falls evaluation completed     FALL RISK PREVENTION PERTAINING TO THE HOME:  Any stairs in or around the home? Yes  If so, are there any without handrails? No  Home free of loose throw rugs in walkways, pet beds, electrical cords, etc? Yes  Adequate lighting in your home to reduce risk of falls? Yes   ASSISTIVE DEVICES UTILIZED TO PREVENT FALLS:  Life alert? Yes  Use of a cane, walker or w/c? No  Grab bars in the bathroom? No  Shower chair or bench in shower? No  Elevated toilet seat or a handicapped toilet? No   TIMED UP AND GO:  Was the test performed? No .       Cognitive Function:        09/11/2022   11:13 AM  6CIT Screen  What Year? 0 points  What month? 0 points  What time? 0 points  Count back from 20 0 points  Months in reverse 0 points  Repeat phrase 2 points  Total Score 2 points    Immunizations Immunization History  Administered Date(s) Administered   Influenza,inj,Quad PF,6+ Mos 04/21/2019   PFIZER  Comirnaty(Gray Top)Covid-19 Tri-Sucrose Vaccine 10/31/2020   PFIZER(Purple Top)SARS-COV-2 Vaccination 10/24/2019, 11/16/2019   Zoster Recombinat (Shingrix) 08/08/2022    TDAP status: Due, Education has been provided regarding the importance of this vaccine. Advised may receive this vaccine at local pharmacy or Health Dept. Aware to provide a copy of the vaccination record if obtained from local pharmacy or Health Dept. Verbalized acceptance and understanding.  Flu Vaccine status: Due, Education has been provided regarding the importance of this vaccine. Advised may receive this vaccine at local pharmacy or Health Dept. Aware to provide a copy of the vaccination record if obtained from local pharmacy or Health Dept. Verbalized acceptance and understanding.  Pneumococcal vaccine status: Up to date  Covid-19 vaccine status: Completed vaccines  Qualifies for Shingles Vaccine? Yes   Zostavax completed No   Shingrix Completed?: needs sec dose  Screening Tests Health Maintenance  Topic Date Due   Medicare Annual Wellness (AWV)  Never done   Diabetic kidney evaluation - Urine ACR  Never done   Hepatitis C Screening  Never done   DTaP/Tdap/Td (1 - Tdap) Never done   INFLUENZA VACCINE  02/25/2022   COVID-19 Vaccine (4 - 2023-24 season) 03/28/2022   Pneumonia Vaccine 51+ Years old (1 of 1 - PCV) 12/06/2022 (Originally 06/17/2009)   DEXA SCAN  12/06/2022 (Originally 06/17/2009)   Zoster Vaccines- Shingrix (2 of 2) 10/03/2022   Diabetic kidney evaluation - eGFR measurement  07/05/2023   HPV VACCINES  Aged Out   FOOT EXAM  Discontinued   HEMOGLOBIN A1C  Discontinued   OPHTHALMOLOGY EXAM  Discontinued    Health Maintenance  Health Maintenance Due  Topic Date Due   Medicare Annual Wellness (AWV)  Never done  Diabetic kidney evaluation - Urine ACR  Never done   Hepatitis C Screening  Never done   DTaP/Tdap/Td (1 - Tdap) Never done   INFLUENZA VACCINE  02/25/2022   COVID-19 Vaccine (4 -  2023-24 season) 03/28/2022    Colorectal cancer screening: No longer required.   Mammogram status: Completed 06/17/2021. Repeat every 2 years  Bone Density status: decline  Lung Cancer Screening: (Low Dose CT Chest recommended if Age 31-80 years, 30 pack-year currently smoking OR have quit w/in 15years.) does not qualify.   Lung Cancer Screening Referral: no  Additional Screening:  Hepatitis C Screening: does qualify;   Vision Screening: Recommended annual ophthalmology exams for early detection of glaucoma and other disorders of the eye. Is the patient up to date with their annual eye exam?  Yes  Who is the provider or what is the name of the office in which the patient attends annual eye exams? [facility] If pt is not established with a provider, would they like to be referred to a provider to establish care? No .   Dental Screening: Recommended annual dental exams for proper oral hygiene  Community Resource Referral / Chronic Care Management: CRR required this visit?  No   CCM required this visit?  No      Plan:     I have personally reviewed and noted the following in the patient's chart:   Medical and social history Use of alcohol, tobacco or illicit drugs  Current medications and supplements including opioid prescriptions. Patient is not currently taking opioid prescriptions. Functional ability and status Nutritional status Physical activity Advanced directives List of other physicians Hospitalizations, surgeries, and ER visits in previous 12 months Vitals Screenings to include cognitive, depression, and falls Referrals and appointments  In addition, I have reviewed and discussed with patient certain preventive protocols, quality metrics, and best practice recommendations. A written personalized care plan for preventive services as well as general preventive health recommendations were provided to patient.     Kellie Simmering, LPN   [phone]   Nurse  Notes: none  Due to this being a virtual visit, the after visit summary with patients personalized plan was offered to patient via mail or my-chart. Patient would like to access on my-chart

## 2022-09-11 NOTE — Telephone Encounter (Signed)
Patient states that she is calling in bout procedure she is suppose to have done. Please advise

## 2022-09-11 NOTE — Patient Instructions (Signed)
Theresa [name] , Thank you for taking time to come for your Medicare Wellness Visit. I appreciate your ongoing commitment to your health goals. Please review the following plan we discussed and let me know if I can assist you in the future.   These are the goals we discussed:  Goals      Patient Stated     09/11/2022, wants to exercise more        This is a list of the screening recommended for you and due dates:  Health Maintenance  Topic Date Due   Yearly kidney health urinalysis for diabetes  Never done   Hepatitis C Screening: USPSTF Recommendation to screen - Ages 66-79 yo.  Never done   DTaP/Tdap/Td vaccine (1 - Tdap) Never done   Flu Shot  02/25/2022   COVID-19 Vaccine (4 - 2023-24 season) 03/28/2022   Pneumonia Vaccine (1 of 1 - PCV) 12/06/2022*   DEXA scan (bone density measurement)  12/06/2022*   Zoster (Shingles) Vaccine (2 of 2) 10/03/2022   Yearly kidney function blood test for diabetes  07/05/2023   Medicare Annual Wellness Visit  09/12/2023   HPV Vaccine  Aged Out   Complete foot exam   Discontinued   Hemoglobin A1C  Discontinued   Eye exam for diabetics  Discontinued  *Topic was postponed. The date shown is not the original due date.    Advanced directives: Advance directive discussed with you today.   Conditions/risks identified: none  Next appointment: Follow up in one year for your annual wellness visit    Preventive Care 65 Years and Older, Female Preventive care refers to lifestyle choices and visits with your health care provider that can promote health and wellness. What does preventive care include? A yearly physical exam. This is also called an annual well check. Dental exams once or twice a year. Routine eye exams. Ask your health care provider how often you should have your eyes checked. Personal lifestyle choices, including: Daily care of your teeth and gums. Regular physical activity. Eating a healthy diet. Avoiding tobacco and drug  use. Limiting alcohol use. Practicing safe sex. Taking low-dose aspirin every day. Taking vitamin and mineral supplements as recommended by your health care provider. What happens during an annual well check? The services and screenings done by your health care provider during your annual well check will depend on your age, overall health, lifestyle risk factors, and family history of disease. Counseling  Your health care provider may ask you questions about your: Alcohol use. Tobacco use. Drug use. Emotional well-being. Home and relationship well-being. Sexual activity. Eating habits. History of falls. Memory and ability to understand (cognition). Work and work Statistician. Reproductive health. Screening  You may have the following tests or measurements: Height, weight, and BMI. Blood pressure. Lipid and cholesterol levels. These may be checked every 5 years, or more frequently if you are over 49 years old. Skin check. Lung cancer screening. You may have this screening every year starting at age 37 if you have a 30-pack-year history of smoking and currently smoke or have quit within the past 15 years. Fecal occult blood test (FOBT) of the stool. You may have this test every year starting at age 86. Flexible sigmoidoscopy or colonoscopy. You may have a sigmoidoscopy every 5 years or a colonoscopy every 10 years starting at age 25. Hepatitis C blood test. Hepatitis B blood test. Sexually transmitted disease (STD) testing. Diabetes screening. This is done by checking your blood sugar (glucose) after you  have not eaten for a while (fasting). You may have this done every 1-3 years. Bone density scan. This is done to screen for osteoporosis. You may have this done starting at age 52. Mammogram. This may be done every 1-2 years. Talk to your health care provider about how often you should have regular mammograms. Talk with your health care provider about your test results, treatment  options, and if necessary, the need for more tests. Vaccines  Your health care provider may recommend certain vaccines, such as: Influenza vaccine. This is recommended every year. Tetanus, diphtheria, and acellular pertussis (Tdap, Td) vaccine. You may need a Td booster every 10 years. Zoster vaccine. You may need this after age 15. Pneumococcal 13-valent conjugate (PCV13) vaccine. One dose is recommended after age 82. Pneumococcal polysaccharide (PPSV23) vaccine. One dose is recommended after age 80. Talk to your health care provider about which screenings and vaccines you need and how often you need them. This information is not intended to replace advice given to you by your health care provider. Make sure you discuss any questions you have with your health care provider. Document Released: 08/10/2015 Document Revised: 04/02/2016 Document Reviewed: 05/15/2015 Elsevier Interactive Patient Education  2017 [place] Prevention in the Home Falls can cause injuries. They can happen to people of all ages. There are many things you can do to make your home safe and to help prevent falls. What can I do on the outside of my home? Regularly fix the edges of walkways and driveways and fix any cracks. Remove anything that might make you trip as you walk through a door, such as a raised step or threshold. Trim any bushes or trees on the path to your home. Use bright outdoor lighting. Clear any walking paths of anything that might make someone trip, such as rocks or tools. Regularly check to see if handrails are loose or broken. Make sure that both sides of any steps have handrails. Any raised decks and porches should have guardrails on the edges. Have any leaves, snow, or ice cleared regularly. Use sand or salt on walking paths during winter. Clean up any spills in your garage right away. This includes oil or grease spills. What can I do in the bathroom? Use night lights. Install grab  bars by the toilet and in the tub and shower. Do not use towel bars as grab bars. Use non-skid mats or decals in the tub or shower. If you need to sit down in the shower, use a plastic, non-slip stool. Keep the floor dry. Clean up any water that spills on the floor as soon as it happens. Remove soap buildup in the tub or shower regularly. Attach bath mats securely with double-sided non-slip rug tape. Do not have throw rugs and other things on the floor that can make you trip. What can I do in the bedroom? Use night lights. Make sure that you have a light by your bed that is easy to reach. Do not use any sheets or blankets that are too big for your bed. They should not hang down onto the floor. Have a firm chair that has side arms. You can use this for support while you get dressed. Do not have throw rugs and other things on the floor that can make you trip. What can I do in the kitchen? Clean up any spills right away. Avoid walking on wet floors. Keep items that you use a lot in easy-to-reach places. If you need  to reach something above you, use a strong step stool that has a grab bar. Keep electrical cords out of the way. Do not use floor polish or wax that makes floors slippery. If you must use wax, use non-skid floor wax. Do not have throw rugs and other things on the floor that can make you trip. What can I do with my stairs? Do not leave any items on the stairs. Make sure that there are handrails on both sides of the stairs and use them. Fix handrails that are broken or loose. Make sure that handrails are as long as the stairways. Check any carpeting to make sure that it is firmly attached to the stairs. Fix any carpet that is loose or worn. Avoid having throw rugs at the top or bottom of the stairs. If you do have throw rugs, attach them to the floor with carpet tape. Make sure that you have a light switch at the top of the stairs and the bottom of the stairs. If you do not have them,  ask someone to add them for you. What else can I do to help prevent falls? Wear shoes that: Do not have high heels. Have rubber bottoms. Are comfortable and fit you well. Are closed at the toe. Do not wear sandals. If you use a stepladder: Make sure that it is fully opened. Do not climb a closed stepladder. Make sure that both sides of the stepladder are locked into place. Ask someone to hold it for you, if possible. Clearly mark and make sure that you can see: Any grab bars or handrails. First and last steps. Where the edge of each step is. Use tools that help you move around (mobility aids) if they are needed. These include: Canes. Walkers. Scooters. Crutches. Turn on the lights when you go into a dark area. Replace any light bulbs as soon as they burn out. Set up your furniture so you have a clear path. Avoid moving your furniture around. If any of your floors are uneven, fix them. If there are any pets around you, be aware of where they are. Review your medicines with your doctor. Some medicines can make you feel dizzy. This can increase your chance of falling. Ask your doctor what other things that you can do to help prevent falls. This information is not intended to replace advice given to you by your health care provider. Make sure you discuss any questions you have with your health care provider. Document Released: 05/10/2009 Document Revised: 12/20/2015 Document Reviewed: 08/18/2014 Elsevier Interactive Patient Education  2017 [employer].

## 2022-09-12 NOTE — Telephone Encounter (Signed)
Follow Up:      Patient is returning a call from yesterday. 

## 2022-09-12 NOTE — Telephone Encounter (Signed)
Pt. Following up with you

## 2022-09-12 NOTE — Telephone Encounter (Signed)
New Message:    Patient said Dr Oval Linsey told her to keep track of her weight.  Her weight yesterday was 172.8 y

## 2022-09-16 ENCOUNTER — Encounter (HOSPITAL_BASED_OUTPATIENT_CLINIC_OR_DEPARTMENT_OTHER): Payer: Self-pay | Admitting: Cardiovascular Disease

## 2022-09-16 NOTE — Telephone Encounter (Signed)
Patient is calling stating she has decided she would like to go through with the procedure discussed with Dr. Oval Linsey. She is wanting to know if she would have to pay a copay to have the procedure performed & is also requesting it be arranged prior to her leaving for travel in May. Please advise.

## 2022-09-16 NOTE — Telephone Encounter (Signed)
Joneen Roach N at 09/16/2022 11:27 AM  Status: Signed  Patient is calling stating she has decided she would like to go through with the procedure discussed with Dr. Oval Linsey. She is wanting to know if she would have to pay a copay to have the procedure performed & is also requesting it be arranged prior to her leaving for travel in May. Please advise.      Will be traveling in May and would like renal denervation done prior to leaving  Message sent to precert department   Will forward to Dr Oval Linsey so she will be aware

## 2022-09-16 NOTE — Telephone Encounter (Signed)
Renal denervation! Sorry not sure what next steps are

## 2022-09-16 NOTE — Telephone Encounter (Signed)
This encounter was created in error - please disregard.

## 2022-09-25 ENCOUNTER — Other Ambulatory Visit: Payer: Self-pay | Admitting: Internal Medicine

## 2022-09-25 DIAGNOSIS — I10 Essential (primary) hypertension: Secondary | ICD-10-CM

## 2022-09-25 NOTE — Telephone Encounter (Signed)
Medication Refill - Medication:   Metoprolol  25 mg  Enalapril 10 mg   Has the patient contacted their pharmacy? Yes.   (Agent: If no, request that the patient contact the pharmacy for the refill. If patient does not wish to contact the pharmacy document the reason why and proceed with request.) (Agent: If yes, when and what did the pharmacy advise?)  Preferred Pharmacy (with phone number [facility] street name): [facility] [id number] Has the patient been seen for an appointment in the last year OR does the patient have an upcoming appointment? Yes.    Agent: Please be advised that RX refills may take up to 3 business days. We ask that you follow-up with your pharmacy.

## 2022-09-26 ENCOUNTER — Other Ambulatory Visit: Payer: Self-pay

## 2022-09-26 MED ORDER — ENALAPRIL MALEATE 10 MG PO TABS
ORAL_TABLET | ORAL | 1 refills | Status: DC
  Filled 2022-09-26: qty 270, 90d supply, fill #0

## 2022-09-26 MED ORDER — METOPROLOL TARTRATE 25 MG PO TABS
25.0000 mg | ORAL_TABLET | Freq: Two times a day (BID) | ORAL | 1 refills | Status: DC
  Filled 2022-09-26: qty 180, 90d supply, fill #0

## 2022-10-01 ENCOUNTER — Telehealth (HOSPITAL_BASED_OUTPATIENT_CLINIC_OR_DEPARTMENT_OTHER): Payer: Self-pay | Admitting: Cardiovascular Disease

## 2022-10-01 NOTE — Telephone Encounter (Signed)
Spoke with Estill Bamberg to follow up on status of prior authorization  She is working on Production designer, theatre/television/film information to get

## 2022-10-01 NOTE — Telephone Encounter (Signed)
Patient calling to get cpt code for the procedure so that she can give it to her insurance. States she wants to know how much she have to pay. Please advise

## 2022-10-01 NOTE — Telephone Encounter (Signed)
H&V Care Navigation CSW Progress Note  Clinical Social Worker  consulted by Rip Harbour, Therapist, sports, regarding pt insurance and issues with prior authorization . Pt does not have Medicare Part A per card or chart. She has Part B only and Medicaid. There could be several reasons why she does not have Part A including work hx or residency requirements. Ramond Craver she should contact [place] if she does think she is Medicare Part A eligible, or has further Medicare benefit questions. I also contacted Shanon Rosser, financial counselor with [facility] regarding pt account to see if any additional insight.   Patient is participating in a Managed Medicaid Plan:  No, Medicare Part B and Medicaid  [place]: No Food Insecurity (09/11/2022)  Transportation Needs: No Transportation Needs (09/11/2022)  Depression (PHQ2-9): Low Risk  (09/11/2022)  Financial Resource Strain: Low Risk  (09/11/2022)  Physical Activity: Inactive (09/11/2022)  Stress: No Stress Concern Present (09/11/2022)  Tobacco Use: Low Risk  (09/11/2022)   Westley Hummer, MSW, LCSW Clinical Social Worker [place]  [phone]- work cell phone (preferred) [phone]- desk phone

## 2022-10-01 NOTE — Telephone Encounter (Signed)
Spoke with patient to give CPT code Per patient she spoke with Medicaid they are the ones needing CPT code. Also stated was told she would not need to pay anything I again explained to patient that I do not do anything with billing or insurance and was unsure if there would be cost to her  Advised patient would send to precert department/billing so they would be aware

## 2022-10-02 NOTE — Telephone Encounter (Signed)
Patient called stating she gave her insurance the CPT code, she states the procedure is going to be too expensive for her. She said she was told her plan does not cover that.

## 2022-10-02 NOTE — Telephone Encounter (Signed)
As requested

## 2022-10-02 NOTE — Telephone Encounter (Signed)
Spoke with patient who stated that she spoke with her insurance and renal denervation not covered  Stated she can not afford procedure at this time  Insurance advised that she reach out to her Education officer, museum, she will do that She wanted Dr Oval Linsey to be aware

## 2022-10-02 NOTE — Telephone Encounter (Signed)
Pt would like a callback from the nurse regarding CPT code. Please advise.

## 2022-10-03 NOTE — Telephone Encounter (Signed)
Patient would like a call back to give update on procedure coverage and schedule.

## 2022-10-03 NOTE — Telephone Encounter (Signed)
Returned the call to the patient and the niece. They stated that they called her insurance and she has been cleared for surgery. She stated that the code will go into effect on Monday and the procedure can be scheduled 30 days after.

## 2022-10-09 ENCOUNTER — Telehealth: Payer: Self-pay | Admitting: Cardiovascular Disease

## 2022-10-09 NOTE — Telephone Encounter (Signed)
Patient is calling to speak to Dr. Oval Linsey or nurse. Please call back

## 2022-10-09 NOTE — Telephone Encounter (Signed)
Spoke with patient who was following up on call from 3/6   Returned the call to the patient and the niece. They stated that they called her insurance and she has been cleared for surgery. She stated that the code will go into effect on Monday and the procedure can be scheduled 30 days after  Advised patient would reach out to [place] again regarding precert and call her back

## 2022-10-15 ENCOUNTER — Ambulatory Visit (HOSPITAL_BASED_OUTPATIENT_CLINIC_OR_DEPARTMENT_OTHER): Payer: Medicare Other | Admitting: Cardiovascular Disease

## 2022-10-16 NOTE — Telephone Encounter (Signed)
Patient is calling to speak to Dr. Hancock or nurse. Please call back 

## 2022-10-16 NOTE — Telephone Encounter (Signed)
Spoke with patient and she has questions regarding prior auth  Will forward to [place] [facility] for follow up

## 2022-10-30 NOTE — Telephone Encounter (Signed)
October 17, 2022 Ciro Backer  to Me  [facility]   10/17/22  9:22 AM I called Ms. Chow this morning, I let her know that I am checking the code with her insurance every other day to see if it has been updated from "non-covered" to "covered". She explained to me her travel plans and when it would be better for her to have the renal denervation.  She just wanted to touch base to make sure we were working to get her the relief that she needs. I assured her that we are working on it and her health and well-being is our top priority. She was thankful for the call.

## 2022-11-11 ENCOUNTER — Telehealth: Payer: Self-pay | Admitting: Cardiovascular Disease

## 2022-11-11 NOTE — Telephone Encounter (Signed)
Pt. Returning your call.

## 2022-11-11 NOTE — Telephone Encounter (Signed)
Pt calling back for an update  

## 2022-11-11 NOTE — Telephone Encounter (Signed)
Caller stated she is returning Amanda's call.

## 2022-11-13 NOTE — Telephone Encounter (Signed)
Patient is requesting a call back to get update on prior auth for future procedure.

## 2022-11-13 NOTE — Telephone Encounter (Signed)
Please advise 

## 2022-11-14 ENCOUNTER — Telehealth (HOSPITAL_BASED_OUTPATIENT_CLINIC_OR_DEPARTMENT_OTHER): Payer: Self-pay | Admitting: Cardiovascular Disease

## 2022-11-14 DIAGNOSIS — I10 Essential (primary) hypertension: Secondary | ICD-10-CM

## 2022-11-14 DIAGNOSIS — Z01812 Encounter for preprocedural laboratory examination: Secondary | ICD-10-CM

## 2022-11-14 NOTE — Telephone Encounter (Signed)
The patient called in with a few questions. All questions have been answered.

## 2022-11-14 NOTE — Telephone Encounter (Signed)
Patient is calling back with further questions. Requesting return call.

## 2022-11-14 NOTE — Telephone Encounter (Signed)
Theresa [name], please follow up with this patient. We do not know how to advise her as we do not know where she is at in the prior auth procoess for her renal denervation.

## 2022-11-14 NOTE — Telephone Encounter (Signed)
Patient has been made aware of instructions. They have also been sent to MyChart.    [place], Beecher Mcardle; Marlene Lard, RN; Burnell Blanks, [place] minutes ago (11:32 AM)   Spoke with Ms. Asper again this morning. She's good to schedule her procedure. I let her know someone will be in touch soon to get her on the schedule. She said " As soon as possible, please."  Thanks,  Beecher Mcardle are scheduled for a Renal Denervation on Wednesday, April 24 with Dr. Lorine Bears.  1. Please arrive at the [facility] (Main Entrance A) at [facility]: [address] [place], [place] [address] at 6:30 AM (This time is two hours before your procedure to ensure your preparation). Free valet parking service is available. You will check in at ADMITTING. The support person will be asked to wait in the waiting room.  It is OK to have someone drop you off and come back when you are ready to be discharged.    Special note: Every effort is made to have your procedure done on time. Please understand that emergencies sometimes delay scheduled procedures.  2. Diet: Do not eat solid foods after midnight.  The patient may have clear liquids until 5am upon the day of the procedure.  3. Labs: You will need to have blood drawn by 4/22. You do not need to be fasting. You do not need an appointment.  - [address] (Third floor-[facility])   4. Medication instructions in preparation for your procedure: Nothing to hold   On the morning of your procedure, take your Aspirin 81 mg and any morning medicines NOT listed above.  You may use sips of water.  5. Plan to go home the same day, you will only stay overnight if medically necessary. 6. Bring a current list of your medications and current insurance cards. 7. You MUST have a responsible person to drive you home. 8. Someone MUST be with you the first 24 hours after you arrive home or your discharge will be delayed. 9. Please  wear clothes that are easy to get on and off and wear slip-on shoes.  Thank you for allowing [place] to care for you!   -- [facility]

## 2022-11-14 NOTE — Telephone Encounter (Signed)
Patient is calling back to discuss upcoming appt she has with 04/25 with Dr. Duke Salvia. She is requesting to know if this appt should be moved out further since she has procedure scheduled on 04/24. Please advise.

## 2022-11-14 NOTE — Telephone Encounter (Signed)
Patient called to follow-up on getting an ablation.  Patient stated she will be going out of town and wants to finalize her procedure before she travels.

## 2022-11-17 ENCOUNTER — Telehealth: Payer: Self-pay | Admitting: Cardiovascular Disease

## 2022-11-17 NOTE — Telephone Encounter (Signed)
Consulted with Dr. Duke Salvia this morning, patient's are generally seen 1-2 weeks post by Dr. Kirke Corin and then one month for BP  check with either Dr. Duke Salvia or Gillian Shields, NP. Will call patient back today and move patient appointment out.

## 2022-11-17 NOTE — Telephone Encounter (Signed)
Returned call to patient, rescheduled her for month post renal dev. With Dr. Duke Salvia. She will check with her niece and granddaughter to arrange her transportation and let us know if that date doesn't work for her. Reviewed renal denervation instructions with patient as she had not read mychart, she says it is hard due to her poor eyesight. She will have labs done tomorrow.

## 2022-11-17 NOTE — Telephone Encounter (Signed)
Patient stated she was taking ASA 81 mg daily  Advised no need to hold prior to procedure

## 2022-11-17 NOTE — Telephone Encounter (Signed)
Patient called talking about her BP medication. English isn't very good and hard to understand. Patient did say that she wanted Dr. Duke Salvia or nurse to give a call back

## 2022-11-18 ENCOUNTER — Telehealth: Payer: Self-pay | Admitting: Internal Medicine

## 2022-11-18 ENCOUNTER — Telehealth: Payer: Self-pay | Admitting: *Deleted

## 2022-11-18 ENCOUNTER — Other Ambulatory Visit: Payer: Self-pay | Admitting: Internal Medicine

## 2022-11-18 LAB — BASIC METABOLIC PANEL
BUN/Creatinine Ratio: 13 (ref 12–28)
BUN: 15 mg/dL (ref 8–27)
CO2: 30 mmol/L — ABNORMAL HIGH (ref 20–29)
Calcium: 9.4 mg/dL (ref 8.7–10.3)
Chloride: 105 mmol/L (ref 96–106)
Creatinine, Ser: 1.14 mg/dL — ABNORMAL HIGH (ref 0.57–1.00)
Glucose: 87 mg/dL (ref 70–99)
Potassium: 4.7 mmol/L (ref 3.5–5.2)
Sodium: 142 mmol/L (ref 134–144)
eGFR: 49 mL/min/{1.73_m2} — ABNORMAL LOW (ref >59)

## 2022-11-18 LAB — CBC
Hematocrit: 37.7 % (ref 34.0–46.6)
Hemoglobin: 12.7 g/dL (ref 11.1–15.9)
MCH: 27.7 pg (ref 26.6–33.0)
MCHC: 33.7 g/dL (ref 31.5–35.7)
MCV: 82 fL (ref 79–97)
Platelets: 81 10*3/uL — CL (ref 150–450)
RBC: 4.59 x10E6/uL (ref 3.77–5.28)
RDW: 14.6 % (ref 11.7–15.4)
WBC: 5.9 10*3/uL (ref 3.4–10.8)

## 2022-11-18 NOTE — Telephone Encounter (Addendum)
11/18/22 GFR 49-per Dr Brett Canales Lab protocol-hold enalapril this evening and tomorrow morning-pt had been notified.

## 2022-11-18 NOTE — Telephone Encounter (Signed)
Renal Denervation scheduled at [name] for: Wednesday November 19, 2022 8:30 AM Arrival time [facility] Main Entrance A at: 6:30 AM  Nothing to eat after midnight prior to procedure, clear liquids until 5 AM day of procedure  Medication instructions: -Usual morning medications can be taken with sips of water including aspirin 81 mg.  Confirmed patient has responsible adult to drive home post procedure and be with patient first 24 hours after arriving home.  Plan to go home the same day, you will only stay overnight if medically necessary.  Reviewed procedure instructions with patient.

## 2022-11-18 NOTE — Telephone Encounter (Signed)
Routing to PCP. I'm not sure if the procedure team is managing pre-procedure medications.

## 2022-11-18 NOTE — Telephone Encounter (Signed)
Pt called for an update on the medication request needed before her procedure tomorrow. Cb# [phone]

## 2022-11-18 NOTE — Telephone Encounter (Signed)
Medication Refill - Medication: ALPRAZolam (XANAX) 0.25 MG tablet  Has the patient contacted their pharmacy? Yes.    Pharmacy advised pt to reach out to PCP. Per pt she needs medication today for her procedure tomorrow. Pt states that she has to take the mediation by 5am the latest.   Preferred Pharmacy (with phone number or street name): [id number] [place], [place] - [address]  Phone: [phone] Fax: [phone]  Has the patient been seen for an appointment in the last year OR does the patient have an upcoming appointment? Yes.    Agent: Please be advised that RX refills may take up to 3 business days. We ask that you follow-up with your pharmacy.

## 2022-11-18 NOTE — Telephone Encounter (Signed)
Requested medication (s) are due for refill today - no  Requested medication (s) are on the active medication list -no  Future visit scheduled -no  Last refill: unknown  Notes to clinic: medication no longer on current medication list, non delegated Rx  Requested Prescriptions  Pending Prescriptions Disp Refills   ALPRAZolam (XANAX) 0.25 MG tablet [Pharmacy Med Name: ALPRAZOLAM 0.25 MG TABLET] 2 tablet     Sig: TAKE 1 TO 2 TABLETS BY MOUTH APPROXIMATELY 30 MINUTES PRIOR TO PROCEDURES     Not Delegated - Psychiatry: Anxiolytics/Hypnotics 2 Failed - 11/18/2022 10:39 AM      Failed - This refill cannot be delegated      Failed - Urine Drug Screen completed in last 360 days      Failed - Valid encounter within last 6 months    Recent Outpatient Visits           7 months ago Hypertension, uncontrolled   [place] [facility] Arnold, Brist[place] L, RPH-CPP   7 months ago Hypertension, uncontrolled   [place] [facility] [place], Slaughters L, RPH-CPP   11 months ago Hypertension, uncontrolled   [place] [facility] Marcine Matar, MD   1 year ago Hypertension, uncontrolled   [place] [facility] [place], [place], [place]   2 years ago Hypertension, uncontrolled   [place] [facility] Marcine Matar, MD       Future Appointments             In 4 weeks Chilton Si, MD [facility] at [facility], [place]            Passed - Patient is not pregnant         Requested Prescriptions  Pending Prescriptions Disp Refills   ALPRAZolam (XANAX) 0.25 MG tablet [Pharmacy Med Name: ALPRAZOLAM 0.25 MG TABLET] 2 tablet     Sig: TAKE 1 TO 2 TABLETS BY MOUTH APPROXIMATELY 30 MINUTES PRIOR TO PROCEDURES     Not Delegated - Psychiatry: Anxiolytics/Hypnotics 2 Failed - 11/18/2022 10:39 AM      Failed - This refill cannot be  delegated      Failed - Urine Drug Screen completed in last 360 days      Failed - Valid encounter within last 6 months    Recent Outpatient Visits           7 months ago Hypertension, uncontrolled   [place] [facility] [place], [place] L, RPH-CPP   7 months ago Hypertension, uncontrolled   [facility] Mars, [place] L, RPH-CPP   11 months ago Hypertension, uncontrolled   [place] [facility] Marcine Matar, MD   1 year ago Hypertension, uncontrolled   [place] [facility] [place], [place], [place]   2 years ago Hypertension, uncontrolled   [place] [facility] Marcine Matar, MD       Future Appointments             In 4 weeks Chilton Si, MD [facility] at [facility], [place]            Passed - Patient is not pregnant

## 2022-11-18 NOTE — Telephone Encounter (Signed)
Pt. Reports Theresa [name] has ordered Xanax in the past. Not on current medication list. Needs medication today for procedure tomorrow. Please advise pt.

## 2022-11-18 NOTE — Telephone Encounter (Signed)
PC placed to pt this evening around 6:15 p.m. I inquired what procedure she will be having tomorrow.  I heard she will be having a renal procedure.  Pt's voice kept cutting in and out then phone went out completely.  I sent rxn to her pharmacy for one time dose of Xanax.

## 2022-11-19 ENCOUNTER — Ambulatory Visit (HOSPITAL_COMMUNITY)
Admission: RE | Admit: 2022-11-19 | Discharge: 2022-11-20 | Disposition: A | Discharge status: Home / Self Care | Payer: Medicare Other | Attending: Cardiovascular Disease | Admitting: Cardiovascular Disease

## 2022-11-19 ENCOUNTER — Other Ambulatory Visit: Payer: Self-pay

## 2022-11-19 ENCOUNTER — Encounter (HOSPITAL_COMMUNITY)
Admission: RE | Disposition: A | Discharge status: Home / Self Care | Payer: Self-pay | Source: Home / Self Care | Attending: Cardiovascular Disease

## 2022-11-19 DIAGNOSIS — D693 Immune thrombocytopenic purpura: Secondary | ICD-10-CM | POA: Diagnosis not present

## 2022-11-19 DIAGNOSIS — I1A0 Resistant hypertension: Secondary | ICD-10-CM | POA: Diagnosis present

## 2022-11-19 DIAGNOSIS — E785 Hyperlipidemia, unspecified: Secondary | ICD-10-CM | POA: Insufficient documentation

## 2022-11-19 DIAGNOSIS — I10 Essential (primary) hypertension: Secondary | ICD-10-CM | POA: Insufficient documentation

## 2022-11-19 DIAGNOSIS — Z8249 Family history of ischemic heart disease and other diseases of the circulatory system: Secondary | ICD-10-CM | POA: Insufficient documentation

## 2022-11-19 DIAGNOSIS — D696 Thrombocytopenia, unspecified: Secondary | ICD-10-CM | POA: Diagnosis present

## 2022-11-19 HISTORY — PX: RENAL DENERVATION: CATH118329

## 2022-11-19 LAB — POCT ACTIVATED CLOTTING TIME
Activated Clotting Time: 233 seconds
Activated Clotting Time: 287 seconds

## 2022-11-19 SURGERY — RENAL DENERVATION

## 2022-11-19 MED ORDER — HYDRALAZINE HCL 20 MG/ML IJ SOLN
INTRAMUSCULAR | Status: DC | PRN
  Administered 2022-11-19 (×2): 10 mg via INTRAVENOUS

## 2022-11-19 MED ORDER — FENTANYL CITRATE (PF) 100 MCG/2ML IJ SOLN
INTRAMUSCULAR | Status: AC
  Filled 2022-11-19: qty 2

## 2022-11-19 MED ORDER — SODIUM CHLORIDE 0.9 % IV SOLN: 250.0000 mL | INTRAVENOUS | Status: DC | PRN

## 2022-11-19 MED ORDER — SODIUM CHLORIDE 0.9% FLUSH: 3.0000 mL | Freq: Two times a day (BID) | INTRAVENOUS | Status: DC

## 2022-11-19 MED ORDER — POLYVINYL ALCOHOL-POVIDONE PF 1.4-0.6 % OP SOLN: 1.0000 [drp] | Freq: Every day | OPHTHALMIC | Status: DC

## 2022-11-19 MED ORDER — HYDRALAZINE HCL 20 MG/ML IJ SOLN
10.0000 mg | INTRAMUSCULAR | Status: AC | PRN
  Administered 2022-11-20: 10 mg via INTRAVENOUS
  Filled 2022-11-19: qty 1

## 2022-11-19 MED ORDER — ASPIRIN 81 MG PO TBEC
81.0000 mg | DELAYED_RELEASE_TABLET | Freq: Every day | ORAL | Status: DC
  Administered 2022-11-20: 81 mg via ORAL
  Filled 2022-11-19: qty 1

## 2022-11-19 MED ORDER — POLYVINYL ALCOHOL 1.4 % OP SOLN
1.0000 [drp] | Freq: Every day | OPHTHALMIC | Status: DC
  Administered 2022-11-19: 1 [drp] via OPHTHALMIC
  Filled 2022-11-19: qty 15

## 2022-11-19 MED ORDER — B COMPLEX-C PO TABS
1.0000 | ORAL_TABLET | ORAL | Status: DC
  Administered 2022-11-20: 1 via ORAL
  Filled 2022-11-19: qty 1

## 2022-11-19 MED ORDER — HYDRALAZINE HCL 20 MG/ML IJ SOLN
INTRAMUSCULAR | Status: AC
  Administered 2022-11-19: 10 mg via INTRAVENOUS
  Filled 2022-11-19: qty 1

## 2022-11-19 MED ORDER — BRIMONIDINE TARTRATE-TIMOLOL 0.2-0.5 % OP SOLN: 1.0000 [drp] | Freq: Two times a day (BID) | OPHTHALMIC | Status: DC

## 2022-11-19 MED ORDER — SODIUM CHLORIDE 0.9% FLUSH: 3.0000 mL | INTRAVENOUS | Status: DC | PRN

## 2022-11-19 MED ORDER — SODIUM CHLORIDE 0.9 % WEIGHT BASED INFUSION
1.0000 mL/kg/h | INTRAVENOUS | Status: AC
  Administered 2022-11-19: 1 mL/kg/h via INTRAVENOUS

## 2022-11-19 MED ORDER — METOPROLOL TARTRATE 25 MG PO TABS
25.0000 mg | ORAL_TABLET | Freq: Two times a day (BID) | ORAL | Status: DC
  Administered 2022-11-19 – 2022-11-20 (×3): 25 mg via ORAL
  Filled 2022-11-19: qty 2
  Filled 2022-11-19 (×2): qty 1

## 2022-11-19 MED ORDER — ACETAMINOPHEN 325 MG PO TABS
650.0000 mg | ORAL_TABLET | ORAL | Status: DC | PRN
  Administered 2022-11-19 – 2022-11-20 (×2): 650 mg via ORAL
  Filled 2022-11-19 (×2): qty 2

## 2022-11-19 MED ORDER — SODIUM CHLORIDE 0.9% FLUSH
3.0000 mL | Freq: Two times a day (BID) | INTRAVENOUS | Status: DC
  Administered 2022-11-19: 3 mL via INTRAVENOUS

## 2022-11-19 MED ORDER — B COMPLEX VITAMINS PO CAPS: 1.0000 | ORAL_CAPSULE | ORAL | Status: DC

## 2022-11-19 MED ORDER — MIDAZOLAM HCL 2 MG/2ML IJ SOLN
INTRAMUSCULAR | Status: AC
  Filled 2022-11-19: qty 2

## 2022-11-19 MED ORDER — FOLIC ACID 400 MCG PO TABS: 400.0000 ug | ORAL_TABLET | Freq: Every day | ORAL | Status: DC

## 2022-11-19 MED ORDER — FOLIC ACID 1 MG PO TABS
0.5000 mg | ORAL_TABLET | Freq: Every day | ORAL | Status: DC
  Administered 2022-11-19 – 2022-11-20 (×2): 0.5 mg via ORAL
  Filled 2022-11-19 (×2): qty 1

## 2022-11-19 MED ORDER — ASPIRIN 81 MG PO CHEW: 81.0000 mg | CHEWABLE_TABLET | ORAL | Status: DC

## 2022-11-19 MED ORDER — BRIMONIDINE TARTRATE 0.2 % OP SOLN
1.0000 [drp] | Freq: Two times a day (BID) | OPHTHALMIC | Status: DC
  Administered 2022-11-20 (×2): 1 [drp] via OPHTHALMIC
  Filled 2022-11-19: qty 5

## 2022-11-19 MED ORDER — NITROGLYCERIN 1 MG/10 ML FOR IR/CATH LAB
INTRA_ARTERIAL | Status: DC | PRN
  Administered 2022-11-19 (×2): 200 ug via INTRA_ARTERIAL

## 2022-11-19 MED ORDER — HEPARIN (PORCINE) IN NACL 1000-0.9 UT/500ML-% IV SOLN
INTRAVENOUS | Status: DC | PRN
  Administered 2022-11-19 (×2): 500 mL

## 2022-11-19 MED ORDER — ADULT MULTIVITAMIN W/MINERALS CH
1.0000 | ORAL_TABLET | ORAL | Status: DC
  Administered 2022-11-20: 1 via ORAL
  Filled 2022-11-19: qty 1

## 2022-11-19 MED ORDER — LIDOCAINE HCL (PF) 1 % IJ SOLN
INTRAMUSCULAR | Status: AC
  Filled 2022-11-19: qty 30

## 2022-11-19 MED ORDER — NITROGLYCERIN 1 MG/10 ML FOR IR/CATH LAB
INTRA_ARTERIAL | Status: AC
  Filled 2022-11-19: qty 10

## 2022-11-19 MED ORDER — ENALAPRIL MALEATE 10 MG PO TABS
20.0000 mg | ORAL_TABLET | Freq: Two times a day (BID) | ORAL | Status: DC
  Administered 2022-11-19 – 2022-11-20 (×2): 20 mg via ORAL
  Filled 2022-11-19 (×3): qty 2

## 2022-11-19 MED ORDER — LIDOCAINE HCL (PF) 1 % IJ SOLN
INTRAMUSCULAR | Status: DC | PRN
  Administered 2022-11-19: 15 mL
  Administered 2022-11-19: 5 mL

## 2022-11-19 MED ORDER — SODIUM CHLORIDE 0.9 % WEIGHT BASED INFUSION
3.0000 mL/kg/h | INTRAVENOUS | Status: DC
  Administered 2022-11-19: 3 mL/kg/h via INTRAVENOUS

## 2022-11-19 MED ORDER — LATANOPROST 0.005 % OP SOLN
1.0000 [drp] | Freq: Every day | OPHTHALMIC | Status: DC
  Administered 2022-11-20: 1 [drp] via OPHTHALMIC
  Filled 2022-11-19: qty 2.5

## 2022-11-19 MED ORDER — HEPARIN SODIUM (PORCINE) 1000 UNIT/ML IJ SOLN
INTRAMUSCULAR | Status: DC | PRN
  Administered 2022-11-19 (×2): 2000 [IU] via INTRAVENOUS
  Administered 2022-11-19: 6000 [IU] via INTRAVENOUS

## 2022-11-19 MED ORDER — FENTANYL CITRATE (PF) 100 MCG/2ML IJ SOLN
INTRAMUSCULAR | Status: DC | PRN
  Administered 2022-11-19: 50 ug via INTRAVENOUS
  Administered 2022-11-19 (×2): 25 ug via INTRAVENOUS
  Administered 2022-11-19: 50 ug via INTRAVENOUS

## 2022-11-19 MED ORDER — MIDAZOLAM HCL 2 MG/2ML IJ SOLN
INTRAMUSCULAR | Status: DC | PRN
  Administered 2022-11-19 (×3): 1 mg via INTRAVENOUS

## 2022-11-19 MED ORDER — IODIXANOL 320 MG/ML IV SOLN
INTRAVENOUS | Status: DC | PRN
  Administered 2022-11-19: 240 mL

## 2022-11-19 MED ORDER — ONDANSETRON HCL 4 MG/2ML IJ SOLN: 4.0000 mg | Freq: Four times a day (QID) | INTRAMUSCULAR | Status: DC | PRN

## 2022-11-19 MED ORDER — TIMOLOL MALEATE 0.5 % OP SOLN
1.0000 [drp] | Freq: Two times a day (BID) | OPHTHALMIC | Status: DC
  Administered 2022-11-20 (×2): 1 [drp] via OPHTHALMIC
  Filled 2022-11-19: qty 5

## 2022-11-19 MED ORDER — ACETAMINOPHEN 325 MG PO TABS
ORAL_TABLET | ORAL | Status: AC
  Administered 2022-11-19: 650 mg via ORAL
  Filled 2022-11-19: qty 2

## 2022-11-19 MED ORDER — SODIUM CHLORIDE 0.9 % WEIGHT BASED INFUSION: 1.0000 mL/kg/h | INTRAVENOUS | Status: DC

## 2022-11-19 MED ORDER — HYDRALAZINE HCL 20 MG/ML IJ SOLN
INTRAMUSCULAR | Status: AC
  Filled 2022-11-19: qty 1

## 2022-11-19 SURGICAL SUPPLY — 17 items
CATH ANGIO 5F PIGTAIL 65CM (CATHETERS) IMPLANT
CATH SOS OMNI O 5F 80CM (CATHETERS) IMPLANT
CATH SYMPLICITY SPYRAL RDN (CATHETERS) IMPLANT
CLOSURE PERCLOSE PROSTYLE (VASCULAR PRODUCTS) IMPLANT
KIT ESSENTIALS PG (KITS) IMPLANT
KIT MICROPUNCTURE NIT STIFF (SHEATH) IMPLANT
KIT PV (KITS) IMPLANT
SHEATH PINNACLE 6F 10CM (SHEATH) IMPLANT
SHEATH PROBE COVER 6X72 (BAG) IMPLANT
STOPCOCK MORSE 400PSI 3WAY (MISCELLANEOUS) IMPLANT
SYR MEDRAD MARK 7 150ML (SYRINGE) IMPLANT
TRANSDUCER W/MONITORING KIT (MISCELLANEOUS) IMPLANT
TRAY PV CATH (CUSTOM PROCEDURE TRAY) IMPLANT
TUBING CIL FLEX 10 FLL-RA (TUBING) IMPLANT
WIRE ASAHI GRAND SLAM 180CM (WIRE) IMPLANT
WIRE HITORQ VERSACORE ST 145CM (WIRE) IMPLANT
WIRE SPARTACORE .014X190CM (WIRE) IMPLANT

## 2022-11-19 NOTE — H&P (Signed)
Virtual Visit via Telephone Note    Because of ALAZAE CRYMES co-morbid illnesses, she is at least at moderate risk for complications without adequate follow up.  This format is felt to be most appropriate for this patient at this time.  The patient did not have access to video technology/had technical difficulties with video requiring transitioning to audio format only (telephone).  All issues noted in this document were discussed and addressed.  No physical exam could be performed with this format.  Please refer to the patient's chart for her consent to telehealth for [facility].    The patient was identified using 2 identifiers.   Date:  09/10/2022    ID:  Theresa [name], DOB [birth year], MRN [id number]   Patient Location: Home Provider Location: Office/Clinic   PCP:  Marcine Matar, MD         Cardiologist:  None  Electrophysiologist:  None    Evaluation Performed:  Follow-Up Visit   Chief Complaint:  Hypertension.     History of Present Illness:     The patient does not have symptoms concerning for COVID-19 infection (fever, chills, cough, or new shortness of breath).    Theresa [name] is a 77 y.o. female with a hx of hypertension, hyperlipidemia, palpitations, ITP, anxiety, here to establish care in the [facility]. She saw Dr. Laural Benes 11/2021 and her BP in the office was 170/90 on enalapril and metoprolol. She reported that her home blood pressures were controlled. Dr. Laural Benes increased her enalapril but she reduced it back due to palpitations. She was started on a clonidine patch and referred to the [facility].   At her last appointment her BP was uncontrolled both at home (145-170 systolic) and in the office (166/82), with superimposed white coat hypertension. She has struggled with intolerances to multiple medications. We added 1.25 mg indapamide daily. Renal artery dopplers were repeated 06/2022 showing 1-59% stenosis of the  right renal artery, and no evidence of stenosis in the left. On 06/30/2022 she reported swelling of her tongue and sore throat which she attributed to indapamide so it was discontinued. Her blood pressures remained above goal 07/2022 so 2 mg doxazosin was added. She was concerned that doxazosin was causing palpitations and mouth swelling. She was advised to try holding doxazosin for 1 week. Her symptoms improved so she did not resume doxazosin.   Today, she is feeling much better. At home blood pressures have been averaging in the 150s-160s. Of note, recently she has struggled with multiple life stressors. She confirms that she was unable to tolerate doxazosin: she also noticed that the back of her mouth and throat was bumpy. Lately she is not exercising as much, but she does try. Soon she plans to travel and visit her home country. She denies any palpitations, chest pain, shortness of breath, or peripheral edema. No lightheadedness, headaches, syncope, orthopnea, or PND.   Previous antihypertensives: Clonidine patch (palpitations) Amlodipine (swelling) Candesartan (palpitations) Carvedilol (excessive drowsiness, dizziness and palpitations) Chlorthalidone (palpitations, dizziness) PO clonidine (dizziness)  HCTZ (dizziness, palpitations)  Hydralazine (palpitations) lisinopril (palpitatons) Lisinopril-HCTZ (flushing and insomnia) Spironolactone (dizziness, palpitations) Valsartan (palpitations) Indapamide (sore throat, tongue swelling) Doxazosin (palpitations, mouth lesions/swelling)       Past Medical History:  Diagnosis Date   Gastric ulcer     Hyperlipidemia     Hypertension     River blindness     Sinusitis             Past  Surgical History:  Procedure Laterality Date   APPENDECTOMY       BREAST LUMPECTOMY       CATARACT EXTRACTION Left 01/2020      Current Medications: Active Medications      Current Meds  Medication Sig   acetaminophen (TYLENOL) 500 MG tablet Take 500 mg  by mouth every 6 (six) hours as needed for mild pain or moderate pain.   bimatoprost (LUMIGAN) 0.01 % SOLN Instill 1 drop into both eyes every night   brimonidine-timolol (COMBIGAN) 0.2-0.5 % ophthalmic solution Instill 1 drop into both eyes twice a day   Cholecalciferol (VITAMIN D3 PO) Take by mouth.   enalapril (VASOTEC) 10 MG tablet TAKE 1 TABLET (10 MG ) IN THE MORNING AND 2 TABLETS ( ) IN THE EVENING.   folic acid (FOLVITE) 400 MCG tablet Take by mouth.   metoprolol tartrate (LOPRESSOR) 25 MG tablet TAKE 1 TABLET BY MOUTH TWICE A DAY   Multiple Vitamin (MULTIVITAMIN) tablet Take 1 tablet by mouth daily.   Multiple Vitamins-Minerals (ZINC PO) Take by mouth.        Allergies:   Amlodipine, Coreg [carvedilol], Shellfish allergy, Trazodone and nefazodone, Hydralazine hcl, Augmentin [amoxicillin-pot clavulanate], Indapamide, Clonidine derivatives, Lisinopril, and Lisinopril-hydrochlorothiazide    Social History         Socioeconomic History   Marital status: Widowed      Spouse name: Not on file   Number of children: 2   Years of education: Not on file   Highest education level: Not on file  Occupational History   Not on file  Tobacco Use   Smoking status: Never   Smokeless tobacco: Never  Vaping Use   Vaping Use: Never used  Substance and Sexual Activity   Alcohol use: No   Drug use: No   Sexual activity: Not Currently      Birth control/protection: Post-menopausal  Other Topics Concern   Not on file  Social History Narrative   Not on file    Social Determinants of Health    Financial Resource Strain: Not on file  Food Insecurity: Not on file  Transportation Needs: Not on file  Physical Activity: Not on file  Stress: Not on file  Social Connections: Not on file      Family History: The patient's family history includes Cancer in her brother; Hypertension in her mother.   ROS:   Please see the history of present illness.    (+) Stress All other systems  reviewed and are negative.   EKGs/Labs/Other Studies Reviewed:     Bilateral Renal Artery Dopplers  07/23/2022: Summary:  Renal:    Right: Normal size right kidney. RRV flow present. 1-59% stenosis of         the right renal artery. Normal cortical thickness of right         kidney. Abnormal right Resistive Index.  Left:  Normal size of left kidney. LRV flow present. No evidence of         left renal artery stenosis. Normal cortical thickness of the         left kidney. Abnormal left Resisitve Index.    Bilateral Renal Artery Dopplers  11/01/2015: Summary:  Findings suggest 1-59% renal artery stenosis. There is evidence of  elevated resistive indices in bilateral intrarenal arteries.  Incidental finding: there is evidence of multiple simple cysts in  the left kidney, largest measuring 1.14cm.    EKG:  EKG is personally reviewed. 09/10/2022:  EKG was not ordered.  06/26/2022: Sinus rhythm. Rate 72 bpm. LAFB.   Recent Labs: 07/04/2022: BUN 8; Creatinine, Ser 0.95; Potassium 4.0; Sodium 135; TSH 3.720    Recent Lipid Panel Labs (Brief)          Component Value Date/Time    CHOL 263 (H) 06/06/2021 0935    TRIG 57 06/06/2021 0935    HDL 82 06/06/2021 0935    CHOLHDL 3.2 06/06/2021 0935    CHOLHDL 2.7 09/22/2019 0825    VLDL 8 09/22/2019 0825    LDLCALC 172 (H) 06/06/2021 0935        Physical Exam:     BP (!) 156/96   Pulse (!) 57   Ht 5\' 4"  (1.626 m)   Wt 175 lb (79.4 kg)   BMI 30.04 kg/m  GENERAL: Well-appearing.  No acute distress. HEENT: Pupils equal round.  Oral mucosa unremarkable NECK:  No jugular venous distention, no visible thyromegaly EXT:  No edema, no cyanosis no clubbing SKIN:  No rashes no nodules NEURO:  Speech fluent.  Cranial nerves grossly intact.  Moves all 4 extremities freely PSYCH:  Cognitively intact, oriented to person place and time     ASSESSMENT/PLAN:     White coat syndrome with diagnosis of hypertension Blood pressure remains  uncontrolled.  She did not tolerate indapamide or doxazosin.  We do not have many other medication options.  Suggest renal denervation.  Continue metoprolol and enalapril. She is also struggling with stress.  We will have our care guide reach out to her for stress management.   Shared Decision Making/Informed Consent The risks [stroke (1 in 1000), death (1 in 1000), kidney failure [usually temporary] (1 in 500), bleeding (1 in 200), allergic reaction [possibly serious] (1 in 200)], benefits (diagnostic support and management of coronary artery disease) and alternatives of a renal denervation were discussed in detail with Ms. Shroff and she is willing to proceed.       Screening for Secondary Hypertension:      06/26/2022    2:42 PM  Causes  Drugs/Herbals Screened     - Comments limiting white salt but uses Himalayan salt, no EtOH  Renovascular HTN Screened     - Comments Mild renal artery steonsis.    Relevant Labs/Studies:     Latest Ref Rng & Units 07/04/2022    8:10 AM 12/05/2021   11:11 AM 06/06/2021    9:35 AM  Basic Labs  Sodium 134 - 144 mmol/L 135  139  136   Potassium 3.5 - 5.2 mmol/L 4.0  4.6  4.2   Creatinine 0.57 - 1.00 mg/dL 1.61  0.96  0.45         Latest Ref Rng & Units 07/04/2022    8:10 AM 08/30/2019    8:50 AM  Thyroid   TSH 0.450 - 4.500 uIU/mL 3.720  2.880         Latest Ref Rng & Units 11/26/2015    4:55 PM  Renin/Aldosterone   Aldosterone ng/dL 3               40/98/1191   12:22 PM  Renovascular   Renal Artery [place] Completed Yes      COVID-19 Education: The signs and symptoms of COVID-19 were discussed with the patient and how to seek care for testing (follow up with PCP or arrange E-visit).  The importance of social distancing was discussed today.   Time:   Today, I have spent 30 minutes with the patient with telehealth technology discussing  the above problems.     Disposition:    FU with APP/PharmD in 1 month for the next 3 months.   FU with  Tiffany C. Duke Salvia, MD, [facility] in 4 months.   Medication Adjustments/Labs and Tests Ordered: Current medicines are reviewed at length with the patient today.  Concerns regarding medicines are outlined above.    No orders of the defined types were placed in this encounter.   No orders of the defined types were placed in this encounter.   I,Mathew Stumpf,acting as a Neurosurgeon for Chilton Si, MD.,have documented all relevant documentation on the behalf of Chilton Si, MD,as directed by  Chilton Si, MD while in the presence of Chilton Si, MD.   I, Tiffany C. Duke Salvia, MD have reviewed all documentation for this visit.  The documentation of the exam, diagnosis, procedures, and orders on 09/10/2022 are all accurate and complete.   Signed, Chilton Si, MD  09/10/2022 4:42 PM    [facility]   Addendum on 11/19/2022: The chart was reviewed and the patient was seen and examined.  She has known history of hypertension with whitecoat syndrome and intolerance to multiple antihypertensive medications.  She has been evaluated at advanced hypertension clinic by Dr. Duke Salvia and was felt to be a good candidate for renal denervation.  By physical exam, heart is regular with 1 out of 6 systolic murmur at the base.  Lungs are clear to auscultation with no significant leg edema.  Right femoral pulses normal.  I discussed the procedure in details as well as risk and benefits for renal denervation.  The patient understands and wants to proceed.  She has known history of ITP but her platelet count has been stable around 80,000 with no bleeding complications.

## 2022-11-20 ENCOUNTER — Encounter (HOSPITAL_COMMUNITY): Payer: Self-pay | Admitting: Cardiovascular Disease

## 2022-11-20 ENCOUNTER — Ambulatory Visit (HOSPITAL_BASED_OUTPATIENT_CLINIC_OR_DEPARTMENT_OTHER): Payer: Medicare Other | Admitting: Cardiovascular Disease

## 2022-11-20 DIAGNOSIS — I1A0 Resistant hypertension: Secondary | ICD-10-CM

## 2022-11-20 DIAGNOSIS — I10 Essential (primary) hypertension: Secondary | ICD-10-CM | POA: Diagnosis not present

## 2022-11-20 LAB — CBC
HCT: 30.7 % — ABNORMAL LOW (ref 36.0–46.0)
Hemoglobin: 10.2 g/dL — ABNORMAL LOW (ref 12.0–15.0)
MCH: 27.4 pg (ref 26.0–34.0)
MCHC: 33.2 g/dL (ref 30.0–36.0)
MCV: 82.5 fL (ref 80.0–100.0)
Platelets: 74 10*3/uL — ABNORMAL LOW (ref 150–400)
RBC: 3.72 MIL/uL — ABNORMAL LOW (ref 3.87–5.11)
RDW: 13.4 % (ref 11.5–15.5)
WBC: 4.4 10*3/uL (ref 4.0–10.5)
nRBC: 0 % (ref 0.0–0.2)

## 2022-11-20 LAB — LIPID PANEL
Cholesterol: 231 mg/dL — ABNORMAL HIGH (ref 0–200)
HDL: 62 mg/dL (ref >40)
LDL Cholesterol: 160 mg/dL — ABNORMAL HIGH (ref 0–99)
Total CHOL/HDL Ratio: 3.7 RATIO
Triglycerides: 47 mg/dL (ref <150)
VLDL: 9 mg/dL (ref 0–40)

## 2022-11-20 LAB — BASIC METABOLIC PANEL
Anion gap: 9 (ref 5–15)
BUN: 12 mg/dL (ref 8–23)
CO2: 23 mmol/L (ref 22–32)
Calcium: 8.4 mg/dL — ABNORMAL LOW (ref 8.9–10.3)
Chloride: 108 mmol/L (ref 98–111)
Creatinine, Ser: 0.92 mg/dL (ref 0.44–1.00)
GFR, Estimated: >60 mL/min (ref >60)
Glucose, Bld: 93 mg/dL (ref 70–99)
Potassium: 3.7 mmol/L (ref 3.5–5.1)
Sodium: 140 mmol/L (ref 135–145)

## 2022-11-20 NOTE — Discharge Summary (Addendum)
Discharge Summary    Patient ID: Theresa [name] MRN: [id number]; DOB: 1945-11-26  Admit date: 11/19/2022 Discharge date: 11/20/2022  PCP:  Marcine Matar, MD   [facility] Providers Cardiologist:  Dr. Peter [place] Hypertensive clinic: Dr. Duke Salvia   Discharge Diagnoses    Principal Problem:   Resistant hypertension Active Problems:   Hyperlipidemia   Thrombocytopenia    Diagnostic Studies/Procedures    Renal denervation study 11/19/2022 1. 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 1 extra accessory artery on each side were not treated due to vessel diameter less than 3 mm.   2.  Difficult procedure overall due to unusual anterior takeoff of the right renal artery with difficult engagement as well as significant tortuosity of the renal arteries.   Recommendations: Due to excessive contrast use and length of procedure, will observe overnight and hydrate to decrease the chance of contrast-induced nephropathy. _____________   History of Present Illness     Theresa [name] is a 77 y.o. female with a hx of hypertension, hyperlipidemia, palpitations, ITP, anxiety, here to establish care in the [facility]. She saw Dr. Laural Benes 11/2021 and her BP in the office was 170/90 on enalapril and metoprolol. She reported that her home blood pressures were controlled. Dr. Laural Benes increased her enalapril but she reduced it back due to palpitations. She was started on a clonidine patch and referred to the [facility].   At her last appointment her BP was uncontrolled both at home (145-170 systolic) and in the office (166/82), with superimposed white coat hypertension. She has struggled with intolerances to multiple medications. We added 1.25 mg indapamide daily. Renal artery dopplers were repeated 06/2022 showing 1-59% stenosis of the right renal artery, and no evidence of stenosis in the left. On  06/30/2022 she reported swelling of her tongue and sore throat which she attributed to indapamide so it was discontinued. Her blood pressures remained above goal 07/2022 so 2 mg doxazosin was added. She was concerned that doxazosin was causing palpitations and mouth swelling. She was advised to try holding doxazosin for 1 week. Her symptoms improved so she did not resume doxazosin.   Today, she is feeling much better. At home blood pressures have been averaging in the 150s-160s. Of note, recently she has struggled with multiple life stressors. She confirms that she was unable to tolerate doxazosin: she also noticed that the back of her mouth and throat was bumpy. Lately she is not exercising as much, but she does try. Soon she plans to travel and visit her home country. She denies any palpitations, chest pain, shortness of breath, or peripheral edema. No lightheadedness, headaches, syncope, orthopnea, or PND.  Hospital Course     Consultants: N/A   Patient was found to be a good candidate for renal denervation study.  She presented for the scheduled study on 11/19/2022.  The study was performed by Dr. Kirke Corin.  She underwent successful renal denervation to bilateral renal arteries using Symplicity Spyral device.  Extra accessory artery on each side were not treated due to vessel diameter less than 3 mm.  It was a difficult procedure overall due to unusual anterior takeoff of the right renal artery with difficult engagement as well as significant tortuosity of renal arteries.  Due to excessive contrast use and the length of the procedure, patient was kept in the hospital for observation and IV hydration.  She was seen on the following morning on 11/20/2022 by  Dr. Clifton James, at which time she was doing well.  Renal function and electrolyte were normal.  Hemoglobin 10.2 after hydration.  Platelet is borderline low at 74, however this is near her baseline and that she has a history of thrombocytopenia.  Overnight  telemetry did reveal 15 beats run of SVT versus atrial tachycardia.  She is currently scheduled to see Dr. Duke Salvia in May.      Did the patient have an acute coronary syndrome (MI, NSTEMI, STEMI, etc) this admission?:  No                               Did the patient have a percutaneous coronary intervention (stent / angioplasty)?:  No.          _____________  Discharge Vitals Blood pressure (!) 178/65, pulse 72, temperature 98.3 F (36.8 C), temperature source Oral, resp. rate (!) 21, height  (1.626 m), weight 78.9 kg, SpO2 100 %.  Filed Weights   11/19/22 0709  Weight: 78.9 kg    Labs & Radiologic Studies    CBC Recent Labs    11/18/22 0000 11/20/22 0414  WBC 5.9 4.4  HGB 12.7 10.2*  HCT 37.7 30.7*  MCV 82 82.5  PLT 81* 74*   Basic Metabolic Panel Recent Labs    19/14/78 0000 11/20/22 0204  NA 142 140  K 4.7 3.7  CL 105 108  CO2 30* 23  GLUCOSE 87 93  BUN 15 12  CREATININE 1.14* 0.92  CALCIUM 9.4 8.4*   Liver Function Tests No results for input(s): "AST", "ALT", "ALKPHOS", "BILITOT", "PROT", "ALBUMIN" in the last 72 hours. No results for input(s): "LIPASE", "AMYLASE" in the last 72 hours. High Sensitivity Troponin:   No results for input(s): "TROPONINIHS" in the last 720 hours.  BNP Invalid input(s): "POCBNP" D-Dimer No results for input(s): "DDIMER" in the last 72 hours. Hemoglobin A1C No results for input(s): "HGBA1C" in the last 72 hours. Fasting Lipid Panel Recent Labs    11/20/22 0204  CHOL 231*  HDL 62  LDLCALC 160*  TRIG 47  CHOLHDL 3.7   Thyroid Function Tests No results for input(s): "TSH", "T4TOTAL", "T3FREE", "THYROIDAB" in the last 72 hours.  Invalid input(s): "FREET3" _____________  PERIPHERAL VASCULAR CATHETERIZATION  Result Date: 11/19/2022 1. 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 1 extra accessory artery on each side were not treated due to vessel diameter less  than 3 mm. 2.  Difficult procedure overall due to unusual anterior takeoff of the right renal artery with difficult engagement as well as significant tortuosity of the renal arteries. Recommendations: Due to excessive contrast use and length of procedure, will observe overnight and hydrate to decrease the chance of contrast-induced nephropathy.  Disposition   Pt is being discharged home today in good condition.  Follow-up Plans & Appointments     Follow-up Information     Chilton Si, MD Follow up on 12/17/2022.   Specialty: Cardiology Why: 9:30AM. Cardiology follow up Contact information: [address] [place] [address] [phone]                Discharge Instructions     Diet - low sodium heart healthy   Complete by: As directed    Discharge instructions   Complete by: As directed    No lifting over 5 lbs for 1 week. No sexual activity for 1 week. Keep procedure site clean &  dry. If you notice increased pain, swelling, bleeding or pus, call/return!  You may shower, but no soaking baths/hot tubs/pools for 1 week.   Increase activity slowly   Complete by: As directed         Discharge Medications   Allergies as of 11/20/2022       Reactions   Amlodipine Swelling   Coreg [carvedilol] Other (See Comments)   Drowsiness, dizziness, palpitations   Shellfish Allergy Swelling   Trazodone And Nefazodone Swelling   Possible eye pain and swelling   Hydralazine Hcl Palpitations   Augmentin [amoxicillin-pot Clavulanate]    Cardura [doxazosin]    SWELLING IN MOUTH   Indapamide    Lip swelling and palpitations    Clonidine Derivatives Other (See Comments)   "heart came out of chest"   Lisinopril Palpitations   Lisinopril-hydrochlorothiazide Other (See Comments)   Possible flushing, insomnia        Medication List     STOP taking these medications    aspirin EC 81 MG tablet       TAKE these medications    acetaminophen 500 MG  tablet Commonly known as: TYLENOL Take 500 mg by mouth every 6 (six) hours as needed for mild pain or moderate pain.   ALPRAZolam 0.25 MG tablet Commonly known as: XANAX TAKE 1 TABLET BY MOUTH APPROXIMATELY 30 MINUTES PRIOR TO PROCEDURES   ascorbic acid 500 MG tablet Commonly known as: VITAMIN C Take 500 mg by mouth 2 (two) times daily.   b complex vitamins capsule Take 1 capsule by mouth 2 (two) times a week.   brimonidine-timolol 0.2-0.5 % ophthalmic solution Commonly known as: Combigan Instill 1 drop into both eyes twice a day   enalapril 10 MG tablet Commonly known as: VASOTEC Take 1 tablet my mouth in the morning and 2 tablets in the evening.   folic acid 400 MCG tablet Commonly known as: FOLVITE Take 400 mcg by mouth daily.   Lumigan 0.01 % Soln Generic drug: bimatoprost Instill 1 drop into both eyes every night   metoprolol tartrate 25 MG tablet Commonly known as: LOPRESSOR Take 1 tablet (25 mg total) by mouth 2 (two) times daily.   multivitamin tablet Take 1 tablet by mouth 2 (two) times a week.   Refresh 1.4-0.6 % Soln Generic drug: Polyvinyl Alcohol-Povidone PF Place 1 drop into both eyes at bedtime.   VITAMIN D3 PO Take 1 tablet by mouth 3 (three) times a week.   ZINC PO Take 1 tablet by mouth 3 (three) times a week.           Outstanding Labs/Studies   N/A  Duration of Discharge Encounter   Greater than 30 minutes including physician time.  Ramond Dial, PA 11/20/2022, 10:30 AM    I have personally seen and examined this patient. I agree with the assessment and plan as outlined above.  See my full note on day of discharge.   Verne Carrow, MD, [facility] 11/21/2022 9:40 AM

## 2022-11-20 NOTE — Progress Notes (Signed)
Rounding Note    Patient Name: Theresa [name] Date of Encounter: 11/20/2022  [facility] Cardiologist:  Theresa [name]  Subjective   Palpitations this am  Inpatient Medications    Scheduled Meds:  aspirin EC  81 mg Oral Daily   B-complex with vitamin C  1 tablet Oral Once per day on Mon Thu   brimonidine  1 drop Both Eyes BID   And   timolol  1 drop Both Eyes BID   enalapril  20 mg Oral BID   folic acid  0.5 mg Oral Daily   latanoprost  1 drop Both Eyes QHS   metoprolol tartrate  25 mg Oral BID   multivitamin with minerals  1 tablet Oral Once per day on Mon Thu   polyvinyl alcohol  1 drop Both Eyes QHS   sodium chloride flush  3 mL Intravenous Q12H   Continuous Infusions:  sodium chloride     PRN Meds: sodium chloride, acetaminophen, ondansetron (ZOFRAN) IV, sodium chloride flush   Vital Signs    Vitals:   11/19/22 2318 11/20/22 0455 11/20/22 0500 11/20/22 0723  BP: (!) 169/57 (!) 184/62 (!) 184/62 (!) 178/65  Pulse: 73 61 (!) 52 72  Resp: (!) 21  Temp:  98.3 F (36.8 C)    TempSrc:  Oral    SpO2: 100% 100% 100% 100%  Weight:      Height:        Intake/Output Summary (Last 24 hours) at 11/20/2022 0754 Last data filed at 11/20/2022 0454 Gross per 24 hour  Intake --  Output 1300 ml  Net -1300 ml      11/19/2022    7:09 AM 09/11/2022   11:01 AM 09/10/2022    1:46 PM  Last 3 Weights  Weight (lbs) 174 lb 175 lb 175 lb  Weight (kg) 78.926 kg 79.379 kg 79.379 kg      Telemetry    Sinus, 15 beat run of SVT  - Personally Reviewed  ECG    No AM eKG  Physical Exam   GEN: No acute distress.   Neck: No JVD Cardiac: RRR, no murmurs, rubs, or gallops.  Respiratory: Clear to auscultation bilaterally. GI: Soft, nontender, non-distended  EXT: right groin soft. No LE edema Neuro:  Nonfocal  Psych: Normal affect   Labs    High Sensitivity Troponin:  No results for input(s): "TROPONINIHS" in the last 720 hours.   Chemistry Recent Labs   Lab 11/18/22 0000 11/20/22 0204  NA 142 140  K 4.7 3.7  CL 105 108  CO2 30* 23  GLUCOSE 87 93  BUN 15 12  CREATININE 1.14* 0.92  CALCIUM 9.4 8.4*  GFRNONAA  --  >60  ANIONGAP  --  9    Lipids  Recent Labs  Lab 11/20/22 0204  CHOL 231*  TRIG 47  HDL 62  LDLCALC 160*  CHOLHDL 3.7    Hematology Recent Labs  Lab 11/18/22 0000 11/20/22 0414  WBC 5.9 4.4  RBC 4.59 3.72*  HGB 12.7 10.2*  HCT 37.7 30.7*  MCV 82 82.5  MCH 27.7 27.4  MCHC 33.7 33.2  RDW 14.6 13.4  PLT 81* 74*   Thyroid No results for input(s): "TSH", "FREET4" in the last 168 hours.  BNPNo results for input(s): "BNP", "PROBNP" in the last 168 hours.  DDimer No results for input(s): "DDIMER" in the last 168 hours.   Radiology    PERIPHERAL VASCULAR CATHETERIZATION  Result Date: 11/19/2022 1.  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 1 extra accessory artery on each side were not treated due to vessel diameter less than 3 mm. 2.  Difficult procedure overall due to unusual anterior takeoff of the right renal artery with difficult engagement as well as significant tortuosity of the renal arteries. Recommendations: Due to excessive contrast use and length of procedure, will observe overnight and hydrate to decrease the chance of contrast-induced nephropathy.   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   Assessment & Plan    HTN/Post renal denervation procedure: No issues overnight. Both renal arteries were denervated. Pt monitored overnight due to contrast load. Renal function is stable today. Platelets stable. Right groin without hematoma. Continue enalapril and metoprolol.   SVT vs atrial tachycardia: 15 beat run overnight. She has intolerance to hydralazine listed in her chart (palpitations). She did receive this at 5am. Nothing to change. Continue metoprolol.    Discharge home today.   For questions or updates, please contact [facility] Please consult [email or web address] for contact info under        Signed, Theresa Carrow, MD  11/20/2022, 7:54 AM

## 2022-11-20 NOTE — Progress Notes (Signed)
Discharge instructions given. Patient verbalized understanding and all questions were answered.  ?

## 2022-11-21 ENCOUNTER — Ambulatory Visit (HOSPITAL_BASED_OUTPATIENT_CLINIC_OR_DEPARTMENT_OTHER): Payer: Medicare Other | Admitting: Cardiovascular Disease

## 2022-11-21 LAB — BASIC METABOLIC PANEL WITH GFR

## 2022-11-21 LAB — CBC

## 2022-11-27 ENCOUNTER — Telehealth: Payer: Self-pay

## 2022-11-27 DIAGNOSIS — I10 Essential (primary) hypertension: Secondary | ICD-10-CM

## 2022-11-27 NOTE — Telephone Encounter (Signed)
Copied from CRM [id number]. Topic: General - Inquiry >> Nov 27, 2022 11:24 AM De Blanch wrote: Reason for CRM: Pt stated she wanted to let Dr.Johnson know she is traveling out of the country and needs her medication enalapril (VASOTEC) 10 MG tablet; metoprolol tartrate (LOPRESSOR) 25 MG tablet extended. She stated she is traveling in June for over 6 months.  Asked that I let Dr.Johnson know how much she appreciates her and that her surgery is done. Please advise.

## 2022-11-27 NOTE — Telephone Encounter (Signed)
Patient has rxns with refills down at our pharmacy that will be enough to cover the time period she'll be gone. She only has to go downstairs to place these.

## 2022-12-01 NOTE — Telephone Encounter (Signed)
Called & spoke to the patient. Verified name & DOB. Informed patient to go to her pharmacy and let them know that she will be needing her additional refills while out of the country. There are enough refills to cover while on her time away. Patient expressed verbal understanding.

## 2022-12-17 ENCOUNTER — Ambulatory Visit (INDEPENDENT_AMBULATORY_CARE_PROVIDER_SITE_OTHER): Payer: Medicare Other | Admitting: Cardiovascular Disease

## 2022-12-17 ENCOUNTER — Encounter (HOSPITAL_BASED_OUTPATIENT_CLINIC_OR_DEPARTMENT_OTHER): Payer: Self-pay | Admitting: Cardiovascular Disease

## 2022-12-17 VITALS — BP 204/96 | HR 54 | Ht 64.0 in | Wt 174.8 lb

## 2022-12-17 DIAGNOSIS — F419 Anxiety disorder, unspecified: Secondary | ICD-10-CM

## 2022-12-17 DIAGNOSIS — I10 Essential (primary) hypertension: Secondary | ICD-10-CM

## 2022-12-17 DIAGNOSIS — E782 Mixed hyperlipidemia: Secondary | ICD-10-CM | POA: Diagnosis not present

## 2022-12-17 DIAGNOSIS — I1A0 Resistant hypertension: Secondary | ICD-10-CM | POA: Diagnosis not present

## 2022-12-17 HISTORY — DX: Anxiety disorder, unspecified: F41.9

## 2022-12-17 MED ORDER — LABETALOL HCL 100 MG PO TABS: 100.0000 mg | ORAL_TABLET | Freq: Two times a day (BID) | ORAL | 3 refills | Status: DC

## 2022-12-17 NOTE — Assessment & Plan Note (Signed)
Blood pressure uncontrolled after renal denervation.  I explained to her that it takes several months to see the first benefit.  The benefit continues over time.  For now, we will continue enalapril.  Will try to stop metoprolol and start labetalol 100 mg twice daily.  She leaves the country on June 15.  Will try to get her back into see her pharmacist or APP prior to that departure.  Encouraged her to continue working on exercise and her anxiety.  Another option may be minoxidil as it does not seem she has tried this medication.  However I do not feel comfortable starting that before she leaves the country.

## 2022-12-17 NOTE — Progress Notes (Signed)
[facility] Follow-up:   Date:  12/17/2022   ID:  Theresa [name], DOB 19-Jan-1946, MRN [id number]  PCP:  Marcine Matar, MD  Cardiologist:  None  Electrophysiologist:  None   Evaluation Performed:  Follow-Up Visit  Chief Complaint:  Hypertension.   History of Present Illness:    Theresa [name] is a 77 y.o. female with a hx of hypertension, hyperlipidemia, palpitations, ITP, anxiety, here for follow-up. She was initially seen 06/26/2022 in the [facility]. She saw Dr. Laural Benes 11/2021 and her BP in the office was 170/90 on enalapril and metoprolol. She reported that her home blood pressures were controlled. Dr. Laural Benes increased her enalapril but she reduced it back due to palpitations. She was started on a clonidine patch and referred to the [facility].  Her BP was uncontrolled both at home (145-170 systolic) and in the office (166/82), with superimposed white coat hypertension. She has struggled with intolerances to multiple medications. We added 1.25 mg indapamide daily. Renal artery dopplers were repeated 06/2022 showing 1-59% stenosis of the right renal artery, and no evidence of stenosis in the left. On 06/30/2022 she reported swelling of her tongue and sore throat which she attributed to indapamide so it was discontinued. Her blood pressures remained above goal 07/2022 so 2 mg doxazosin was added. She was concerned that doxazosin was causing palpitations and mouth swelling. She was advised to try holding doxazosin for 1 week. Her symptoms improved so she did not resume doxazosin.  We had a telephone visit 08/2022. She reported home blood pressures averaging 150s-160s in the setting of multiple life stressors. We asked our care guide to reach out to her for stress management. She was referred for renal denervation which was performed 11/19/2022 with Dr. Kirke Corin and was successful in the bilateral renal arteries.  Today, she complains of  a headache. She didn't have much sleep last night. Her blood pressure is elevated to 194/82 in the office, and is 204/96 on manual recheck. She presents a BP log since her renal denervation, with a range of readings in the 140s-160s systolic, averaging in the 150s. Her current regimen includes metoprolol and enalapril. She also has been under significant stress. Sometimes she is unable to feel relaxed, her shoulders will often feel tense. She states that she has been walking and stretching for exercise. No recurrent chest pain, and her breathing has been perfect. She denies any palpitations, peripheral edema, lightheadedness, syncope, orthopnea, or PND.  Previous antihypertensives: Clonidine patch (palpitations) Amlodipine (swelling) Candesartan (palpitations) Carvedilol (excessive drowsiness, dizziness and palpitations) Chlorthalidone (palpitations, dizziness) PO clonidine (dizziness)  HCTZ (dizziness, palpitations)  Hydralazine (palpitations) lisinopril (palpitatons) Lisinopril-HCTZ (flushing and insomnia) Spironolactone (dizziness, palpitations) Valsartan (palpitations) Indapamide (sore throat, tongue swelling) Doxazosin (palpitations, mouth lesions/swelling)  Past Medical History:  Diagnosis Date   Anxiety 12/17/2022   Gastric ulcer    Hyperlipidemia    Hypertension    River blindness    Sinusitis     Past Surgical History:  Procedure Laterality Date   APPENDECTOMY     BREAST LUMPECTOMY     CATARACT EXTRACTION Left 01/2020   RENAL DENERVATION N/A 11/19/2022   Procedure: RENAL DENERVATION;  Surgeon: Iran Ouch, MD;  Location: [facility];  Service: Cardiovascular;  Laterality: N/A;    Current Medications: Current Meds  Medication Sig   acetaminophen (TYLENOL) 500 MG tablet Take 500 mg by mouth every 6 (six) hours as needed for mild pain or moderate pain.  ALPRAZolam (XANAX) 0.25 MG tablet TAKE 1 TABLET BY MOUTH APPROXIMATELY 30 MINUTES PRIOR TO PROCEDURES    ascorbic acid (VITAMIN C) 500 MG tablet Take 500 mg by mouth 2 (two) times daily.   b complex vitamins capsule Take 1 capsule by mouth 2 (two) times a week.   bimatoprost (LUMIGAN) 0.01 % SOLN Instill 1 drop into both eyes every night   brimonidine-timolol (COMBIGAN) 0.2-0.5 % ophthalmic solution Instill 1 drop into both eyes twice a day   Cholecalciferol (VITAMIN D3 PO) Take 1 tablet by mouth 3 (three) times a week.   enalapril (VASOTEC) 10 MG tablet Take 1 tablet my mouth in the morning and 2 tablets in the evening.   folic acid (FOLVITE) 400 MCG tablet Take 400 mcg by mouth daily.   metoprolol tartrate (LOPRESSOR) 25 MG tablet Take 1 tablet (25 mg total) by mouth 2 (two) times daily.   Multiple Vitamin (MULTIVITAMIN) tablet Take 1 tablet by mouth 2 (two) times a week.   Multiple Vitamins-Minerals (ZINC PO) Take 1 tablet by mouth 3 (three) times a week.   Polyvinyl Alcohol-Povidone PF (REFRESH) 1.4-0.6 % SOLN Place 1 drop into both eyes at bedtime.     Allergies:   Amlodipine, Coreg [carvedilol], Shellfish allergy, Trazodone and nefazodone, Hydralazine hcl, Augmentin [amoxicillin-pot clavulanate], Cardura [doxazosin], Indapamide, Clonidine derivatives, Lisinopril, and Lisinopril-hydrochlorothiazide   Social History   Socioeconomic History   Marital status: Widowed    Spouse name: Not on file   Number of children: 2   Years of education: Not on file   Highest education level: Not on file  Occupational History   Not on file  Tobacco Use   Smoking status: Never   Smokeless tobacco: Never  Vaping Use   Vaping Use: Never used  Substance and Sexual Activity   Alcohol use: No   Drug use: No   Sexual activity: Not Currently    Birth control/protection: Post-menopausal  Other Topics Concern   Not on file  Social History Narrative   Not on file   Social Determinants of Health   Financial Resource Strain: Low Risk  (09/11/2022)   Overall Financial Resource Strain (CARDIA)     Difficulty of Paying Living Expenses: Not hard at all  Food Insecurity: No Food Insecurity (09/11/2022)   Hunger Vital Sign    Worried About Running Out of Food in the Last Year: Never true    Ran Out of Food in the Last Year: Never true  Transportation Needs: No Transportation Needs (09/11/2022)   PRAPARE - Administrator, Civil Service (Medical): No    Lack of Transportation (Non-Medical): No  Physical Activity: Inactive (09/11/2022)   Exercise Vital Sign    Days of Exercise per Week: 0 days    Minutes of Exercise per Session: 0 min  Stress: No Stress Concern Present (09/11/2022)   [employer] of Occupational Health - Occupational Stress Questionnaire    Feeling of Stress : Not at all  Social Connections: Not on file     Family History: The patient's family history includes Cancer in her brother; Hypertension in her mother.  ROS:   Please see the history of present illness.    (+) Headache (+) Tenseness of shoulders (+) Stress All other systems reviewed and are negative.  EKGs/Labs/Other Studies Reviewed:    Renal Denervation  11/19/2022: 1. 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 1 extra accessory artery on each side were not treated due to  vessel diameter less than 3 mm.   2.  Difficult procedure overall due to unusual anterior takeoff of the right renal artery with difficult engagement as well as significant tortuosity of the renal arteries.   Recommendations: Due to excessive contrast use and length of procedure, will observe overnight and hydrate to decrease the chance of contrast-induced nephropathy.  Bilateral Renal Artery Dopplers  07/23/2022: Summary:  Renal:    Right: Normal size right kidney. RRV flow present. 1-59% stenosis of         the right renal artery. Normal cortical thickness of right         kidney. Abnormal right Resistive Index.  Left:  Normal size of left kidney. LRV flow present.  No evidence of         left renal artery stenosis. Normal cortical thickness of the         left kidney. Abnormal left Resisitve Index.   Bilateral Renal Artery Dopplers  11/01/2015: Summary:  Findings suggest 1-59% renal artery stenosis. There is evidence of  elevated resistive indices in bilateral intrarenal arteries.  Incidental finding: there is evidence of multiple simple cysts in  the left kidney, largest measuring 1.14cm.   EKG:  EKG is personally reviewed. 12/17/2022:  EKG was not ordered. 09/10/2022:  EKG was not ordered. 06/26/2022: Sinus rhythm. Rate 72 bpm. LAFB.  Recent Labs: 07/04/2022: TSH 3.720 11/20/2022: BUN 12; Creatinine, Ser 0.92; Hemoglobin 10.2; Platelets 74; Potassium 3.7; Sodium 140   Recent Lipid Panel    Component Value Date/Time   CHOL 231 (H) 11/20/2022 0204   CHOL 263 (H) 06/06/2021 0935   TRIG 47 11/20/2022 0204   HDL 62 11/20/2022 0204   HDL 82 06/06/2021 0935   CHOLHDL 3.7 11/20/2022 0204   VLDL 9 11/20/2022 0204   LDLCALC 160 (H) 11/20/2022 0204   LDLCALC 172 (H) 06/06/2021 0935    Physical Exam:    VS:  BP (!) 204/96 (BP Location: Left Arm, Patient Position: Sitting, Cuff Size: Large)   Pulse (!) 54   Ht 5\' 4"  (1.626 m)   Wt 174 lb 12.8 oz (79.3 kg)   SpO2 94%   BMI 30.00 kg/m  , BMI Body mass index is 30 kg/m. GENERAL:  Well appearing HEENT: Pupils equal round and reactive, fundi not visualized, oral mucosa unremarkable NECK:  No jugular venous distention, waveform within normal limits, carotid upstroke brisk and symmetric, no bruits, no thyromegal LUNGS:  Clear to auscultation bilaterally HEART:  RRR.  PMI not displaced or sustained,S1 and S2 within normal limits, no S3, no S4, no clicks, no rubs, no murmurs ABD:  Flat, positive bowel sounds normal in frequency in pitch, no bruits, no rebound, no guarding, no midline pulsatile mass, no hepatomegaly, no splenomegaly EXT:  2 plus pulses throughout, no edema, no cyanosis no clubbing SKIN:   No rashes no nodules NEURO:  Cranial nerves II through XII grossly intact, motor grossly intact throughout PSYCH:  Cognitively intact, oriented to person place and time  ASSESSMENT/PLAN:    Anxiety She is struggling with anxiety regarding mass deaths of family members during wartime in her country and other life stressors.   Offered for her to meet with our care guide today but she declined due to feeling very sleepy.  Recommend that she follow-up with her PCP.  Given her multiple medication intolerances I will not start an SSRI at this time.  White coat syndrome with diagnosis of hypertension Blood pressure uncontrolled after renal denervation.  I  explained to her that it takes several months to see the first benefit.  The benefit continues over time.  For now, we will continue enalapril.  Will try to stop metoprolol and start labetalol 100 mg twice daily.  She leaves the country on June 15.  Will try to get her back into see her pharmacist or APP prior to that departure.  Encouraged her to continue working on exercise and her anxiety.  Another option may be minoxidil as it does not seem she has tried this medication.  However I do not feel comfortable starting that before she leaves the country.  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  Evaluation of secondary causes was unremarkable.  Study was complicated due to her anatomy.    Screening for Secondary Hypertension:     06/26/2022    2:42 PM  Causes  Drugs/Herbals Screened     - Comments limiting white salt but uses Himalayan salt, no EtOH  Renovascular HTN Screened     - Comments Mild renal artery steonsis.    Relevant Labs/Studies:    Latest Ref Rng & Units 11/20/2022    2:04 AM 11/18/2022    8:55 AM 11/18/2022   12:00 AM  Basic Labs  Sodium 135 - 145 mmol/L 140  CANCELED  142   Potassium 3.5 - 5.1 mmol/L 3.7  CANCELED  4.7   Creatinine 0.44 - 1.00 mg/dL 9.60  CANCELED  4.54        Latest Ref Rng & Units  07/04/2022    8:10 AM 08/30/2019    8:50 AM  Thyroid   TSH 0.450 - 4.500 uIU/mL 3.720  2.880        Latest Ref Rng & Units 11/26/2015    4:55 PM  Renin/Aldosterone   Aldosterone ng/dL 3              09/81/1914   12:22 PM  Renovascular   Renal Artery [place] Completed Yes    Disposition:    FU with APP prior to 01/10/23 as she will be leaving the country for 7-8 months.  Medication Adjustments/Labs and Tests Ordered: Current medicines are reviewed at length with the patient today.  Concerns regarding medicines are outlined above.   No orders of the defined types were placed in this encounter.  No orders of the defined types were placed in this encounter.  I,Mathew Stumpf,acting as a Neurosurgeon for Chilton Si, MD.,have documented all relevant documentation on the behalf of Chilton Si, MD,as directed by  Chilton Si, MD while in the presence of Chilton Si, MD.  I, Giancarlos Berendt C. Duke Salvia, MD have reviewed all documentation for this visit.  The documentation of the exam, diagnosis, procedures, and orders on 12/17/2022 are all accurate and complete.  Signed, Chilton Si, MD  12/17/2022 10:12 AM    [facility]

## 2022-12-17 NOTE — Patient Instructions (Addendum)
Medication Instructions:  STOP METOPROLOL   START LABETALOL 100 MG TWICE A DAY   Labwork: NONE  Testing/Procedures: NONE   Follow-Up:   12/29/2022 10:20 am with Dr Duke Salvia

## 2022-12-17 NOTE — Assessment & Plan Note (Signed)
This was renal artery denervation.  Evaluation of secondary causes was unremarkable.  Study was complicated due to her anatomy.

## 2022-12-17 NOTE — Assessment & Plan Note (Signed)
She is struggling with anxiety regarding mass deaths of family members during wartime in her country and other life stressors.   Offered for her to meet with our care guide today but she declined due to feeling very sleepy.  Recommend that she follow-up with her PCP.  Given her multiple medication intolerances I will not start an SSRI at this time.

## 2022-12-23 ENCOUNTER — Ambulatory Visit: Payer: Self-pay

## 2022-12-23 ENCOUNTER — Telehealth: Payer: Self-pay | Admitting: Cardiovascular Disease

## 2022-12-23 MED ORDER — ENALAPRIL MALEATE 20 MG PO TABS: 20.0000 mg | ORAL_TABLET | Freq: Two times a day (BID) | ORAL | 0 refills | Status: DC

## 2022-12-23 MED ORDER — METOPROLOL TARTRATE 25 MG PO TABS: 25.0000 mg | ORAL_TABLET | Freq: Two times a day (BID) | ORAL | 0 refills | Status: DC

## 2022-12-23 NOTE — Telephone Encounter (Signed)
Spoke with the patient who states that she has been feeling tired and dizzy since she has started on labetolol. She also states that she has been having burning in her throat/mouth. She denies any difficulty breathing. She has not taken her labetolol today. She would like to know if she could go back on metoprolol but on a higher dose.

## 2022-12-23 NOTE — Telephone Encounter (Signed)
Reviewed recommendations and sent 1 year supply of Metoprolol and Enalapril sent to pharmacy as requested

## 2022-12-23 NOTE — Telephone Encounter (Signed)
Dec 23, 2022 Chilton Si, MD  to Frutoso Schatz, RN  Me     12/23/22  1:15 PM No she cannot take a higher dose of metoprolol.  Her heart rate was 54 in the office and that would be unsafe.  She can start back on the metoprolol at the same dose.  Increase enalapril to 20mg  bid.    TCR  Left message to call back

## 2022-12-23 NOTE — Telephone Encounter (Signed)
Patient is following up. She is very worried and she would like to know if another MD is able to advise until Dr. Duke Salvia returns to the office. Please advise.

## 2022-12-23 NOTE — Telephone Encounter (Signed)
Patient called again stating she is confused about whether she should take her medication today.  Patient stated she want to go back to metoprolol tartrate (LOPRESSOR) tablet 50 mg   Patient wants call back to discuss this medication change.

## 2022-12-23 NOTE — Telephone Encounter (Signed)
Patient is returning call. Transferred to [place], LPN.

## 2022-12-23 NOTE — Telephone Encounter (Signed)
    Chief Complaint: States labetalol is causing dizziness and heart palpitations. Wants to go back on metoprolol. Has called cardiology about this. States "they are calling me back, I have to go."  Symptoms: Above Frequency:  Pertinent Negatives: Patient denies chest pain Disposition: [] ED /[] Urgent Care (no appt availability in office) / [] Appointment(In office/virtual)/ []  [facility] [facility]/ [] Home Care/ [] Refused Recommended Disposition /[] [place] [facility]/ [x]  Follow-up with PCP Additional Notes: Pt. Disconnected call.  Answer Assessment - Initial Assessment Questions 1. NAME of MEDICINE: "What medicine(s) are you calling about?"     Labetalol  2. QUESTION: "What is your question?" (e.g., double dose of medicine, side effect)     Cardiology started me on this but it is making me dizzy, heart palpitations 3. PRESCRIBER: "Who prescribed the medicine?" Reason: if prescribed by specialist, call should be referred to that group.     Cardiology 4. SYMPTOMS: "Do you have any symptoms?" If Yes, ask: "What symptoms are you having?"  "How bad are the symptoms (e.g., mild, moderate, severe)     Dizziness, palpitations  5. PREGNANCY:  "Is there any chance that you are pregnant?" "When was your last menstrual period?"     No  Protocols used: Medication Question Call-A-AH

## 2022-12-23 NOTE — Telephone Encounter (Signed)
Pt c/o medication issue:  1. Name of Medication:  labetalol (NORMODYNE) 100 MG tablet  2. How are you currently taking this medication (dosage and times per day)?   3. Are you having a reaction (difficulty breathing--STAT)?   4. What is your medication issue?   Patient states she has been having issue with this medication and she no longer wants to take it. Her throat and tongue have been burning and she would prefer to discuss further with the nurse.

## 2022-12-24 ENCOUNTER — Other Ambulatory Visit: Payer: Self-pay

## 2022-12-24 MED ORDER — BRIMONIDINE TARTRATE-TIMOLOL 0.2-0.5 % OP SOLN
1.0000 [drp] | Freq: Two times a day (BID) | OPHTHALMIC | 3 refills | Status: DC
  Filled 2022-12-24: qty 15, 75d supply, fill #0

## 2022-12-24 MED ORDER — LUMIGAN 0.01 % OP SOLN
1.0000 [drp] | Freq: Every day | OPHTHALMIC | 3 refills | Status: DC
  Filled 2022-12-24: qty 7.5, 75d supply, fill #0
  Filled 2022-12-24 – 2023-01-09 (×2): qty 7.5, 150d supply, fill #0

## 2022-12-25 ENCOUNTER — Other Ambulatory Visit: Payer: Self-pay

## 2022-12-29 ENCOUNTER — Ambulatory Visit (HOSPITAL_BASED_OUTPATIENT_CLINIC_OR_DEPARTMENT_OTHER): Payer: Medicare Other | Admitting: Cardiovascular Disease

## 2022-12-31 ENCOUNTER — Other Ambulatory Visit: Payer: Self-pay

## 2023-01-09 ENCOUNTER — Other Ambulatory Visit: Payer: Self-pay

## 2023-02-20 IMAGING — MG MM DIGITAL SCREENING BILAT W/ TOMO AND CAD
8 series · 9 of 24 positions shown · non-contrast
Comparison: None.

CLINICAL DATA: Screening.

EXAM:
DIGITAL SCREENING BILATERAL MAMMOGRAM WITH TOMOSYNTHESIS AND CAD
TECHNIQUE: Bilateral screening digital craniocaudal and mediolateral oblique
mammograms were obtained. Bilateral screening digital breast
tomosynthesis was performed. The images were evaluated with
computer-aided detection.

[L CC synth-2D]
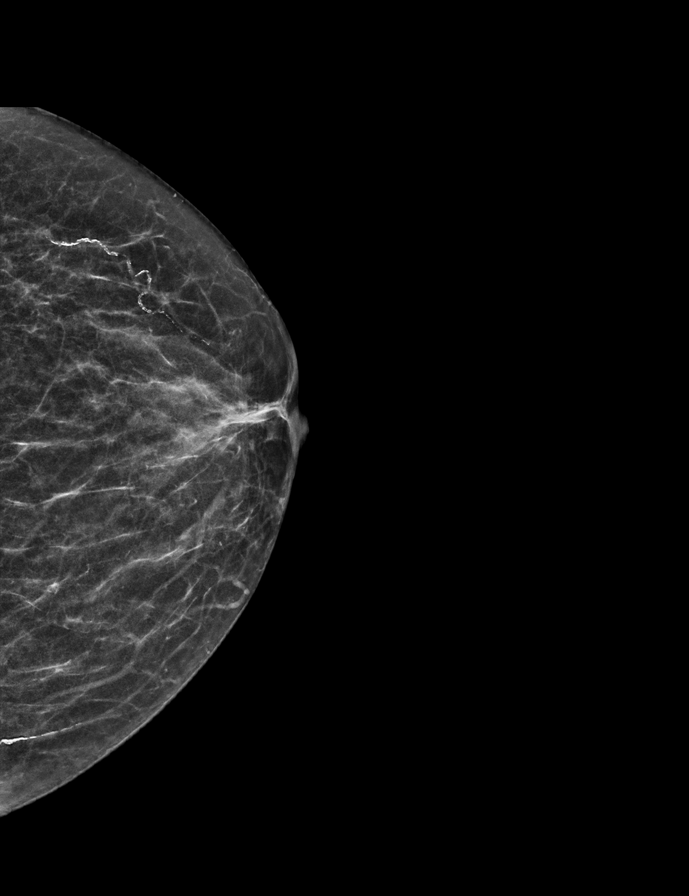

[L MLO synth-2D]
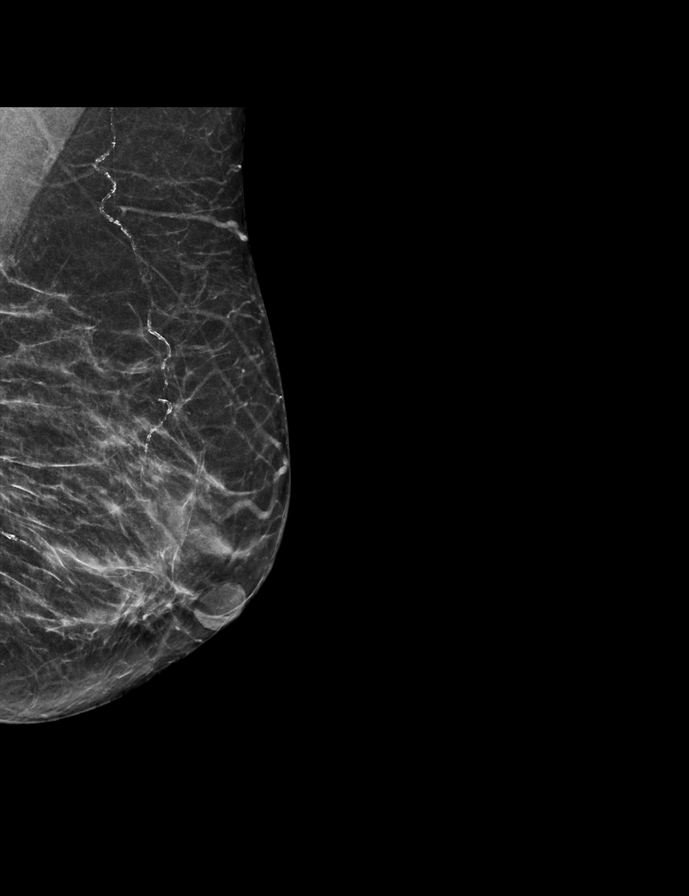

[R MLO synth-2D]
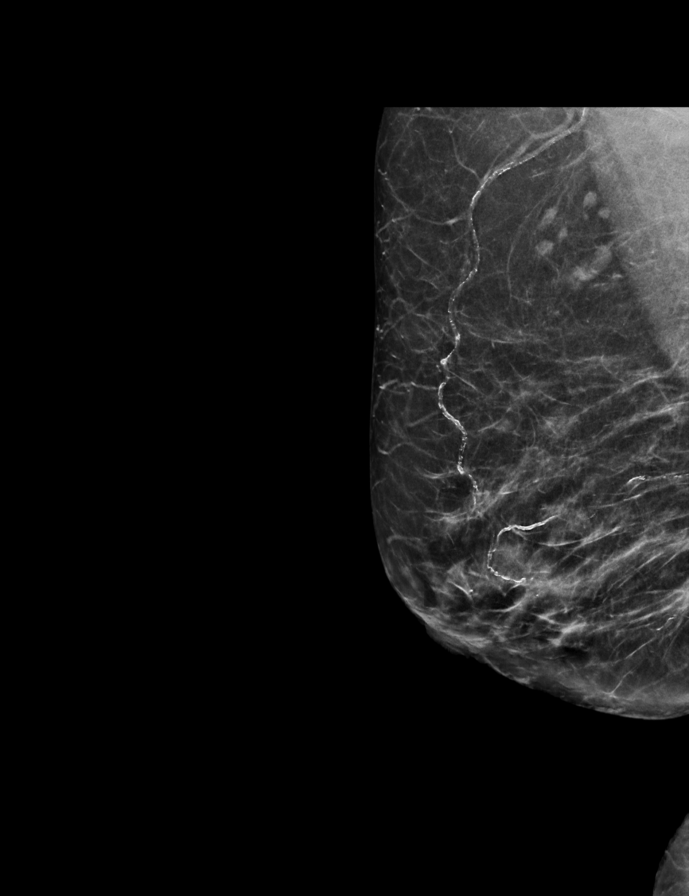

[R CC synth-2D]
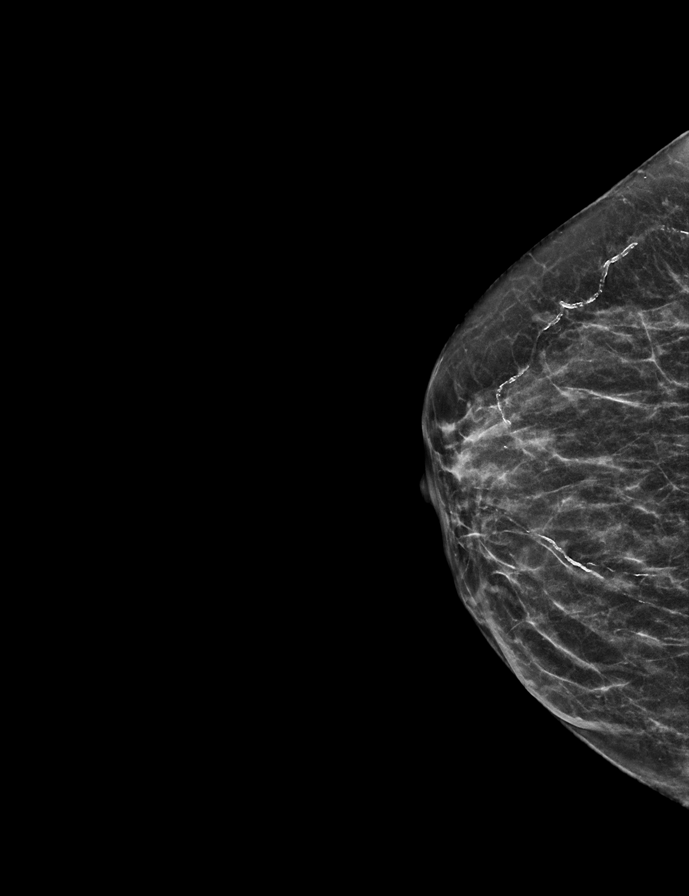

[L MLO tomo · 2 of 52 frames shown]
[frame 17/52]
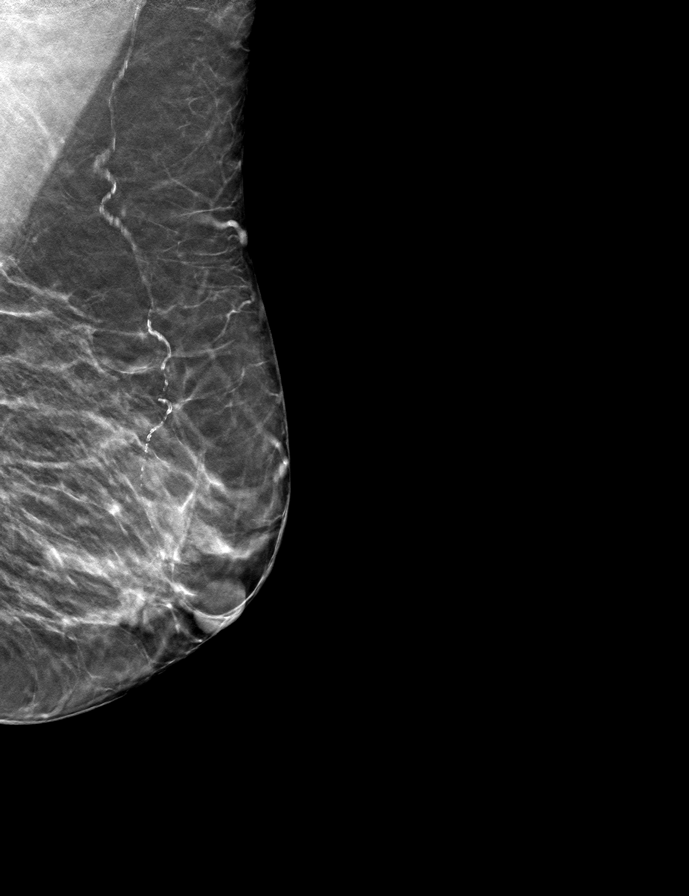
[frame 27/52]
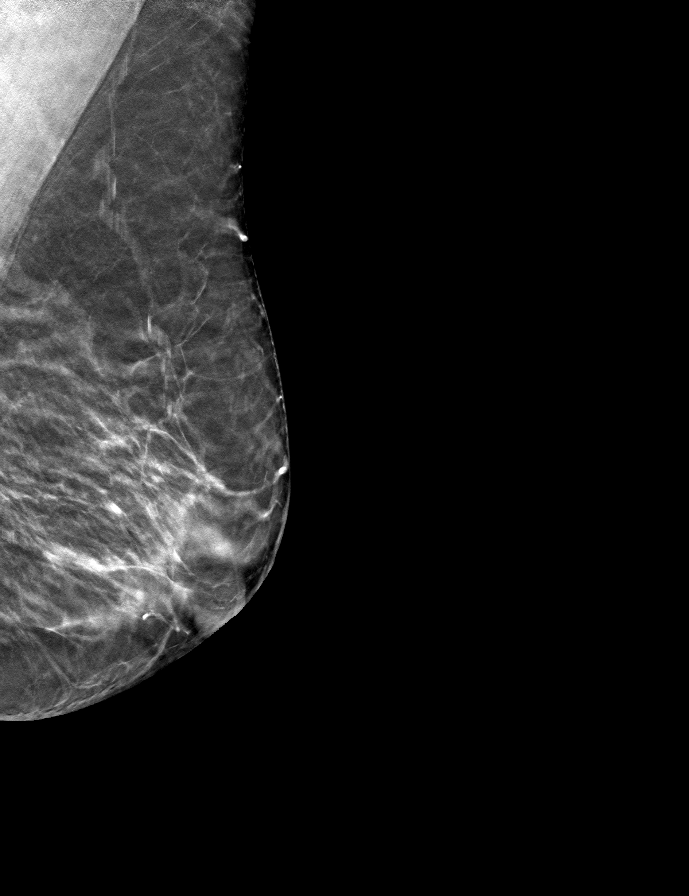

[L CC tomo · tomo slice 22/43.0]
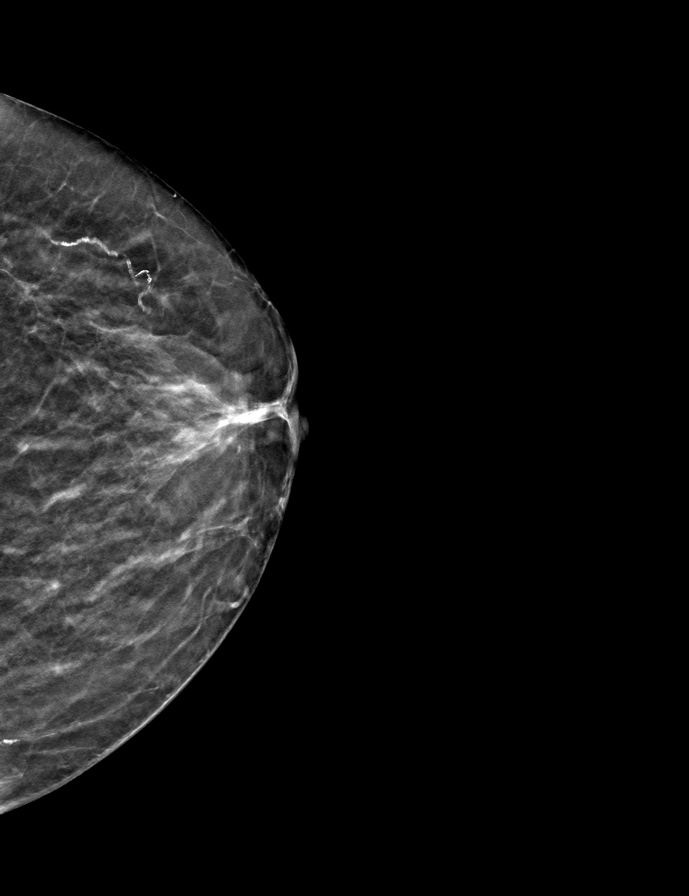

[R CC tomo · tomo slice 23/44.0]
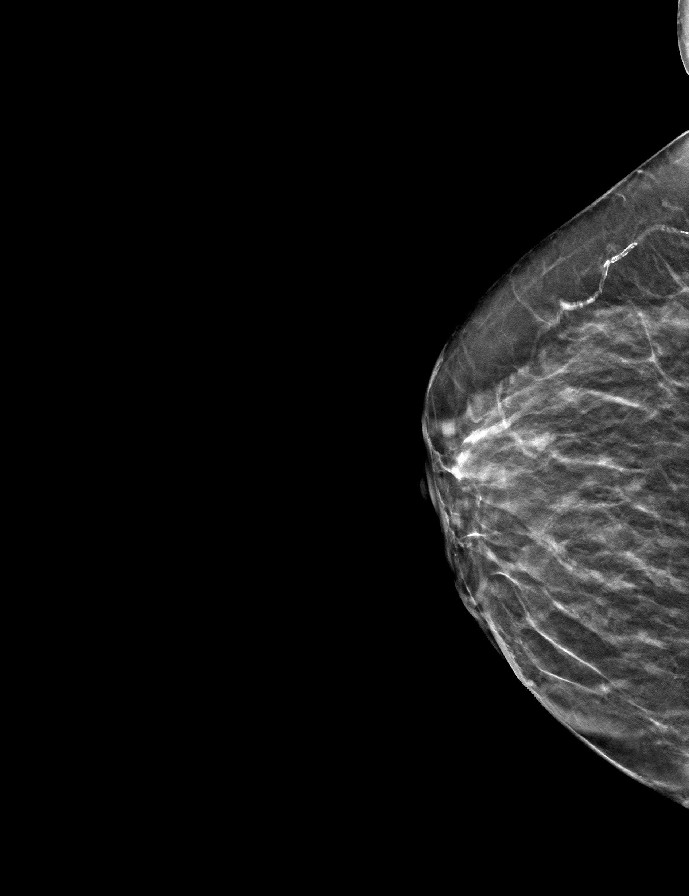

[R MLO tomo · tomo slice 29/58.0]
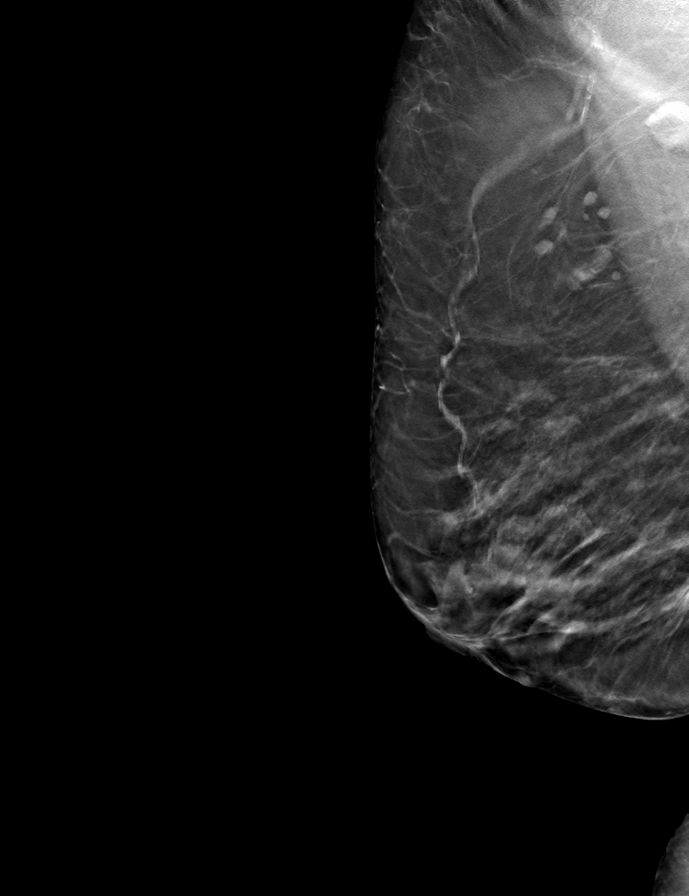

[9 of 24 positions shown; findings below may reference images not displayed]

ACR Breast Density Category b: There are scattered areas of
fibroglandular density.
FINDINGS: There are no findings suspicious for malignancy.
IMPRESSION: No mammographic evidence of malignancy. A result letter of this
screening mammogram will be mailed directly to the patient.

RECOMMENDATION:
Screening mammogram in one year. (Code:[id number]-X-X7B)

BI-RADS CATEGORY  1: Negative.

## 2023-03-20 ENCOUNTER — Other Ambulatory Visit: Payer: Self-pay | Admitting: Internal Medicine

## 2023-08-25 ENCOUNTER — Telehealth (HOSPITAL_BASED_OUTPATIENT_CLINIC_OR_DEPARTMENT_OTHER): Payer: Self-pay | Admitting: Family

## 2023-08-25 NOTE — Telephone Encounter (Signed)
08/25/23 spoke with her daughter who said she is out of town until March and she said she gave her the message and pt will call us when she gets back in town.  08/18/23 MyChart message sent.  08/11/23 LVM with daughter to callback. Yo  08/05/23 & 08/03/23 attempted to contact pt "call can't be completed at this time". yo

## 2023-08-25 NOTE — Telephone Encounter (Signed)
-----   Message from [place] sent at 07/30/2023 10:27 AM EST ----- Regarding: FW: overdue HTN f/u  ----- Message ----- From: Alver Sorrow, NP Sent: 07/30/2023   9:19 AM EST To: [facility] Scheduling Subject: overdue HTN f/u                                Please assist to schedule overdue [facility] f/u with Dr. Duke Salvia, myself, or PharmD. She cancelled her 12/2022 appt and has not rescheduled. TY!  Alver Sorrow, NP

## 2023-10-23 ENCOUNTER — Other Ambulatory Visit: Payer: Self-pay

## 2023-10-23 MED ORDER — COMBIGAN 0.2-0.5 % OP SOLN
1.0000 [drp] | Freq: Two times a day (BID) | OPHTHALMIC | 3 refills | Status: AC
  Filled 2023-10-23: qty 5, 25d supply, fill #0

## 2023-10-23 MED ORDER — LUMIGAN 0.01 % OP SOLN
1.0000 [drp] | Freq: Every evening | OPHTHALMIC | 3 refills | Status: AC
  Filled 2023-10-23: qty 2.5, 50d supply, fill #0

## 2023-10-26 ENCOUNTER — Encounter (HOSPITAL_BASED_OUTPATIENT_CLINIC_OR_DEPARTMENT_OTHER): Payer: Self-pay

## 2023-10-26 ENCOUNTER — Other Ambulatory Visit: Payer: Self-pay

## 2023-10-27 ENCOUNTER — Ambulatory Visit (HOSPITAL_BASED_OUTPATIENT_CLINIC_OR_DEPARTMENT_OTHER): Admitting: Cardiovascular Disease

## 2023-10-27 ENCOUNTER — Encounter (HOSPITAL_BASED_OUTPATIENT_CLINIC_OR_DEPARTMENT_OTHER): Payer: Self-pay | Admitting: Cardiovascular Disease

## 2023-10-27 VITALS — BP 192/104 | HR 88 | Ht 64.0 in | Wt 168.2 lb

## 2023-10-27 DIAGNOSIS — F4321 Adjustment disorder with depressed mood: Secondary | ICD-10-CM | POA: Diagnosis not present

## 2023-10-27 DIAGNOSIS — I1A0 Resistant hypertension: Secondary | ICD-10-CM | POA: Diagnosis not present

## 2023-10-27 MED ORDER — ESCITALOPRAM OXALATE 5 MG PO TABS: 5.0000 mg | ORAL_TABLET | Freq: Every day | ORAL | 1 refills | Status: DC

## 2023-10-27 NOTE — Patient Instructions (Addendum)
 Medication Instructions:  START LEXAPRO 5 MG DAILY  ADDITIONAL  REFILLS WILL NEED TO COME FROM YOUR PRIMARY CARE   Labwork: NONE  Testing/Procedures: NONE  Follow-Up: 3 MONTHS WITH DR Hodgeman   You have been referred to [facility] TO DISCUSS GRIEF COUNSELING   If you need a refill on your cardiac medications before your next appointment, please call your pharmacy.

## 2023-10-27 NOTE — Progress Notes (Signed)
 [facility] Follow-up:   Date:  10/27/2023   ID:  Theresa [name], DOB 11-Jun-1946, MRN [id number]  PCP:  Marcine Matar, MD  Cardiologist:  None  Electrophysiologist:  None   Evaluation Performed:  Follow-Up Visit  Chief Complaint:  Hypertension.  History of Present Illness:    Theresa [name] is a 78 y.o. female with a hx of hypertension, hyperlipidemia, palpitations, ITP, anxiety, here for follow-up. She was initially seen 06/26/2022 in the [facility]. She saw Dr. Laural Benes 11/2021 and her BP in the office was 170/90 on enalapril and metoprolol. She reported that her home blood pressures were controlled. Dr. Laural Benes increased her enalapril but she reduced it back due to palpitations. She was started on a clonidine patch and referred to the [facility].  Her BP was uncontrolled both at home (145-170 systolic) and in the office (166/82), with superimposed white coat hypertension. She has struggled with intolerances to multiple medications. We added 1.25 mg indapamide daily. Renal artery dopplers were repeated 06/2022 showing 1-59% stenosis of the right renal artery, and no evidence of stenosis in the left. On 06/30/2022 she reported swelling of her tongue and sore throat which she attributed to indapamide so it was discontinued. Her blood pressures remained above goal 07/2022 so 2 mg doxazosin was added. She was concerned that doxazosin was causing palpitations and mouth swelling. She was advised to try holding doxazosin for 1 week. Her symptoms improved so she did not resume doxazosin.  We had a telephone visit 08/2022. She reported home blood pressures averaging 150s-160s in the setting of multiple life stressors. We asked our care guide to reach out to her for stress management. She was referred for renal denervation which was performed 11/19/2022 with Dr. Kirke Corin and was successful in the bilateral renal arteries.  At her visit 11/2022 BP was  204/96.  BP at home averaged 140-160s on metoprolol and enalapril.  She was struggling with stress.    Discussed the use of AI scribe software for clinical note transcription with the patient, who gave verbal consent to proceed.  History of Present Illness  Blood pressure readings at home have been variable, ranging from 130s to 170s systolic and 70s to 90s diastolic, with an average of 150s/80s. These readings are slightly better than before her procedure, although not significantly different. She has been on various medications, which have been adjusted over time. Current medications include metoprolol.  She experiences anxiety, which she believes contributes to her elevated blood pressure. She describes feeling more anxious during certain times, which correlates with higher blood pressure readings. She has not engaged in professional counseling but acknowledges the potential benefit of grief counseling, especially following the loss of a loved one who had a significant role in her life.  Her physical activity has been limited recently, primarily due to her granddaughter's late return home, which affects her ability to go for walks. She has a static bike at home but finds it challenging to use without assistance. Despite these limitations, she has managed to lose weight, dropping from 177 pounds to 166 pounds, which she attributes to some exercise and healthy eating. No chest pain or pressure while walking and no swelling in her legs or feet unless traveling.  Previous antihypertensives: Clonidine patch (palpitations) Amlodipine (swelling) Candesartan (palpitations) Carvedilol (excessive drowsiness, dizziness and palpitations) Chlorthalidone (palpitations, dizziness) PO clonidine (dizziness)  HCTZ (dizziness, palpitations)  Hydralazine (palpitations) lisinopril (palpitatons) Lisinopril-HCTZ (flushing and insomnia) Spironolactone (dizziness, palpitations) Valsartan (palpitations)  Indapamide  (sore throat, tongue swelling) Doxazosin (palpitations, mouth lesions/swelling)  Past Medical History:  Diagnosis Date   Anxiety 12/17/2022   Gastric ulcer    Hyperlipidemia    Hypertension    River blindness    Sinusitis     Past Surgical History:  Procedure Laterality Date   APPENDECTOMY     BREAST LUMPECTOMY     CATARACT EXTRACTION Left 01/2020   RENAL DENERVATION N/A 11/19/2022   Procedure: RENAL DENERVATION;  Surgeon: Iran Ouch, MD;  Location: [facility];  Service: Cardiovascular;  Laterality: N/A;    Current Medications: Current Meds  Medication Sig   acetaminophen (TYLENOL) 500 MG tablet Take 500 mg by mouth every 6 (six) hours as needed for mild pain or moderate pain.   ascorbic acid (VITAMIN C) 500 MG tablet Take 500 mg by mouth 2 (two) times daily.   bimatoprost (LUMIGAN) 0.01 % SOLN Instill 1 drop into both eyes every night   brimonidine-timolol (COMBIGAN) 0.2-0.5 % ophthalmic solution Instill 1 drop into both eyes twice a day   enalapril (VASOTEC) 20 MG tablet Take 1 tablet (20 mg total) by mouth 2 (two) times daily.   escitalopram (LEXAPRO) 5 MG tablet Take 1 tablet (5 mg total) by mouth daily.   folic acid (FOLVITE) 400 MCG tablet Take 400 mcg by mouth daily.   metoprolol tartrate (LOPRESSOR) 25 MG tablet Take 1 tablet (25 mg total) by mouth 2 (two) times daily.   Multiple Vitamin (MULTIVITAMIN) tablet Take 1 tablet by mouth 2 (two) times a week.     Allergies:   Amlodipine, Coreg [carvedilol], Shellfish allergy, Trazodone and nefazodone, Hydralazine hcl, Augmentin [amoxicillin-pot clavulanate], Cardura [doxazosin], Indapamide, Labetalol, Clonidine derivatives, Lisinopril, and Lisinopril-hydrochlorothiazide   Social History   Socioeconomic History   Marital status: Widowed    Spouse name: Not on file   Number of children: 2   Years of education: Not on file   Highest education level: Not on file  Occupational History   Not on file  Tobacco  Use   Smoking status: Never   Smokeless tobacco: Never  Vaping Use   Vaping status: Never Used  Substance and Sexual Activity   Alcohol use: No   Drug use: No   Sexual activity: Not Currently    Birth control/protection: Post-menopausal  Other Topics Concern   Not on file  Social History Narrative   Not on file   Social Drivers of Health   Financial Resource Strain: Low Risk  (09/11/2022)   Overall Financial Resource Strain (CARDIA)    Difficulty of Paying Living Expenses: Not hard at all  Food Insecurity: No Food Insecurity (09/11/2022)   Hunger Vital Sign    Worried About Running Out of Food in the Last Year: Never true    Ran Out of Food in the Last Year: Never true  Transportation Needs: No Transportation Needs (09/11/2022)   PRAPARE - Administrator, Civil Service (Medical): No    Lack of Transportation (Non-Medical): No  Physical Activity: Inactive (09/11/2022)   Exercise Vital Sign    Days of Exercise per Week: 0 days    Minutes of Exercise per Session: 0 min  Stress: No Stress Concern Present (09/11/2022)   [employer] of Occupational Health - Occupational Stress Questionnaire    Feeling of Stress : Not at all  Social Connections: Not on file     Family History: The patient's family history includes Cancer in her brother; Hypertension in her  mother.  ROS:   Please see the history of present illness.    (+) Headache (+) Tenseness of shoulders (+) Stress All other systems reviewed and are negative.  EKGs/Labs/Other Studies Reviewed:    Renal Denervation  11/19/2022: 1. 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 1 extra accessory artery on each side were not treated due to vessel diameter less than 3 mm.   2.  Difficult procedure overall due to unusual anterior takeoff of the right renal artery with difficult engagement as well as significant tortuosity of the renal arteries.    Recommendations: Due to excessive contrast use and length of procedure, will observe overnight and hydrate to decrease the chance of contrast-induced nephropathy.  Bilateral Renal Artery Dopplers  07/23/2022: Summary:  Renal:    Right: Normal size right kidney. RRV flow present. 1-59% stenosis of         the right renal artery. Normal cortical thickness of right         kidney. Abnormal right Resistive Index.  Left:  Normal size of left kidney. LRV flow present. No evidence of         left renal artery stenosis. Normal cortical thickness of the         left kidney. Abnormal left Resisitve Index.   Bilateral Renal Artery Dopplers  11/01/2015: Summary:  Findings suggest 1-59% renal artery stenosis. There is evidence of  elevated resistive indices in bilateral intrarenal arteries.  Incidental finding: there is evidence of multiple simple cysts in  the left kidney, largest measuring 1.14cm.   EKG:  EKG is personally reviewed. 12/17/2022:  EKG was not ordered. 09/10/2022:  EKG was not ordered. 06/26/2022: Sinus rhythm. Rate 72 bpm. LAFB.  Recent Labs: 11/20/2022: BUN 12; Creatinine, Ser 0.92; Hemoglobin 10.2; Platelets 74; Potassium 3.7; Sodium 140   Recent Lipid Panel    Component Value Date/Time   CHOL 231 (H) 11/20/2022 0204   CHOL 263 (H) 06/06/2021 0935   TRIG 47 11/20/2022 0204   HDL 62 11/20/2022 0204   HDL 82 06/06/2021 0935   CHOLHDL 3.7 11/20/2022 0204   VLDL 9 11/20/2022 0204   LDLCALC 160 (H) 11/20/2022 0204   LDLCALC 172 (H) 06/06/2021 0935    Physical Exam:    VS:  BP (!) 192/104   Pulse 88   Ht 5\' 4"  (1.626 m)   Wt 168 lb 3.2 oz (76.3 kg)   SpO2 100%   BMI 28.87 kg/m  , BMI Body mass index is 28.87 kg/m. GENERAL:  Well appearing HEENT: Pupils equal round and reactive, fundi not visualized, oral mucosa unremarkable NECK:  No jugular venous distention, waveform within normal limits, carotid upstroke brisk and symmetric, no bruits, no thyromegal LUNGS:   Clear to auscultation bilaterally HEART:  RRR.  PMI not displaced or sustained,S1 and S2 within normal limits, no S3, no S4, no clicks, no rubs, no murmurs ABD:  Flat, positive bowel sounds normal in frequency in pitch, no bruits, no rebound, no guarding, no midline pulsatile mass, no hepatomegaly, no splenomegaly EXT:  2 plus pulses throughout, no edema, no cyanosis no clubbing SKIN:  No rashes no nodules NEURO:  Cranial nerves II through XII grossly intact, motor grossly intact throughout PSYCH:  Cognitively intact, oriented to person place and time  ASSESSMENT/PLAN:    Assessment & Plan # Hypertension BP very elevated in the office but not this high at home.  Hypertension with systolic readings ranging from 130s to 170s and  diastolic readings from 80s to 11B, averaging around 150s/80s. Slight improvement post-procedure, but not significantly. Anxiety contributes to elevated levels. Current medications are ineffective in controlling blood pressure. Regular exercise and dietary modifications are recommended, with potential improvement of 8-10 points from consistent exercise. - Encourage regular exercise, including using a static bike with assistance to prevent falls. - Monitor blood pressure at home on Monday, Wednesday, and Friday. - Maintain current antihypertensive regimen.  # Anxiety Chronic anxiety contributing to elevated blood pressure. Experiences increased anxiety during stress, correlating with higher blood pressure. Previous use of Xanax for procedural anxiety, but not suitable for chronic management. Lexapro (escitalopram) is recommended for chronic anxiety, with an expected onset of effect in about six weeks. - Initiate Lexapro (escitalopram) at 5 mg for anxiety management. - Refer to grief counseling to address anxiety related to recent bereavement. - Reassess in two months to evaluate Lexapro's effectiveness on anxiety and blood pressure.  # Weight management Weight decreased  from 177 lbs to 166 lbs due to increased physical activity and healthy eating. Continued weight management is important for overall health and blood pressure control. - Encourage continuation of healthy eating habits and regular physical activity.   Screening for Secondary Hypertension:     06/26/2022    2:42 PM  Causes  Drugs/Herbals Screened     - Comments limiting white salt but uses Himalayan salt, no EtOH  Renovascular HTN Screened     - Comments Mild renal artery steonsis.    Relevant Labs/Studies:    Latest Ref Rng & Units 11/20/2022    2:04 AM 11/18/2022    8:55 AM 11/18/2022   12:00 AM  Basic Labs  Sodium 135 - 145 mmol/L 140  CANCELED  142   Potassium 3.5 - 5.1 mmol/L 3.7  CANCELED  4.7   Creatinine 0.44 - 1.00 mg/dL 1.47  CANCELED  8.29        Latest Ref Rng & Units 07/04/2022    8:10 AM 08/30/2019    8:50 AM  Thyroid   TSH 0.450 - 4.500 uIU/mL 3.720  2.880        Latest Ref Rng & Units 11/26/2015    4:55 PM  Renin/Aldosterone   Aldosterone ng/dL 3              56/21/3086   12:22 PM  Renovascular   Renal Artery [place] Completed Yes    Disposition:    FU 3 months.  Medication Adjustments/Labs and Tests Ordered: Current medicines are reviewed at length with the patient today.  Concerns regarding medicines are outlined above.   Orders Placed This Encounter  Procedures   Referral to HRT/VAS Care Navigation   EKG 12-Lead   Meds ordered this encounter  Medications   escitalopram (LEXAPRO) 5 MG tablet    Sig: Take 1 tablet (5 mg total) by mouth daily.    Dispense:  90 tablet    Refill:  1    Signed, Chilton Si, MD  10/27/2023 6:52 PM    [facility]

## 2023-10-28 ENCOUNTER — Telehealth: Payer: Self-pay | Admitting: Licensed Clinical Social Worker

## 2023-10-28 ENCOUNTER — Other Ambulatory Visit: Payer: Self-pay | Admitting: Internal Medicine

## 2023-10-28 NOTE — Progress Notes (Signed)
 Heart and Vascular Care Navigation  10/28/2023  Theresa [name] Apr 15, 1946 [id number]  Reason for Referral: grief support resources Patient is participating in a Managed Medicaid Plan: No, Medicare part B and Medicaid  Engaged with patient by telephone for initial visit for Heart and Vascular Care Coordination.                                                                                                   Assessment:     LCSW spoke with pt today at [phone] after initial call. Pt again confirmed new home address. Moved in with her daughter and grandchildren. Introduced self, role, reason for call. Pt denies any issues obtaining or affording food, medications or any additional costs of living- states family pays for it all. She does receive SSI and relies on her daughter for transportation which can be tricky since they  works during the day. Pt shares that she has had multiple losses and transitions which have been stressful for her including immigrating in 2016 and now transitioning back to her daughters home. It is often difficult for me to full understand patient but with a few clarifying questions it sounds like she mostly relies on pastoral support through her faith community and family at this time. I encouraged her to consider [facility] grief counseling services or touching base with another local referral source such as [facility] to connect with services that may understand more deeply the experiences she has had. There is no timeline for engaging with those resources. Pt agreeable to me sending these resources to her home- I am not sure if she will engage with them but is agreeable to me reaching back out to her to provide additional support or context. Pt aware these services may be able to also be provided telephonically should she ask.   No additional questions at this time, pt appreciative of call and f/u.                                      HRT/VAS Care  Coordination     Patients Home Cardiology Office --  [facility]   Outpatient Care Team Social Worker   Social Worker Name: Octavio Graves, [place], [phone]   Living arrangements for the past 2 months Single Family Home   Lives with: Adult Children; Relatives   Patient Current Insurance Coverage Traditional Medicare; Medicaid  Medicare Part B and Medicaid   Patient Has Concern With Paying Medical Bills No   Does Patient Have Prescription Coverage? Yes       Social History:                                                                             SDOH  Screenings   Food Insecurity: No Food Insecurity (10/28/2023)  Housing: Low Risk  (10/28/2023)  Transportation Needs: Unmet Transportation Needs (10/28/2023)  Utilities: Not At Risk (10/28/2023)  Depression (PHQ2-9): Low Risk  (09/11/2022)  Financial Resource Strain: Low Risk  (10/28/2023)  Physical Activity: Inactive (09/11/2022)  Stress: Stress Concern Present (10/28/2023)  Tobacco Use: Low Risk  (10/27/2023)  Health Literacy: Inadequate Health Literacy (10/28/2023)    SDOH Interventions: Financial Resources:  Financial Strain Interventions: Intervention Not Indicated  Food Insecurity:  Food Insecurity Interventions: Intervention Not Indicated  Housing Insecurity:  Housing Interventions: Intervention Not Indicated  Transportation:   Transportation Interventions: Patient Resources (Friends/Family), Community Resources Provided    Other Care Navigation Interventions:     Provided Pharmacy assistance resources  Pt denies any issues obtaining or affording medications at this time  Patient expressed Mental Health concerns Yes, Referred to:  community mental health resources and grief specific counseling   Follow-up plan:   LCSW mailed pt the following: [facility] grief counseling resources, community mental health resources, immigrant services through [employer] and information about transportation options in [place]. Will f/u next  week to ensure resources received and answer any additional questions/concerns.

## 2023-10-28 NOTE — Telephone Encounter (Signed)
 H&V Care Navigation CSW Progress Note  Clinical Social Worker contacted patient by phone to f/u on referral for resources for grief support. Was able to reach pt this morning at [phone]. Introduced self, role, reason for call. Have spoken with pt in past. She has moved in with her daughter and grandchildren at [address] [place], [address]. Updated this address on chart. She is waiting needed time to update address with Medicare and Medicaid. I discussed what I was calling about briefly and she asks to call me back in a hour once everyone at the house is up and on their way to work/school for the day.  Patient is participating in a Managed Medicaid Plan:  No, Medicare Pt B and Medicaid.  SDOH Screenings   Food Insecurity: No Food Insecurity (09/11/2022)  Transportation Needs: No Transportation Needs (09/11/2022)  Depression (PHQ2-9): Low Risk  (09/11/2022)  Financial Resource Strain: Low Risk  (09/11/2022)  Physical Activity: Inactive (09/11/2022)  Stress: No Stress Concern Present (09/11/2022)  Tobacco Use: Low Risk  (10/27/2023)    Octavio Graves, MSW, LCSW Clinical Social Worker II [facility] Heart/Vascular Care Navigation  [phone]- work cell phone (preferred) [phone]- desk phone

## 2023-11-04 ENCOUNTER — Telehealth: Payer: Self-pay | Admitting: Licensed Clinical Social Worker

## 2023-11-04 ENCOUNTER — Other Ambulatory Visit: Payer: Self-pay

## 2023-11-04 NOTE — Telephone Encounter (Signed)
 H&V Care Navigation CSW Progress Note  Clinical Social Worker contacted patient by phone to f/u on community resources for grief counseling and transportation. Pt did not answer at [phone]. But then returned my voicemail message, let me know that she is not home. Will f/u with me. If I do not hear back from her will re-attempt again next week.  Patient is participating in a Managed Medicaid Plan:  No, Medicare part B and Medicaid  SDOH Screenings   Food Insecurity: No Food Insecurity (10/28/2023)  Housing: Low Risk  (10/28/2023)  Transportation Needs: Unmet Transportation Needs (10/28/2023)  Utilities: Not At Risk (10/28/2023)  Depression (PHQ2-9): Low Risk  (09/11/2022)  Financial Resource Strain: Low Risk  (10/28/2023)  Physical Activity: Inactive (09/11/2022)  Stress: Stress Concern Present (10/28/2023)  Tobacco Use: Low Risk  (10/27/2023)  Health Literacy: Inadequate Health Literacy (10/28/2023)    Octavio Graves, MSW, LCSW Clinical Social Worker II [facility] Heart/Vascular Care Navigation  [phone]- work cell phone (preferred) [phone]- desk phone

## 2023-11-04 NOTE — Telephone Encounter (Signed)
 H&V Care Navigation CSW Progress Note  Clinical Social Worker  received a call back from pt  to let me know that she has not yet received resources. Will check mail. If she receives it she will call me back otherwise I will check back in Friday. Encouraged to call [facility] when she receives information to discuss grief support services.  Patient is participating in a Managed Medicaid Plan:  no Medicare Part B and Medicaid  SDOH Screenings   Food Insecurity: No Food Insecurity (10/28/2023)  Housing: Low Risk  (10/28/2023)  Transportation Needs: Unmet Transportation Needs (10/28/2023)  Utilities: Not At Risk (10/28/2023)  Depression (PHQ2-9): Low Risk  (09/11/2022)  Financial Resource Strain: Low Risk  (10/28/2023)  Physical Activity: Inactive (09/11/2022)  Stress: Stress Concern Present (10/28/2023)  Tobacco Use: Low Risk  (10/27/2023)  Health Literacy: Inadequate Health Literacy (10/28/2023)    Octavio Graves, MSW, LCSW Clinical Social Worker II [facility] Heart/Vascular Care Navigation  [phone]- work cell phone (preferred) [phone]- desk phone

## 2023-11-06 ENCOUNTER — Telehealth (HOSPITAL_BASED_OUTPATIENT_CLINIC_OR_DEPARTMENT_OTHER): Payer: Self-pay | Admitting: Licensed Clinical Social Worker

## 2023-11-06 NOTE — Telephone Encounter (Signed)
 H&V Care Navigation CSW Progress Note  Clinical Social Worker contacted patient by phone to f/u on text message letting me know she isnt available this morning for a call. Encouraged her to call me back as able- plan on f/u Monday at 10, will call her if I do not hear from her directly.  Patient is participating in a Managed Medicaid Plan:  No, Medicare and supplement  SDOH Screenings   Food Insecurity: No Food Insecurity (10/28/2023)  Housing: Low Risk  (10/28/2023)  Transportation Needs: Unmet Transportation Needs (10/28/2023)  Utilities: Not At Risk (10/28/2023)  Depression (PHQ2-9): Low Risk  (09/11/2022)  Financial Resource Strain: Low Risk  (10/28/2023)  Physical Activity: Inactive (09/11/2022)  Stress: Stress Concern Present (10/28/2023)  Tobacco Use: Low Risk  (10/27/2023)  Health Literacy: Inadequate Health Literacy (10/28/2023)    Octavio Graves, MSW, LCSW Clinical Social Worker II [facility] Heart/Vascular Care Navigation  [phone]- work cell phone (preferred) [phone]- desk phone

## 2023-11-09 ENCOUNTER — Telehealth: Payer: Self-pay | Admitting: Licensed Clinical Social Worker

## 2023-11-09 NOTE — Telephone Encounter (Signed)
 H&V Care Navigation CSW Progress Note  Clinical Social Worker contacted patient by phone to f/u on resources. Was able to leave a voicemail that was returned by pt to let me know that she doesn't think she received resources yet but would check the mail. Provided her [facility] number directly as she has a pen and paper today and encouraged her to call. Pt also will check mail when it comes and let me know if I need to mail it again.  Patient is participating in a Managed Medicaid Plan:  No, Medicare and supplement   SDOH Screenings   Food Insecurity: No Food Insecurity (10/28/2023)  Housing: Low Risk  (10/28/2023)  Transportation Needs: Unmet Transportation Needs (10/28/2023)  Utilities: Not At Risk (10/28/2023)  Depression (PHQ2-9): Low Risk  (09/11/2022)  Financial Resource Strain: Low Risk  (10/28/2023)  Physical Activity: Inactive (09/11/2022)  Stress: Stress Concern Present (10/28/2023)  Tobacco Use: Low Risk  (10/27/2023)  Health Literacy: Inadequate Health Literacy (10/28/2023)    Nathen Balder, MSW, LCSW Clinical Social Worker II [facility] Heart/Vascular Care Navigation  [phone]- work cell phone (preferred) [phone]- desk phone

## 2023-11-19 ENCOUNTER — Telehealth: Payer: Self-pay | Admitting: Licensed Clinical Social Worker

## 2023-11-19 NOTE — Telephone Encounter (Signed)
 H&V Care Navigation CSW Progress Note  Clinical Social Worker contacted patient by phone to f/u on resources. Was able to reach her this morning at [phone]. She confirms she received resources sent and feels that at this time she has been supported well through her visits at [facility] and calls to check in. She declines any other needs at this time. Encouraged her to call our team as needed in the future should any additional resources be sought.  Patient is participating in a Managed Medicaid Plan: No, Medicare pt B and Medicaid  SDOH Screenings   Food Insecurity: No Food Insecurity (10/28/2023)  Housing: Low Risk  (10/28/2023)  Transportation Needs: Unmet Transportation Needs (10/28/2023)  Utilities: Not At Risk (10/28/2023)  Depression (PHQ2-9): Low Risk  (09/11/2022)  Financial Resource Strain: Low Risk  (10/28/2023)  Physical Activity: Inactive (09/11/2022)  Stress: Stress Concern Present (10/28/2023)  Tobacco Use: Low Risk  (10/27/2023)  Health Literacy: Inadequate Health Literacy (10/28/2023)    Nathen Balder, MSW, LCSW Clinical Social Worker II [facility] Heart/Vascular Care Navigation  [phone]- work cell phone (preferred) [phone]- desk phone

## 2023-12-03 ENCOUNTER — Encounter (HOSPITAL_COMMUNITY): Payer: Self-pay

## 2023-12-10 ENCOUNTER — Encounter: Payer: Self-pay | Admitting: Internal Medicine

## 2023-12-10 ENCOUNTER — Ambulatory Visit: Attending: Internal Medicine | Admitting: Internal Medicine

## 2023-12-10 VITALS — BP 152/72 | HR 79 | Resp 19 | Ht 64.0 in | Wt 172.0 lb

## 2023-12-10 DIAGNOSIS — F419 Anxiety disorder, unspecified: Secondary | ICD-10-CM | POA: Diagnosis not present

## 2023-12-10 DIAGNOSIS — R600 Localized edema: Secondary | ICD-10-CM | POA: Diagnosis not present

## 2023-12-10 DIAGNOSIS — I10 Essential (primary) hypertension: Secondary | ICD-10-CM | POA: Insufficient documentation

## 2023-12-10 DIAGNOSIS — Z79899 Other long term (current) drug therapy: Secondary | ICD-10-CM | POA: Insufficient documentation

## 2023-12-10 MED ORDER — METOPROLOL TARTRATE 25 MG PO TABS
25.0000 mg | ORAL_TABLET | Freq: Two times a day (BID) | ORAL | 3 refills | Status: AC
Start: 2023-12-10 — End: ?

## 2023-12-10 MED ORDER — ENALAPRIL MALEATE 20 MG PO TABS
20.0000 mg | ORAL_TABLET | Freq: Two times a day (BID) | ORAL | 2 refills | Status: AC
Start: 2023-12-10 — End: ?

## 2023-12-10 NOTE — Progress Notes (Signed)
 Patient ID: Theresa [name], female    DOB: 1945/08/27  MRN: [id number]  CC: Hypertension   Subjective: Theresa [name] is a 78 y.o. female who presents for chronic ds management. 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 Lee Public.   Anxiety: Loss her son-in-law in 02/2023 and was dealing with anxiety subsequent to his death.  She feels the anxiety was contributing to elevated blood pressure.  Placed on Lexapro  5 mg daily last month by cardiologist Dr. Theodis Fiscal.  She finds the medication helpful.  Not feeling as anxious.  She declines seeing a Veterinary surgeon.  HTN: Currently on metoprolol  25 mg twice a day and should be on enalapril  20 mg twice a day.  She states that the dose was increased from 10 to 20 mg by Dr. Theodis Fiscal.  She is requesting an updated prescription for the 20 mg tablets to be sent to her pharmacy.  Reports blood pressure readings are better.  She checks 2-3 times a week.  She has log with her.  Some of her most recent readings are recent 154/78, 136/92, 159/90, 137/89, 122/72 Limits salt in foods Gets out and walk around outside and also uses stationary bike at home about 4x/wk 20-30 mins No CP/SOB Reports that she is eating and sleeping well. She has not had any falls.  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     ascorbic acid (VITAMIN C) 500 MG tablet Take 500 mg by mouth 2 (two) times daily.     b complex vitamins capsule Take 1 capsule by mouth 2 (two) times a week.     bimatoprost  (LUMIGAN ) 0.01 % SOLN Instill 1 drop into both eyes every night 7.5 mL 3   bimatoprost  (LUMIGAN ) 0.01 % SOLN Place 1 drop into both eyes at bedtime. 7.5 mL 3   bimatoprost  (LUMIGAN ) 0.01 % SOLN Place 1 drop into both eyes at bedtime. 7.5 mL 3   brimonidine -timolol  (COMBIGAN ) 0.2-0.5 % ophthalmic solution Instill 1 drop into both eyes twice a day 45 mL 3   brimonidine -timolol  (COMBIGAN ) 0.2-0.5 % ophthalmic solution Instill 1 drop into both eyes twice a day 15 mL 3   Cholecalciferol (VITAMIN D3 PO) Take 1 tablet by mouth 3 (three) times a week.     COMBIGAN  0.2-0.5 % ophthalmic solution Place 1 drop into both eyes 2 (two) times daily. 15 mL 3   escitalopram  (LEXAPRO ) 5 MG tablet Take 1 tablet (5 mg total) by mouth daily. 90 tablet 1   folic acid  (FOLVITE ) 400 MCG tablet Take 400 mcg by mouth daily.     Multiple Vitamin (MULTIVITAMIN) tablet Take 1 tablet by mouth  2 (two) times a week.     Multiple Vitamins-Minerals (ZINC PO) Take 1 tablet by mouth 3 (three) times a week.     Polyvinyl Alcohol -Povidone PF (REFRESH) 1.4-0.6 % SOLN Place 1 drop into both eyes at bedtime.     No current facility-administered medications on file prior to visit.    Allergies  Allergen Reactions   Amlodipine  Swelling   Coreg  [Carvedilol ] Other (See Comments)    Drowsiness, dizziness, palpitations   Shellfish Allergy Swelling   Trazodone  And Nefazodone Swelling    Possible eye pain and swelling   Hydralazine  Hcl Palpitations   Augmentin [Amoxicillin-Pot Clavulanate]    Cardura  [Doxazosin ]     SWELLING IN MOUTH    Indapamide      Lip swelling and palpitations    Labetalol      Dizzy/lightheaded. Burning throat and tongue    Clonidine  Derivatives Other (See Comments)    "heart came out of chest"   Lisinopril  Palpitations   Lisinopril -Hydrochlorothiazide  Other (See Comments)    Possible flushing, insomnia    Social History   Socioeconomic History   Marital status: Widowed    Spouse name: Not on file   Number of children: 2   Years of education: Not on file   Highest education level: Not on file  Occupational History   Not on file  Tobacco Use   Smoking status: Never   Smokeless tobacco: Never  Vaping Use   Vaping status: Never Used  Substance and Sexual Activity   Alcohol  use: No   Drug use: No   Sexual activity: Not Currently    Birth control/protection: Post-menopausal  Other Topics Concern   Not on file  Social History Narrative   Not on file   Social Drivers of Health   Financial Resource Strain: Low Risk  (12/10/2023)   Overall Financial Resource Strain (CARDIA)    Difficulty of Paying Living Expenses: Not hard at all  Food Insecurity: No Food Insecurity (12/10/2023)   Hunger Vital Sign    Worried About Running Out of Food in the Last Year: Never true    Ran Out of Food in the Last Year: Never true  Transportation Needs: Unmet Transportation Needs (12/10/2023)   PRAPARE - Transportation    Lack of Transportation (Medical): No    Lack of Transportation (Non-Medical): Yes  Physical Activity: Insufficiently Active (12/10/2023)   Exercise Vital Sign    Days of Exercise per Week: 4 days    Minutes of Exercise per Session: 20 min  Stress: Stress Concern Present (12/10/2023)   [employer] of Occupational Health - Occupational Stress Questionnaire    Feeling of Stress : To some extent  Social Connections: Moderately Integrated (12/10/2023)   Social Connection and Isolation Panel [NHANES]    Frequency of Communication with Friends and Family: More than three times a week     Frequency of Social Gatherings with Friends and Family: More than three times a week    Attends Religious Services: 1 to 4 times per year    Active Member of [employer] or Organizations: Yes    Attends Banker Meetings: More than 4 times per year    Marital Status: Widowed  Intimate Partner Violence: Not At Risk (12/10/2023)   Humiliation, Afraid, Rape, and Kick questionnaire    Fear of Current or Ex-Partner: No    Emotionally Abused: No    Physically Abused: No    Sexually Abused: No    Family History  Problem Relation Age of Onset  Hypertension Mother    Cancer Brother     Past Surgical History:  Procedure Laterality Date   APPENDECTOMY     BREAST LUMPECTOMY     CATARACT EXTRACTION Left 01/2020   RENAL DENERVATION N/A 11/19/2022   Procedure: RENAL DENERVATION;  Surgeon: Wenona Hamilton, MD;  Location: [facility];  Service: Cardiovascular;  Laterality: N/A;    ROS: Review of Systems Negative except as stated above  PHYSICAL EXAM: BP (!) 152/72   Pulse 79   Resp 19   Ht 5\' 4"  (1.626 m)   Wt 172 lb (78 kg)   SpO2 100%   BMI 29.52 kg/m   Wt Readings from Last 3 Encounters:  12/10/23 172 lb (78 kg)  10/27/23 168 lb 3.2 oz (76.3 kg)  12/17/22 174 lb 12.8 oz (79.3 kg)    Physical Exam  General appearance - alert, well appearing, pleasant elderly female and in no distress Mental status - normal mood, behavior, speech, dress, motor activity, and thought processes Chest - clear to auscultation, no wheezes, rales or rhonchi, symmetric air entry Heart - normal rate, regular rhythm, normal S1, S2, no murmurs, rubs, clicks or gallops Extremities -trace to 1+ bilateral lower extremity edema      Latest Ref Rng & Units 11/20/2022    2:04 AM 11/18/2022    8:55 AM 11/18/2022   12:00 AM  CMP  Glucose 70 - 99 mg/dL 93  CANCELED  87   BUN 8 - 23 mg/dL 12  CANCELED  15   Creatinine 0.44 - 1.00 mg/dL 1.61  CANCELED  0.96   Sodium 135 - 145 mmol/L 140   CANCELED  142   Potassium 3.5 - 5.1 mmol/L 3.7  CANCELED  4.7   Chloride 98 - 111 mmol/L 108  CANCELED  105   CO2 22 - 32 mmol/L 23  CANCELED  30   Calcium  8.9 - 10.3 mg/dL 8.4  CANCELED  9.4    Lipid Panel     Component Value Date/Time   CHOL 231 (H) 11/20/2022 0204   CHOL 263 (H) 06/06/2021 0935   TRIG 47 11/20/2022 0204   HDL 62 11/20/2022 0204   HDL 82 06/06/2021 0935   CHOLHDL 3.7 11/20/2022 0204   VLDL 9 11/20/2022 0204   LDLCALC 160 (H) 11/20/2022 0204   LDLCALC 172 (H) 06/06/2021 0935    CBC    Component Value Date/Time   WBC 4.4 11/20/2022 0414   RBC 3.72 (L) 11/20/2022 0414   HGB 10.2 (L) 11/20/2022 0414   HGB CANCELED 11/18/2022 0855   HCT 30.7 (L) 11/20/2022 0414   HCT CANCELED 11/18/2022 0855   PLT 74 (L) 11/20/2022 0414   PLT CANCELED 11/18/2022 0855   MCV 82.5 11/20/2022 0414   MCV CANCELED 11/18/2022 0855   MCH 27.4 11/20/2022 0414   MCHC 33.2 11/20/2022 0414   RDW 13.4 11/20/2022 0414   RDW CANCELED 11/18/2022 0855   LYMPHSABS 0.9 06/06/2021 0935   MONOABS 0.3 11/26/2020 1511   EOSABS 0.1 06/06/2021 0935   BASOSABS 0.1 06/06/2021 0935    ASSESSMENT AND PLAN: 1. Essential hypertension (Primary) Blood pressure today is not at goal but repeat reading was better.  She will continue the enalapril  20 mg twice a day and metoprolol  25 mg twice a day.  I think she would benefit from a diuretic given the lower extremity edema but patient declines.  She is intolerant of several medications in the past including amlodipine , carvedilol , hydralazine , indapamide ,  labetalol , lisinopril /hydrochlorothiazide . - CBC - Comprehensive metabolic panel with GFR - Lipid panel - enalapril  (VASOTEC ) 20 MG tablet; Take 1 tablet (20 mg total) by mouth 2 (two) times daily.  Dispense: 180 tablet; Refill: 2 - metoprolol  tartrate (LOPRESSOR ) 25 MG tablet; Take 1 tablet (25 mg total) by mouth 2 (two) times daily.  Dispense: 180 tablet; Refill: 3  2. Anxiety disorder, unspecified  type Continue low-dose Lexapro .  Patient has been on it for a little over a month and reports that she is benefiting from it.     Patient was given the opportunity to ask questions.  Patient verbalized understanding of the plan and was able to repeat key elements of the plan.   This documentation was completed using Paediatric nurse.  Any transcriptional errors are unintentional.  Orders Placed This Encounter  Procedures   CBC   Comprehensive metabolic panel with GFR   Lipid panel     Requested Prescriptions   Signed Prescriptions Disp Refills   enalapril  (VASOTEC ) 20 MG tablet 180 tablet 2    Sig: Take 1 tablet (20 mg total) by mouth 2 (two) times daily.   metoprolol  tartrate (LOPRESSOR ) 25 MG tablet 180 tablet 3    Sig: Take 1 tablet (25 mg total) by mouth 2 (two) times daily.    Return in about 4 months (around 04/11/2024).  Concetta Dee, MD, FACP

## 2023-12-11 ENCOUNTER — Ambulatory Visit: Payer: Self-pay | Admitting: Internal Medicine

## 2023-12-11 LAB — CBC
Hematocrit: 37.6 % (ref 34.0–46.6)
Hemoglobin: 12.4 g/dL (ref 11.1–15.9)
MCH: 27.6 pg (ref 26.6–33.0)
MCHC: 33 g/dL (ref 31.5–35.7)
MCV: 84 fL (ref 79–97)
Platelets: 93 10*3/uL — CL (ref 150–450)
RBC: 4.49 x10E6/uL (ref 3.77–5.28)
RDW: 13.6 % (ref 11.7–15.4)
WBC: 6.3 10*3/uL (ref 3.4–10.8)

## 2023-12-11 LAB — COMPREHENSIVE METABOLIC PANEL WITH GFR
ALT: 11 IU/L (ref 0–32)
AST: 19 IU/L (ref 0–40)
Albumin: 4.3 g/dL (ref 3.8–4.8)
Alkaline Phosphatase: 97 IU/L (ref 44–121)
BUN/Creatinine Ratio: 14 (ref 12–28)
BUN: 16 mg/dL (ref 8–27)
Bilirubin Total: 0.3 mg/dL (ref 0.0–1.2)
CO2: 20 mmol/L (ref 20–29)
Calcium: 9.4 mg/dL (ref 8.7–10.3)
Chloride: 102 mmol/L (ref 96–106)
Creatinine, Ser: 1.11 mg/dL — ABNORMAL HIGH (ref 0.57–1.00)
Globulin, Total: 3.2 g/dL (ref 1.5–4.5)
Glucose: 126 mg/dL — ABNORMAL HIGH (ref 70–99)
Potassium: 4 mmol/L (ref 3.5–5.2)
Sodium: 141 mmol/L (ref 134–144)
Total Protein: 7.5 g/dL (ref 6.0–8.5)
eGFR: 51 mL/min/{1.73_m2} — ABNORMAL LOW (ref >59)

## 2023-12-11 LAB — LIPID PANEL
Chol/HDL Ratio: 3.4 ratio (ref 0.0–4.4)
Cholesterol, Total: 284 mg/dL — ABNORMAL HIGH (ref 100–199)
HDL: 84 mg/dL (ref >39)
LDL Chol Calc (NIH): 182 mg/dL — ABNORMAL HIGH (ref 0–99)
Triglycerides: 106 mg/dL (ref 0–149)
VLDL Cholesterol Cal: 18 mg/dL (ref 5–40)

## 2023-12-13 ENCOUNTER — Other Ambulatory Visit: Payer: Self-pay | Admitting: Internal Medicine

## 2023-12-13 DIAGNOSIS — D696 Thrombocytopenia, unspecified: Secondary | ICD-10-CM

## 2023-12-22 ENCOUNTER — Other Ambulatory Visit: Payer: Self-pay | Admitting: Cardiovascular Disease

## 2023-12-22 DIAGNOSIS — I10 Essential (primary) hypertension: Secondary | ICD-10-CM

## 2024-02-01 ENCOUNTER — Encounter: Payer: Self-pay | Admitting: *Deleted

## 2024-02-02 ENCOUNTER — Encounter (HOSPITAL_BASED_OUTPATIENT_CLINIC_OR_DEPARTMENT_OTHER): Payer: Self-pay | Admitting: Cardiovascular Disease

## 2024-02-02 ENCOUNTER — Ambulatory Visit (HOSPITAL_BASED_OUTPATIENT_CLINIC_OR_DEPARTMENT_OTHER): Admitting: Cardiovascular Disease

## 2024-02-02 VITALS — BP 164/84 | HR 64 | Ht 64.0 in | Wt 171.0 lb

## 2024-02-02 DIAGNOSIS — Z5181 Encounter for therapeutic drug level monitoring: Secondary | ICD-10-CM

## 2024-02-02 DIAGNOSIS — I10 Essential (primary) hypertension: Secondary | ICD-10-CM

## 2024-02-02 DIAGNOSIS — E782 Mixed hyperlipidemia: Secondary | ICD-10-CM

## 2024-02-02 MED ORDER — ESCITALOPRAM OXALATE 10 MG PO TABS: 10.0000 mg | ORAL_TABLET | Freq: Every day | ORAL | 1 refills | Status: DC

## 2024-02-02 MED ORDER — CHLORTHALIDONE 25 MG PO TABS: 12.5000 mg | ORAL_TABLET | Freq: Every day | ORAL | 3 refills | Status: DC

## 2024-02-02 NOTE — Progress Notes (Signed)
 [facility] Follow-up:   Date:  02/14/2024   ID:  Theresa [name], DOB 1945/11/06, MRN [id number]  PCP:  Vicci Barnie NOVAK, MD  Cardiologist:  None  Electrophysiologist:  None   Evaluation Performed:  Follow-Up Visit  Chief Complaint:  Hypertension.  History of Present Illness:    Theresa [name] is a 78 y.o. female with a hx of hypertension, hyperlipidemia, palpitations, ITP, anxiety, here for follow-up. She was initially seen 06/26/2022 in the [facility]. She saw Dr. Vicci 11/2021 and her BP in the office was 170/90 on enalapril  and metoprolol . She reported that her home blood pressures were controlled. Dr. Vicci increased her enalapril  but she reduced it back due to palpitations. She was started on a clonidine  patch and referred to the [facility].  Her BP was uncontrolled both at home (145-170 systolic) and in the office (166/82), with superimposed white coat hypertension. She has struggled with intolerances to multiple medications. We added 1.25 mg indapamide  daily. Renal artery dopplers were repeated 06/2022 showing 1-59% stenosis of the right renal artery, and no evidence of stenosis in the left. On 06/30/2022 she reported swelling of her tongue and sore throat which she attributed to indapamide  so it was discontinued. Her blood pressures remained above goal 07/2022 so 2 mg doxazosin  was added. She was concerned that doxazosin  was causing palpitations and mouth swelling. She was advised to try holding doxazosin  for 1 week. Her symptoms improved so she did not resume doxazosin .  We had a telephone visit 08/2022. She reported home blood pressures averaging 150s-160s in the setting of multiple life stressors. We asked our care guide to reach out to her for stress management. She was referred for renal denervation which was performed 11/19/2022 with Dr. Darron and was successful in the bilateral renal arteries.  At her visit 11/2022 BP was  204/96.  BP at home averaged 140-160s on metoprolol  and enalapril .  She was struggling with stress.  At follow up 10/2023 home BP ranged 130-170s.  She continued to struggle with anxiety.  She was started on Lexapro  and encouraged to exercise.  She was referred to grief counseling.  Discussed the use of AI scribe software for clinical note transcription with the patient, who gave verbal consent to proceed.  History of Present Illness  Theresa [name] experiences fluctuating blood pressure readings at home, ranging from 137/89 mmHg to 190/90 mmHg. Her blood pressure tends to be higher in the office, with a recent reading of 162/87 mmHg. She is currently on metoprolol  and enalapril  for hypertension management.  She takes medication for anxiety, which she finds helpful, though she feels it may need an increase in dosage. No chest pain or issues with breathing during physical activity.  Her cholesterol levels were checked in June, revealing high levels with an LDL of 181 mg/dL. She was not fasting at the time of the test.  She mentions limited physical activity, stating she does not engage in much exercise. She occasionally walks with her granddaughter, who is active and goes to the gym.  She plans to travel in August to support her daughter, who is managing a large family. She intends to return but is uncertain about the exact timeline.  Previous antihypertensives: Clonidine  patch (palpitations) Amlodipine  (swelling) Candesartan  (palpitations) Carvedilol  (excessive drowsiness, dizziness and palpitations) Chlorthalidone  (palpitations, dizziness) PO clonidine  (dizziness)  HCTZ (dizziness, palpitations)  Hydralazine  (palpitations) lisinopril  (palpitatons) Lisinopril -HCTZ (flushing and insomnia) Spironolactone  (dizziness, palpitations) Valsartan  (palpitations) Indapamide  (sore throat, tongue swelling) Doxazosin  (  palpitations, mouth lesions/swelling)  Past Medical History:  Diagnosis Date    Anxiety 12/17/2022   Gastric ulcer    Hyperlipidemia    Hypertension    River blindness    Sinusitis     Past Surgical History:  Procedure Laterality Date   APPENDECTOMY     BREAST LUMPECTOMY     CATARACT EXTRACTION Left 01/2020   RENAL DENERVATION N/A 11/19/2022   Procedure: RENAL DENERVATION;  Surgeon: Darron Deatrice LABOR, MD;  Location: [facility];  Service: Cardiovascular;  Laterality: N/A;    Current Medications: Current Meds  Medication Sig   acetaminophen  (TYLENOL ) 500 MG tablet Take 500 mg by mouth every 6 (six) hours as needed for mild pain or moderate pain.   ascorbic acid (VITAMIN C) 500 MG tablet Take 500 mg by mouth 2 (two) times daily.   b complex vitamins capsule Take 1 capsule by mouth 2 (two) times a week.   bimatoprost  (LUMIGAN ) 0.01 % SOLN Place 1 drop into both eyes at bedtime.   Cholecalciferol (VITAMIN D3 PO) Take 1 tablet by mouth 3 (three) times a week.   COMBIGAN  0.2-0.5 % ophthalmic solution Place 1 drop into both eyes 2 (two) times daily.   enalapril  (VASOTEC ) 20 MG tablet Take 1 tablet (20 mg total) by mouth 2 (two) times daily.   folic acid  (FOLVITE ) 400 MCG tablet Take 400 mcg by mouth daily.   metoprolol  tartrate (LOPRESSOR ) 25 MG tablet Take 1 tablet (25 mg total) by mouth 2 (two) times daily.   Multiple Vitamin (MULTIVITAMIN) tablet Take 1 tablet by mouth 2 (two) times a week.   Polyvinyl Alcohol -Povidone PF (REFRESH) 1.4-0.6 % SOLN Place 1 drop into both eyes at bedtime.   [DISCONTINUED] bimatoprost  (LUMIGAN ) 0.01 % SOLN Instill 1 drop into both eyes every night   [DISCONTINUED] brimonidine -timolol  (COMBIGAN ) 0.2-0.5 % ophthalmic solution Instill 1 drop into both eyes twice a day   [DISCONTINUED] chlorthalidone  (HYGROTON ) 25 MG tablet Take 0.5 tablets (12.5 mg total) by mouth daily.   [DISCONTINUED] enalapril  (VASOTEC ) 10 MG tablet Take 10 mg by mouth 2 (two) times daily.   [DISCONTINUED] escitalopram  (LEXAPRO ) 5 MG tablet Take 1 tablet (5 mg  total) by mouth daily.     Allergies:   Amlodipine , Coreg  [carvedilol ], Shellfish allergy, Trazodone  and nefazodone, Hydralazine  hcl, Augmentin [amoxicillin-pot clavulanate], Cardura  [doxazosin ], Chlorthalidone , Indapamide , Labetalol , Clonidine  derivatives, Lisinopril , and Lisinopril -hydrochlorothiazide    Social History   Socioeconomic History   Marital status: Widowed    Spouse name: Not on file   Number of children: 2   Years of education: Not on file   Highest education level: Not on file  Occupational History   Not on file  Tobacco Use   Smoking status: Never   Smokeless tobacco: Never  Vaping Use   Vaping status: Never Used  Substance and Sexual Activity   Alcohol  use: No   Drug use: No   Sexual activity: Not Currently    Birth control/protection: Post-menopausal  Other Topics Concern   Not on file  Social History Narrative   Not on file   Social Drivers of Health   Financial Resource Strain: Low Risk  (12/10/2023)   Overall Financial Resource Strain (CARDIA)    Difficulty of Paying Living Expenses: Not hard at all  Food Insecurity: No Food Insecurity (12/10/2023)   Hunger Vital Sign    Worried About Running Out of Food in the Last Year: Never true    Ran Out of Food in the Last  Year: Never true  Transportation Needs: Unmet Transportation Needs (12/10/2023)   PRAPARE - Transportation    Lack of Transportation (Medical): No    Lack of Transportation (Non-Medical): Yes  Physical Activity: Insufficiently Active (12/10/2023)   Exercise Vital Sign    Days of Exercise per Week: 4 days    Minutes of Exercise per Session: 20 min  Stress: Stress Concern Present (12/10/2023)   [employer] of Occupational Health - Occupational Stress Questionnaire    Feeling of Stress : To some extent  Social Connections: Moderately Integrated (12/10/2023)   Social Connection and Isolation Panel    Frequency of Communication with Friends and Family: More than three times a week     Frequency of Social Gatherings with Friends and Family: More than three times a week    Attends Religious Services: 1 to 4 times per year    Active Member of [employer] or Organizations: Yes    Attends Banker Meetings: More than 4 times per year    Marital Status: Widowed     Family History: The patient's family history includes Cancer in her brother; Hypertension in her mother.  ROS:   Please see the history of present illness.    (+) Headache (+) Tenseness of shoulders (+) Stress All other systems reviewed and are negative.  EKGs/Labs/Other Studies Reviewed:    Renal Denervation  11/19/2022: 1. 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 1 extra accessory artery on each side were not treated due to vessel diameter less than 3 mm.   2.  Difficult procedure overall due to unusual anterior takeoff of the right renal artery with difficult engagement as well as significant tortuosity of the renal arteries.   Recommendations: Due to excessive contrast use and length of procedure, will observe overnight and hydrate to decrease the chance of contrast-induced nephropathy.  Bilateral Renal Artery Dopplers  07/23/2022: Summary:  Renal:    Right: Normal size right kidney. RRV flow present. 1-59% stenosis of         the right renal artery. Normal cortical thickness of right         kidney. Abnormal right Resistive Index.  Left:  Normal size of left kidney. LRV flow present. No evidence of         left renal artery stenosis. Normal cortical thickness of the         left kidney. Abnormal left Resisitve Index.   Bilateral Renal Artery Dopplers  11/01/2015: Summary:  Findings suggest 1-59% renal artery stenosis. There is evidence of  elevated resistive indices in bilateral intrarenal arteries.  Incidental finding: there is evidence of multiple simple cysts in  the left kidney, largest measuring 1.14cm.   EKG:  EKG is personally  reviewed. 12/17/2022:  EKG was not ordered. 09/10/2022:  EKG was not ordered. 06/26/2022: Sinus rhythm. Rate 72 bpm. LAFB.  Recent Labs: 12/10/2023: Hemoglobin 12.4; Platelets 93 02/11/2024: ALT 10; BUN 15; Creatinine, Ser 0.96; Potassium 4.5; Sodium 142   Recent Lipid Panel    Component Value Date/Time   CHOL 289 (H) 02/11/2024 1126   TRIG 75 02/11/2024 1126   HDL 83 02/11/2024 1126   CHOLHDL 3.5 02/11/2024 1126   CHOLHDL 3.7 11/20/2022 0204   VLDL 9 11/20/2022 0204   LDLCALC 194 (H) 02/11/2024 1126    Physical Exam:    VS:  BP (!) 164/84   Pulse 64   Ht 5' 4 (1.626 m)   Wt 171 lb (77.6 kg)  SpO2 99%   BMI 29.35 kg/m  , BMI Body mass index is 29.35 kg/m. GENERAL:  Well appearing HEENT: Pupils equal round and reactive, fundi not visualized, oral mucosa unremarkable NECK:  No jugular venous distention, waveform within normal limits, carotid upstroke brisk and symmetric, no bruits, no thyromegal LUNGS:  Clear to auscultation bilaterally HEART:  RRR.  PMI not displaced or sustained,S1 and S2 within normal limits, no S3, no S4, no clicks, no rubs, no murmurs ABD:  Flat, positive bowel sounds normal in frequency in pitch, no bruits, no rebound, no guarding, no midline pulsatile mass, no hepatomegaly, no splenomegaly EXT:  2 plus pulses throughout, no edema, no cyanosis no clubbing SKIN:  No rashes no nodules NEURO:  Cranial nerves II through XII grossly intact, motor grossly intact throughout PSYCH:  Cognitively intact, oriented to person place and time  ASSESSMENT/PLAN:    Assessment & Plan # Hypertension Hypertension remains uncontrolled with home readings 137/89 to 190/90 and office reading 164/84. Current medications: metoprolol , enalapril . - Initiate chlorthalidone  12.5 mg daily. - Perform BMP in one week to monitor potassium and other electrolytes.  # Hyperlipidemia Recent non-fasting cholesterol levels high with LDL 181 mg/dL.  Unclear whether this was fasting.  If  still elevated, will need statin.  - Order fasting lipid panel in one week.  # Anxiety Anxiety managed with Lexapro , patient desires increased dose for better control. - Increase Lexapro  to 10 mg daily.   Screening for Secondary Hypertension:     06/26/2022    2:42 PM  Causes  Drugs/Herbals Screened     - Comments limiting white salt but uses Himalayan salt, no EtOH  Renovascular HTN Screened     - Comments Mild renal artery steonsis.    Relevant Labs/Studies:    Latest Ref Rng & Units 02/11/2024   11:26 AM 12/10/2023    4:49 PM 11/20/2022    2:04 AM  Basic Labs  Sodium 134 - 144 mmol/L 142  141  140   Potassium 3.5 - 5.2 mmol/L 4.5  4.0  3.7   Creatinine 0.57 - 1.00 mg/dL 9.03  8.88  9.07        Latest Ref Rng & Units 07/04/2022    8:10 AM 08/30/2019    8:50 AM  Thyroid   TSH 0.450 - 4.500 uIU/mL 3.720  2.880        Latest Ref Rng & Units 11/26/2015    4:55 PM  Renin/Aldosterone   Aldosterone ng/dL 3              87/72/7976   12:22 PM  Renovascular   Renal Artery US  Completed Yes    Disposition:    FU 3 months.  Medication Adjustments/Labs and Tests Ordered: Current medicines are reviewed at length with the patient today.  Concerns regarding medicines are outlined above.   Orders Placed This Encounter  Procedures   Lipid panel   Comprehensive metabolic panel with GFR   Meds ordered this encounter  Medications   DISCONTD: chlorthalidone  (HYGROTON ) 25 MG tablet    Sig: Take 0.5 tablets (12.5 mg total) by mouth daily.    Dispense:  45 tablet    Refill:  3   escitalopram  (LEXAPRO ) 10 MG tablet    Sig: Take 1 tablet (10 mg total) by mouth daily.    Dispense:  90 tablet    Refill:  1    NEW DOSE, D/C 5 MG RX    Signed, Annabella Scarce, MD  02/14/2024 11:14  AM    [facility]

## 2024-02-02 NOTE — Patient Instructions (Addendum)
 Medication Instructions:  INCREASE LEXAPRO  TO 10 MG DAILY  ADDITIONAL REFILLS FROM PRIMARY CARE   START CHLORTHALIDONE  TO 12.5 MG DAILY (1/2 OF A 25 MG TABLET)   Labwork: FASTING LP/CMET IN ABOUT 1 WEEK   Testing/Procedures: NONE  Follow-Up: 6 MONTHS WITH DR [place], CAITLIN W NP, OR KRISTIN A PHARM D   If you need a refill on your cardiac medications before your next appointment, please call your pharmacy.

## 2024-02-10 ENCOUNTER — Telehealth: Payer: Self-pay | Admitting: Cardiovascular Disease

## 2024-02-10 ENCOUNTER — Encounter: Payer: Self-pay | Admitting: Cardiovascular Disease

## 2024-02-10 NOTE — Telephone Encounter (Signed)
 This encounter was created in error - please disregard.

## 2024-02-10 NOTE — Telephone Encounter (Signed)
   Pt c/o of Chest Pain: STAT if active CP, including tightness, pressure, jaw pain, radiating pain to shoulder/upper arm/back, CP unrelieved by Nitro. Symptoms reported of SOB, nausea, vomiting, sweating.  1. Are you having CP right now? No - This morning    2. Are you experiencing any other symptoms (ex. SOB, nausea, vomiting, sweating)? No   3. Is your CP continuous or coming and going? Come and go   4. Have you taken Nitroglycerin ? No   5. How long have you been experiencing CP? Weak and dizzy    6. If NO CP at time of call then end call with telling Pt to call back or call 911 if Chest pain returns prior to return call from triage team.

## 2024-02-10 NOTE — Telephone Encounter (Signed)
 Pt c/o medication issue:  1. Name of Medication: Chlorthalidone   2. How are you currently taking this medication (dosage and times per day)?   3. Are you having a reaction (difficulty breathing--STAT)?   4. What is your medication issue? Patients say she is having problems with this medicine

## 2024-02-10 NOTE — Telephone Encounter (Addendum)
 Spoke with patient regarding chest pain, woke up this am with chest pain that lasted a couple of minutes Denied shortness of breath  She had 2 episodes of palpitations today, has had in the past when she has taken medications that didn't agree with her Blood pressure 110/70 this am, felt dizzy and weak with no energy  Concerned blood pressure too low, advised the readings was good blood pressure  Had patient recheck blood pressure was 151/87, stated she was feeling better  Patient does NOT want to continue the Chlorthalidone , thinks causing her issues and making her skin dry Advised patient needs to get follow up labs done tomorrow, agreed to get  Will forward to Dr Raford and Pharm D for review

## 2024-02-11 NOTE — Telephone Encounter (Signed)
 Multiple symptoms, recommend she come in for office visit

## 2024-02-12 LAB — COMPREHENSIVE METABOLIC PANEL WITH GFR
ALT: 10 IU/L (ref 0–32)
AST: 19 IU/L (ref 0–40)
Albumin: 4.1 g/dL (ref 3.8–4.8)
Alkaline Phosphatase: 78 IU/L (ref 44–121)
BUN/Creatinine Ratio: 16 (ref 12–28)
BUN: 15 mg/dL (ref 8–27)
Bilirubin Total: 0.4 mg/dL (ref 0.0–1.2)
CO2: 24 mmol/L (ref 20–29)
Calcium: 9.4 mg/dL (ref 8.7–10.3)
Chloride: 101 mmol/L (ref 96–106)
Creatinine, Ser: 0.96 mg/dL (ref 0.57–1.00)
Globulin, Total: 3.6 g/dL (ref 1.5–4.5)
Glucose: 90 mg/dL (ref 70–99)
Potassium: 4.5 mmol/L (ref 3.5–5.2)
Sodium: 142 mmol/L (ref 134–144)
Total Protein: 7.7 g/dL (ref 6.0–8.5)
eGFR: 60 mL/min/1.73 (ref >59)

## 2024-02-12 LAB — LIPID PANEL
Chol/HDL Ratio: 3.5 ratio (ref 0.0–4.4)
Cholesterol, Total: 289 mg/dL — ABNORMAL HIGH (ref 100–199)
HDL: 83 mg/dL (ref >39)
LDL Chol Calc (NIH): 194 mg/dL — ABNORMAL HIGH (ref 0–99)
Triglycerides: 75 mg/dL (ref 0–149)
VLDL Cholesterol Cal: 12 mg/dL (ref 5–40)

## 2024-02-14 ENCOUNTER — Encounter (HOSPITAL_BASED_OUTPATIENT_CLINIC_OR_DEPARTMENT_OTHER): Payer: Self-pay | Admitting: Cardiovascular Disease

## 2024-02-15 NOTE — Telephone Encounter (Addendum)
 Offered patient an appointment today with Reche ORN NP, patent declined

## 2024-02-20 ENCOUNTER — Ambulatory Visit: Payer: Self-pay | Admitting: Cardiovascular Disease

## 2024-04-11 ENCOUNTER — Ambulatory Visit: Admitting: Internal Medicine

## 2024-04-15 ENCOUNTER — Other Ambulatory Visit: Payer: Self-pay | Admitting: Internal Medicine

## 2024-07-06 ENCOUNTER — Encounter (HOSPITAL_BASED_OUTPATIENT_CLINIC_OR_DEPARTMENT_OTHER): Payer: Self-pay | Admitting: Cardiovascular Disease

## 2024-08-05 ENCOUNTER — Other Ambulatory Visit (HOSPITAL_BASED_OUTPATIENT_CLINIC_OR_DEPARTMENT_OTHER): Payer: Self-pay | Admitting: Cardiovascular Disease
